# Patient Record
Sex: Male | Born: 1960
Health system: Southern US, Community
[De-identification: ages and names within clinical notes are randomized; demographics above are authoritative.]

## PROBLEM LIST (undated history)

## (undated) DIAGNOSIS — E785 Hyperlipidemia, unspecified: Secondary | ICD-10-CM

## (undated) DIAGNOSIS — I509 Heart failure, unspecified: Secondary | ICD-10-CM

## (undated) DIAGNOSIS — I10 Essential (primary) hypertension: Secondary | ICD-10-CM

## (undated) DIAGNOSIS — I6529 Occlusion and stenosis of unspecified carotid artery: Secondary | ICD-10-CM

## (undated) DIAGNOSIS — Z8673 Personal history of transient ischemic attack (TIA), and cerebral infarction without residual deficits: Secondary | ICD-10-CM

## (undated) DIAGNOSIS — E119 Type 2 diabetes mellitus without complications: Secondary | ICD-10-CM

## (undated) DIAGNOSIS — Z720 Tobacco use: Secondary | ICD-10-CM

## (undated) DIAGNOSIS — Z8659 Personal history of other mental and behavioral disorders: Secondary | ICD-10-CM

## (undated) DIAGNOSIS — B1920 Unspecified viral hepatitis C without hepatic coma: Secondary | ICD-10-CM

## (undated) DIAGNOSIS — I639 Cerebral infarction, unspecified: Secondary | ICD-10-CM

## (undated) HISTORY — DX: Tobacco use: Z72.0

## (undated) HISTORY — DX: Personal history of transient ischemic attack (TIA), and cerebral infarction without residual deficits: Z86.73

## (undated) HISTORY — DX: Hyperlipidemia, unspecified: E78.5

## (undated) HISTORY — PX: NO PAST SURGERIES: SHX2092

## (undated) HISTORY — DX: Essential (primary) hypertension: I10

## (undated) HISTORY — DX: Personal history of other mental and behavioral disorders: Z86.59

## (undated) HISTORY — DX: Occlusion and stenosis of unspecified carotid artery: I65.29

## (undated) HISTORY — DX: Heart failure, unspecified: I50.9

---
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
## 2000-07-16 ENCOUNTER — Emergency Department (HOSPITAL_COMMUNITY): Admission: EM | Admit: 2000-07-16 | Discharge: 2000-07-16 | Payer: Self-pay | Admitting: Emergency Medicine

## 2000-07-16 ENCOUNTER — Encounter: Payer: Self-pay | Admitting: Emergency Medicine

## 2000-08-07 ENCOUNTER — Encounter: Admission: RE | Admit: 2000-08-07 | Discharge: 2000-08-07 | Payer: Self-pay | Admitting: Internal Medicine

## 2001-08-13 ENCOUNTER — Emergency Department (HOSPITAL_COMMUNITY): Admission: EM | Admit: 2001-08-13 | Discharge: 2001-08-13 | Payer: Self-pay | Admitting: Emergency Medicine

## 2001-12-11 ENCOUNTER — Emergency Department (HOSPITAL_COMMUNITY): Admission: EM | Admit: 2001-12-11 | Discharge: 2001-12-11 | Payer: Self-pay | Admitting: Emergency Medicine

## 2008-01-23 ENCOUNTER — Emergency Department (HOSPITAL_COMMUNITY): Admission: EM | Admit: 2008-01-23 | Discharge: 2008-01-23 | Payer: Self-pay | Admitting: Emergency Medicine

## 2010-02-06 DIAGNOSIS — Z8673 Personal history of transient ischemic attack (TIA), and cerebral infarction without residual deficits: Secondary | ICD-10-CM | POA: Insufficient documentation

## 2010-02-06 HISTORY — DX: Personal history of transient ischemic attack (TIA), and cerebral infarction without residual deficits: Z86.73

## 2010-02-26 ENCOUNTER — Inpatient Hospital Stay (HOSPITAL_COMMUNITY): Admission: EM | Admit: 2010-02-26 | Discharge: 2010-03-02 | Payer: Self-pay | Admitting: Emergency Medicine

## 2010-03-01 ENCOUNTER — Encounter (INDEPENDENT_AMBULATORY_CARE_PROVIDER_SITE_OTHER): Payer: Self-pay | Admitting: Internal Medicine

## 2010-03-05 ENCOUNTER — Emergency Department (HOSPITAL_COMMUNITY): Admission: EM | Admit: 2010-03-05 | Discharge: 2010-03-05 | Payer: Self-pay | Admitting: Emergency Medicine

## 2010-04-14 ENCOUNTER — Encounter
Admission: RE | Admit: 2010-04-14 | Discharge: 2010-05-06 | Payer: Self-pay | Source: Home / Self Care | Attending: Internal Medicine | Admitting: Internal Medicine

## 2010-05-06 ENCOUNTER — Encounter
Admission: RE | Admit: 2010-05-06 | Discharge: 2010-06-08 | Payer: Self-pay | Source: Home / Self Care | Attending: Internal Medicine | Admitting: Internal Medicine

## 2010-05-09 DIAGNOSIS — I639 Cerebral infarction, unspecified: Secondary | ICD-10-CM

## 2010-05-09 HISTORY — DX: Cerebral infarction, unspecified: I63.9

## 2010-06-01 ENCOUNTER — Inpatient Hospital Stay (HOSPITAL_COMMUNITY)
Admission: EM | Admit: 2010-06-01 | Discharge: 2010-06-03 | Payer: Self-pay | Source: Home / Self Care | Attending: Internal Medicine | Admitting: Internal Medicine

## 2010-06-02 LAB — COMPREHENSIVE METABOLIC PANEL
AST: 25 U/L (ref 0–37)
Albumin: 4.4 g/dL (ref 3.5–5.2)
Alkaline Phosphatase: 63 U/L (ref 39–117)
Alkaline Phosphatase: 56 U/L (ref 39–117)
BUN: 6 mg/dL (ref 6–23)
BUN: 6 mg/dL (ref 6–23)
CO2: 26 mEq/L (ref 19–32)
Chloride: 99 mEq/L (ref 96–112)
Creatinine, Ser: 0.87 mg/dL (ref 0.4–1.5)
Creatinine, Ser: 0.93 mg/dL (ref 0.4–1.5)
GFR calc non Af Amer: >60 mL/min (ref >60)
Glucose, Bld: 197 mg/dL — ABNORMAL HIGH (ref 70–99)
Potassium: 3.3 mEq/L — ABNORMAL LOW (ref 3.5–5.1)
Potassium: 3.4 mEq/L — ABNORMAL LOW (ref 3.5–5.1)
Total Bilirubin: 0.4 mg/dL (ref 0.3–1.2)
Total Protein: 7.2 g/dL (ref 6.0–8.3)

## 2010-06-02 LAB — URINALYSIS, ROUTINE W REFLEX MICROSCOPIC
Bilirubin Urine: NEGATIVE
Hgb urine dipstick: NEGATIVE
Ketones, ur: NEGATIVE mg/dL
Specific Gravity, Urine: 1.001 — ABNORMAL LOW (ref 1.005–1.030)
Urine Glucose, Fasting: NEGATIVE mg/dL
pH: 6.5 (ref 5.0–8.0)

## 2010-06-02 LAB — CBC
HCT: 41 % (ref 39.0–52.0)
Hemoglobin: 13 g/dL (ref 13.0–17.0)
MCH: 31.1 pg (ref 26.0–34.0)
MCHC: 34.3 g/dL (ref 30.0–36.0)
MCV: 89.1 fL (ref 78.0–100.0)
Platelets: 275 10*3/uL (ref 150–400)
RBC: 4.24 MIL/uL (ref 4.22–5.81)
RDW: 13.2 % (ref 11.5–15.5)
WBC: 8.6 10*3/uL (ref 4.0–10.5)
WBC: 5.2 10*3/uL (ref 4.0–10.5)

## 2010-06-02 LAB — CARDIAC PANEL(CRET KIN+CKTOT+MB+TROPI)
CK, MB: 1.2 ng/mL (ref 0.3–4.0)
Total CK: 160 U/L (ref 7–232)
Troponin I: <0.01 ng/mL (ref 0.00–0.06)

## 2010-06-02 LAB — DIFFERENTIAL
Eosinophils Absolute: 0.1 10*3/uL (ref 0.0–0.7)
Eosinophils Relative: 2 % (ref 0–5)
Lymphocytes Relative: 40 % (ref 12–46)
Lymphs Abs: 3.4 10*3/uL (ref 0.7–4.0)
Monocytes Absolute: 0.5 10*3/uL (ref 0.1–1.0)
Monocytes Relative: 6 % (ref 3–12)

## 2010-06-02 LAB — HEMOGLOBIN A1C
Hgb A1c MFr Bld: 6 % — ABNORMAL HIGH (ref <5.7)
Mean Plasma Glucose: 126 mg/dL — ABNORMAL HIGH (ref <117)

## 2010-06-02 LAB — LIPID PANEL
Cholesterol: 160 mg/dL (ref 0–200)
HDL: 32 mg/dL — ABNORMAL LOW (ref >39)
LDL Cholesterol: 101 mg/dL — ABNORMAL HIGH (ref 0–99)
Total CHOL/HDL Ratio: 5 RATIO
Triglycerides: 135 mg/dL (ref <150)

## 2010-06-02 LAB — RAPID URINE DRUG SCREEN, HOSP PERFORMED
Amphetamines: NOT DETECTED
Cocaine: NOT DETECTED
Opiates: NOT DETECTED
Tetrahydrocannabinol: NOT DETECTED

## 2010-06-02 LAB — MAGNESIUM: Magnesium: 1.9 mg/dL (ref 1.5–2.5)

## 2010-06-02 LAB — GLUCOSE, CAPILLARY

## 2010-06-02 LAB — CK TOTAL AND CKMB (NOT AT ARMC): CK, MB: 1.8 ng/mL (ref 0.3–4.0)

## 2010-06-02 LAB — APTT: aPTT: 30 seconds (ref 24–37)

## 2010-06-02 LAB — POCT CARDIAC MARKERS

## 2010-06-03 ENCOUNTER — Encounter: Admit: 2010-06-03 | Payer: Self-pay | Admitting: Internal Medicine

## 2010-06-03 LAB — GLUCOSE, CAPILLARY: Glucose-Capillary: 113 mg/dL — ABNORMAL HIGH (ref 70–99)

## 2010-06-03 LAB — BASIC METABOLIC PANEL
CO2: 29 mEq/L (ref 19–32)
Glucose, Bld: 124 mg/dL — ABNORMAL HIGH (ref 70–99)
Potassium: 3.8 mEq/L (ref 3.5–5.1)
Sodium: 142 mEq/L (ref 135–145)

## 2010-06-04 ENCOUNTER — Encounter: Admit: 2010-06-04 | Payer: Self-pay | Admitting: Internal Medicine

## 2010-06-09 NOTE — H&P (Signed)
NAME:  MICHAE, GRIMLEY NO.:  [id number]  MEDICAL RECORD NO.:  [id number]          PATIENT TYPE:  EMS  LOCATION:  MAJO                         FACILITY:  [facility]  PHYSICIAN:  Eduard Clos, MDDATE OF BIRTH:  [birth year]  DATE OF ADMISSION:  06/01/2010 DATE OF DISCHARGE:                             HISTORY & PHYSICAL   PRIMARY CARE PHYSICIAN:  [facility].  CHIEF COMPLAINT:  Right-sided weakness.  HISTORY OF PRESENT ILLNESS:  This is a 50 year old male with history of CVA in October 2011, at that time I had some residual right-sided upper and lower extremity weakness, was doing fine and around 6:00 p.m. today was feeling uneasy, uncomfortable had some cold, chills, and shivers and experienced some weakness over the right upper extremity more than usual and also he said he had difficulty walking with this, having to drag his legs.  Along with that, he also had difficulty swallowing and also talking.  The whole incident lasted around less than an hour, but more than half an hour.  He was brought to the ER, in the ER the patient was found to have high blood pressure and also had some residual right-sided weakness which he did have previously.  CT head was negative.  At this time, the patient admitted for further workup.  Dr. Debarah Crape of Neurology was consulted by the ER physician.  At this time, Dr. Debarah Crape has advised the patient is not a candidate for TPA, as the patient has a recent CVA and more over his symptoms are improving.  To admit him under hospitalist service.  Get an MRI of the brain.  The patient denies any chest pain, shortness of breath, nausea, vomiting, abdominal pain, dysuria, discharge, diarrhea.  Denies any visual symptoms.  Have some mild headache all over the head which is improving.  The patient has no symptoms on the left upper lower extremity.  At this time his right upper and lower extremity symptoms have almost come back to baseline.  PAST  MEDICAL HISTORY:  CVA in October 2011, hypertension, tobacco abuse, hyperlipidemia.  PAST SURGICAL HISTORY:  None.  MEDICATIONS ON ADMISSION:  He states that he still takes same medicine. He was discharged last time which includes. 1. Clonidine 0.1 mg p.o. twice daily. 2. Lovastatin 20 mg daily. 3. Zolpidem 5 mg as needed. 4. Amlodipine 10 mg daily. 5. Aspirin 325 daily.  ALLERGIES:  ACE INHIBITORS and ARB.  FAMILY HISTORY:  The patient's mom having hypertension and diabetes. Coronary disease in mom and leukemia in his dad.  SOCIAL HISTORY:  The patient smokes cigarettes and advised to quit smoking.  Denies any alcohol abuse.  I have abused marijuana occasionally.  Denies any IV drug abuse.  REVIEW OF SYSTEMS:  As per the history of presenting illness, nothing else significant.  PHYSICAL EXAMINATION:  GENERAL:  The patient examined at bedside, not in acute distress. VITAL SIGNS:  Blood pressure is 160/98, pulse is 96, temperature 97.8, respirations 18, O2 sat 100%. HEENT:  Anicteric.  No pallor.  No facial asymmetry.  Tongue is midline. No neck rigidity.  No discharge from ears, eyes, nose, or mouth.  PLA positive. CHEST:  Bilateral air entry present.  No rhonchi.  No crepitation. HEART:  S1 and S2 heard. GI:  Abdomen is soft, nontender.  Bowel sounds heard. CNS: The patient is alert, awake, oriented to time, place, and person. The patient has strength on the right upper extremity around 3-4/5. Strength in left upper extremity is 5/5.  The right lower extremity strength is 5/5.  Left lower extremity strength is 5/5.  There is no dysdiadochokinesia or ataxia. EXTREMITIES:  Peripheral pulses felt.  No edema.  LABORATORY DATA:  EKG shows normal sinus rhythm with nonspecific ST-T changes.  Heart rate is around 96 beats per minute with posterior AV block.  Chest x-ray shows nothing acute.  CT of the head without contrast shows no acute findings, old white matter infarction  within the left centrum semiovale right frontal and a point sinusitis present last October as well.  CBC; WBC is 8.6, hemoglobin is 14.8, hematocrit is 41, platelets 275.  PT/INR are 13.3 and 0.9.  CBG is 216.  Complete metabolic panel; sodium 139, potassium 3.3, chloride 99, carbon dioxide 26, glucose 197, BUN 6, creatinine 0.8.  Total bilirubin is 0.4, alkaline phosphatase is 63, AST is 25, ALT is 31, total protein is 8, albumin is 4.4, calcium is 9.5.  CK is 119, MB is 1.8, troponin less than 0.01, and troponin I is less than 0.05.  Myoglobin is 71.9.  Drug screen is negative.  ASSESSMENT: 1. Right-sided weakness with difficulty talking and swallowing which     is improved at this time to rule out cerebrovascular accident     versus transient ischemic attack. 2. Accelerated hypertension. 3. Hyperglycemia concerning for the diabetes mellitus type 2. 4. Hyperlipidemia. 5. Ongoing tobacco abuse. 6. History of marijuana abuse.  PLAN: 1. At this time admit the patient to the telemetry. 2. I have discussed with on-call neurologist, Dr. Debarah Crape who was advised     MRI of the brain.  The patient is refusing MRI of the brain even if     given Ativan for sedation, so at this time after discussing with     Dr. Debarah Crape we are going to do CT angio brain and neck.  The patient be     on neuro checks and to be getting swallow evaluation, given a     aspirin. 3. Accelerated hypertension.  We will continue his clonidine, but at     this time I want to increase 0.1 b.i.d. to t.i.d. p.r.n., labetalol     for blood pressure more than 180 systolic and continue his     amlodipine.  Given a stroke like symptoms.  We will not be too     aggressive in his blood pressure control for at least next 12-24     hours or until CVA is ruled out. 4. Hyperglycemia concerning for a diabetes mellitus type 2.  At this     time, the patient placed will be placed on a sliding scale.  We     will check a hemoglobin A1c.  If  he has been diagnosed as a     diabetes he will need diabetic     education. 5. Tobacco abuse and does not abuse counseling will be needed. 6. Further recommendation and condition were based on tests.     Eduard Clos, MD     ANK/MEDQ  D:  06/01/2010  T:  06/01/2010  Job:  [id number]  Electronically Signed by Midge Minium MD on  06/09/2010 10:04:34 AM

## 2010-06-11 ENCOUNTER — Encounter: Payer: Self-pay | Admitting: Rehabilitative and Restorative Service Providers"

## 2010-06-11 ENCOUNTER — Encounter: Payer: Self-pay | Admitting: Occupational Therapy

## 2010-06-16 ENCOUNTER — Encounter: Payer: Self-pay | Admitting: Occupational Therapy

## 2010-07-12 NOTE — Consult Note (Signed)
NAME:  Ronald Marsh, Ronald Marsh              ACCOUNT NO.:  [id number]  MEDICAL RECORD NO.:  [id number]          PATIENT TYPE:  INP  LOCATION:  3023                         FACILITY:  [facility]  PHYSICIAN:  [facility]      DATE OF BIRTH:  10-28-1960  DATE OF CONSULTATION:  06/02/2010 DATE OF DISCHARGE:                                CONSULTATION   CHIEF COMPLAINT:  Abnormal sensation on the right side of the body with right arm and leg spasm for 30 minutes.  HISTORY OF PRESENT ILLNESS:  A 50 year old male with hypertension, prediabetes, left periventricular subcortical ischemic infarction in October 2011 with residual right upper and lower extremity weakness and who was living at home, attending outpatient physical therapy.  On June 01, 2009, at around 6 o'clock p.m., the patient was feeling uneasy, felt cold sensation on the right side of his body and felt his right hand fallen to assist.  He also felt spasm in his right lower extremity.  This lasted for approximately 30 minutes.  He was brought to the emergency room, found to have elevated blood pressure, and admitted for further workup.  The patient was recommend to have MRI scan of the brain, however, he declined.  Presently, the patient feels back to his normal state of health.  He did have a CT angiogram of the head and neck, which I reviewed.  PAST MEDICAL HISTORY: 1. Left periventricular subcortical ischemic infarction in October     2011. 2. Hypertension. 3. Hyperlipidemia. 4. Prediabetes. 5. Tobacco abuse.  PAST SURGICAL HISTORY:  None.  HOME MEDICATIONS:  Clonidine 0.1 mg twice a day, lovastatin 20 mg daily, zolpidem 5 mg as needed, amlodipine 10 mg daily, and aspirin 325 mg daily.  ALLERGIES:  ACE inhibitors and ARBs.  FAMILY HISTORY:  Hypertension, diabetes, coronary artery disease, and leukemia.  SOCIAL HISTORY:  The patient continues to smoke cigarettes.  Denies alcohol use.  Occasionally uses marijuana.   Denies illicit drug use.  REVIEW OF SYSTEMS:  As per HPI.  Presently denies chest pain, shortness of breath, nausea, vomiting, visual changes, new weakness compared to prior baseline stroke symptoms.  PHYSICAL EXAMINATION:  VITAL SIGNS:  Blood pressure 143/87, pulse 75, respirations 18, and temperature 98.2. GENERAL:  Awake, alert, and oriented.  Speech is fluent.  Comprehension intact. HEENT:  Pupils are reactive for 3-2 mm, mildly decreased right nasolabial fold.  Extraocular muscle movements intact.  There is mild dysarthria.  Facial sensation and strength symmetric except for mild decreased right nasolabial fold.  Uvula is midline.  Decreased shoulder movement on the right.  Left shoulder strength normal.  Tongue is midline. MOTOR:  Increased tone in the right upper and right lower extremity. The right upper extremity has 0/5 movement proximally and 1-2/5 distally.  Right lower extremity is 4/5 proximally and 2/5 distally. Left side strength examination is normal.  No ataxia in the left upper extremity, mildly decreased sensation to light touch in the right upper and right lower extremity. REFLEXES:  Hyperreflexia of the right upper and right lower extremity compared to the left, 3+ 2 in the left side. CARDIOVASCULAR:  Regular  rate and rhythm.  No murmurs, no carotid bruits.  LABORATORY DATA:  Triglycerides 135, cholesterol 160, HDL 52, and LDL 101.  Urine drug screen negative.  Hemoglobin A1c 6.0.  UA is negative. Sodium 140, BUN 6, creatinine 0.9, and glucose 197.  White count 5.2 and platelets 256.  IMAGING DATA:  CT scan of the head shows no acute findings, this show chronic left subcortical ischemic infarction.  CT angiogram of the head and neck shows mild plaque in the bilateral internal carotid arteries bifurcations.  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  Stroke workup  from October 2011, 2-D transthoracic echocardiogram shows ejection fraction of 50-55%.  No wall motion abnormalities.  Carotid Doppler shows no significant internal carotid artery stenosis.  ASSESSMENT:  A 50 year old male with left periventricular subcortical ischemia and infarction in October 2011 with residual right upper and right lower extremity weakness.  Now with abnormal sensation in the right upper and lower extremity and muscle spasm lasting for 30 minutes. Differential diagnosis include exacerbation of prior stroke symptoms, new stroke, transient ischemic attack or seizure (although extremely unlikely).  RECOMMENDATIONS: 1. Continue medical management of blood pressure,     hypercholesterolemia, and prediabetes. 2. Continue aspirin 325 mg per day. 3. MRI of the brain if the patient agrees. 4. If the patient declines MRI of the brain, okay to discharge home     from neurologic standpoint and needs a follow-up in Neurology     clinic with Dr. Pearlean Brownie.     Joycelyn Schmid, MD   ______________________________ [facility]    VP/MEDQ  D:  06/02/2010  T:  06/02/2010  Job:  [id number]  Electronically Signed by Joycelyn Schmid  on 07/12/2010 12:19:50 PM

## 2010-07-21 LAB — BASIC METABOLIC PANEL WITH GFR
GFR calc Af Amer: >60 mL/min (ref >60)
GFR calc non Af Amer: >60 mL/min (ref >60)
Potassium: 3.2 meq/L — ABNORMAL LOW (ref 3.5–5.1)
Sodium: 138 meq/L (ref 135–145)

## 2010-07-21 LAB — DIFFERENTIAL
Basophils Absolute: 0 10*3/uL (ref 0.0–0.1)
Basophils Absolute: 0 10*3/uL (ref 0.0–0.1)
Basophils Relative: 0 % (ref 0–1)
Basophils Relative: 1 % (ref 0–1)
Eosinophils Absolute: 0.1 10*3/uL (ref 0.0–0.7)
Eosinophils Relative: 1 % (ref 0–5)
Eosinophils Relative: 3 % (ref 0–5)
Lymphocytes Relative: 31 % (ref 12–46)
Lymphocytes Relative: 40 % (ref 12–46)
Lymphs Abs: 1.8 10*3/uL (ref 0.7–4.0)
Monocytes Absolute: 0.5 K/uL (ref 0.1–1.0)
Monocytes Relative: 8 % (ref 3–12)
Neutro Abs: 2.2 10*3/uL (ref 1.7–7.7)
Neutro Abs: 3.4 K/uL (ref 1.7–7.7)
Neutrophils Relative %: 60 % (ref 43–77)

## 2010-07-21 LAB — BASIC METABOLIC PANEL
BUN: 6 mg/dL (ref 6–23)
BUN: 7 mg/dL (ref 6–23)
BUN: 9 mg/dL (ref 6–23)
CO2: 26 mEq/L (ref 19–32)
Calcium: 9.1 mg/dL (ref 8.4–10.5)
Chloride: 104 mEq/L (ref 96–112)
Chloride: 108 mEq/L (ref 96–112)
Creatinine, Ser: 0.89 mg/dL (ref 0.4–1.5)
Creatinine, Ser: 0.9 mg/dL (ref 0.4–1.5)
GFR calc Af Amer: >60 mL/min (ref >60)
GFR calc non Af Amer: >60 mL/min (ref >60)
GFR calc non Af Amer: >60 mL/min (ref >60)
GFR calc non Af Amer: >60 mL/min (ref >60)
Glucose, Bld: 103 mg/dL — ABNORMAL HIGH (ref 70–99)
Glucose, Bld: 137 mg/dL — ABNORMAL HIGH (ref 70–99)
Potassium: 3.4 mEq/L — ABNORMAL LOW (ref 3.5–5.1)
Potassium: 3.9 mEq/L (ref 3.5–5.1)
Sodium: 140 mEq/L (ref 135–145)

## 2010-07-21 LAB — CK TOTAL AND CKMB (NOT AT ARMC)
CK, MB: 6.4 ng/mL (ref 0.3–4.0)
Relative Index: 0.8 (ref 0.0–2.5)
Total CK: 796 U/L — ABNORMAL HIGH (ref 7–232)

## 2010-07-21 LAB — CBC
HCT: 38.8 % — ABNORMAL LOW (ref 39.0–52.0)
HCT: 41.3 % (ref 39.0–52.0)
Hemoglobin: 13.3 g/dL (ref 13.0–17.0)
MCH: 30.3 pg (ref 26.0–34.0)
MCH: 30.6 pg (ref 26.0–34.0)
MCHC: 34 g/dL (ref 30.0–36.0)
MCHC: 34.3 g/dL (ref 30.0–36.0)
MCHC: 34.8 g/dL (ref 30.0–36.0)
MCV: 89 fL (ref 78.0–100.0)
MCV: 89.4 fL (ref 78.0–100.0)
MCV: 90.4 fL (ref 78.0–100.0)
MCV: 91.6 fL (ref 78.0–100.0)
Platelets: 252 10*3/uL (ref 150–400)
Platelets: 264 10*3/uL (ref 150–400)
Platelets: 275 10*3/uL (ref 150–400)
Platelets: 280 10*3/uL (ref 150–400)
RBC: 4.34 MIL/uL (ref 4.22–5.81)
RBC: 4.51 MIL/uL (ref 4.22–5.81)
RDW: 13.2 % (ref 11.5–15.5)
RDW: 13.3 % (ref 11.5–15.5)
RDW: 13.6 % (ref 11.5–15.5)
RDW: 13.6 % (ref 11.5–15.5)
WBC: 5 10*3/uL (ref 4.0–10.5)
WBC: 5.2 10*3/uL (ref 4.0–10.5)
WBC: 5.7 K/uL (ref 4.0–10.5)
WBC: 5.8 10*3/uL (ref 4.0–10.5)

## 2010-07-21 LAB — URINE DRUGS OF ABUSE SCREEN W ALC, ROUTINE (REF LAB)
Amphetamine Screen, Ur: NEGATIVE
Barbiturate Quant, Ur: NEGATIVE
Benzodiazepines.: NEGATIVE
Creatinine,U: 34.1 mg/dL
Ethyl Alcohol: <10 mg/dL (ref <10)
Marijuana Metabolite: POSITIVE — AB
Opiate Screen, Urine: NEGATIVE

## 2010-07-21 LAB — COMPREHENSIVE METABOLIC PANEL
ALT: 27 U/L (ref 0–53)
Alkaline Phosphatase: 57 U/L (ref 39–117)
Chloride: 104 mEq/L (ref 96–112)
Glucose, Bld: 107 mg/dL — ABNORMAL HIGH (ref 70–99)
Potassium: 3.4 mEq/L — ABNORMAL LOW (ref 3.5–5.1)
Sodium: 140 mEq/L (ref 135–145)
Total Protein: 7.6 g/dL (ref 6.0–8.3)

## 2010-07-21 LAB — COMPREHENSIVE METABOLIC PANEL WITH GFR
AST: 32 U/L (ref 0–37)
Albumin: 4.2 g/dL (ref 3.5–5.2)
BUN: 6 mg/dL (ref 6–23)
CO2: 27 meq/L (ref 19–32)
Calcium: 9.1 mg/dL (ref 8.4–10.5)
Creatinine, Ser: 0.96 mg/dL (ref 0.4–1.5)
GFR calc Af Amer: >60 mL/min (ref >60)
GFR calc non Af Amer: >60 mL/min (ref >60)
Total Bilirubin: 0.7 mg/dL (ref 0.3–1.2)

## 2010-07-21 LAB — PROTIME-INR
INR: 1.04 (ref 0.00–1.49)
Prothrombin Time: 13.8 seconds (ref 11.6–15.2)

## 2010-07-21 LAB — GLUCOSE, CAPILLARY: Glucose-Capillary: 99 mg/dL (ref 70–99)

## 2010-07-21 LAB — HEMOGLOBIN A1C
Hgb A1c MFr Bld: 6.1 % — ABNORMAL HIGH (ref <5.7)
Mean Plasma Glucose: 128 mg/dL — ABNORMAL HIGH (ref <117)

## 2010-07-21 LAB — PHOSPHORUS
Phosphorus: 3.5 mg/dL (ref 2.3–4.6)
Phosphorus: 3.7 mg/dL (ref 2.3–4.6)

## 2010-07-21 LAB — APTT: aPTT: 33 s (ref 24–37)

## 2010-07-21 LAB — MAGNESIUM: Magnesium: 2.2 mg/dL (ref 1.5–2.5)

## 2010-07-21 LAB — THC (MARIJUANA), URINE, CONFIRMATION: Marijuana, Ur-Confirmation: 48 NG/ML — ABNORMAL HIGH

## 2010-07-21 LAB — LIPID PANEL: Cholesterol: 201 mg/dL — ABNORMAL HIGH (ref 0–200)

## 2010-07-21 LAB — TROPONIN I: Troponin I: 0.01 ng/mL (ref 0.00–0.06)

## 2010-08-21 ENCOUNTER — Emergency Department (HOSPITAL_COMMUNITY): Payer: Medicaid Other

## 2010-08-21 ENCOUNTER — Emergency Department (HOSPITAL_COMMUNITY)
Admission: EM | Admit: 2010-08-21 | Discharge: 2010-08-21 | Disposition: A | Discharge status: Home / Self Care | Payer: Medicaid Other | Attending: Emergency Medicine | Admitting: Emergency Medicine

## 2010-08-21 DIAGNOSIS — M6281 Muscle weakness (generalized): Secondary | ICD-10-CM | POA: Insufficient documentation

## 2010-08-21 DIAGNOSIS — H5789 Other specified disorders of eye and adnexa: Secondary | ICD-10-CM | POA: Insufficient documentation

## 2010-08-21 DIAGNOSIS — E789 Disorder of lipoprotein metabolism, unspecified: Secondary | ICD-10-CM | POA: Insufficient documentation

## 2010-08-21 DIAGNOSIS — Z79899 Other long term (current) drug therapy: Secondary | ICD-10-CM | POA: Insufficient documentation

## 2010-08-21 DIAGNOSIS — R110 Nausea: Secondary | ICD-10-CM | POA: Insufficient documentation

## 2010-08-21 DIAGNOSIS — R51 Headache: Secondary | ICD-10-CM | POA: Insufficient documentation

## 2010-08-21 DIAGNOSIS — I10 Essential (primary) hypertension: Secondary | ICD-10-CM | POA: Insufficient documentation

## 2010-08-21 DIAGNOSIS — Z8673 Personal history of transient ischemic attack (TIA), and cerebral infarction without residual deficits: Secondary | ICD-10-CM | POA: Insufficient documentation

## 2010-08-21 DIAGNOSIS — R259 Unspecified abnormal involuntary movements: Secondary | ICD-10-CM | POA: Insufficient documentation

## 2010-08-21 DIAGNOSIS — J309 Allergic rhinitis, unspecified: Secondary | ICD-10-CM | POA: Insufficient documentation

## 2010-08-21 LAB — BASIC METABOLIC PANEL
BUN: 8 mg/dL (ref 6–23)
Calcium: 9.1 mg/dL (ref 8.4–10.5)
Creatinine, Ser: 0.91 mg/dL (ref 0.4–1.5)
GFR calc non Af Amer: >60 mL/min (ref >60)
Glucose, Bld: 256 mg/dL — ABNORMAL HIGH (ref 70–99)
Sodium: 135 mEq/L (ref 135–145)

## 2010-08-21 LAB — CBC
MCHC: 35.4 g/dL (ref 30.0–36.0)
Platelets: 270 10*3/uL (ref 150–400)
RDW: 13.5 % (ref 11.5–15.5)
WBC: 5.8 10*3/uL (ref 4.0–10.5)

## 2010-08-21 LAB — DIFFERENTIAL
Basophils Absolute: 0 10*3/uL (ref 0.0–0.1)
Basophils Relative: 1 % (ref 0–1)
Eosinophils Absolute: 0.3 10*3/uL (ref 0.0–0.7)
Eosinophils Relative: 5 % (ref 0–5)
Lymphocytes Relative: 30 % (ref 12–46)
Monocytes Absolute: 0.7 10*3/uL (ref 0.1–1.0)

## 2010-12-05 ENCOUNTER — Emergency Department (HOSPITAL_COMMUNITY)
Admission: EM | Admit: 2010-12-05 | Discharge: 2010-12-06 | Disposition: A | Discharge status: Home / Self Care | Payer: No Typology Code available for payment source | Attending: Emergency Medicine | Admitting: Emergency Medicine

## 2010-12-05 DIAGNOSIS — T148XXA Other injury of unspecified body region, initial encounter: Secondary | ICD-10-CM | POA: Insufficient documentation

## 2010-12-05 DIAGNOSIS — I69998 Other sequelae following unspecified cerebrovascular disease: Secondary | ICD-10-CM | POA: Insufficient documentation

## 2010-12-05 DIAGNOSIS — Y92410 Unspecified street and highway as the place of occurrence of the external cause: Secondary | ICD-10-CM | POA: Insufficient documentation

## 2010-12-05 DIAGNOSIS — M6281 Muscle weakness (generalized): Secondary | ICD-10-CM | POA: Insufficient documentation

## 2010-12-05 DIAGNOSIS — I10 Essential (primary) hypertension: Secondary | ICD-10-CM | POA: Insufficient documentation

## 2010-12-05 DIAGNOSIS — S335XXA Sprain of ligaments of lumbar spine, initial encounter: Secondary | ICD-10-CM | POA: Insufficient documentation

## 2011-02-07 LAB — DIFFERENTIAL
Lymphocytes Relative: 24
Monocytes Absolute: 0.9
Monocytes Relative: 12
Neutro Abs: 4.8
Neutrophils Relative %: 63

## 2011-02-07 LAB — POCT I-STAT, CHEM 8
BUN: 7
Calcium, Ion: 1.11 — ABNORMAL LOW
Chloride: 98
Glucose, Bld: 129 — ABNORMAL HIGH
HCT: 50
Potassium: 3.1 — ABNORMAL LOW

## 2011-02-07 LAB — CBC
Hemoglobin: 16.1
RBC: 4.87
WBC: 7.6

## 2012-04-24 ENCOUNTER — Encounter: Payer: Self-pay | Admitting: Internal Medicine

## 2012-04-24 DIAGNOSIS — E785 Hyperlipidemia, unspecified: Secondary | ICD-10-CM | POA: Insufficient documentation

## 2012-04-24 DIAGNOSIS — I10 Essential (primary) hypertension: Secondary | ICD-10-CM | POA: Insufficient documentation

## 2012-04-24 DIAGNOSIS — Z720 Tobacco use: Secondary | ICD-10-CM | POA: Insufficient documentation

## 2012-04-24 DIAGNOSIS — I6529 Occlusion and stenosis of unspecified carotid artery: Secondary | ICD-10-CM | POA: Insufficient documentation

## 2012-04-25 ENCOUNTER — Encounter: Payer: Self-pay | Admitting: Internal Medicine

## 2012-04-25 ENCOUNTER — Ambulatory Visit (INDEPENDENT_AMBULATORY_CARE_PROVIDER_SITE_OTHER): Payer: Medicaid Other | Admitting: Internal Medicine

## 2012-04-25 VITALS — BP 139/89 | HR 61 | Temp 96.7°F | Ht 74.0 in | Wt 236.0 lb

## 2012-04-25 DIAGNOSIS — I10 Essential (primary) hypertension: Secondary | ICD-10-CM

## 2012-04-25 DIAGNOSIS — Z8673 Personal history of transient ischemic attack (TIA), and cerebral infarction without residual deficits: Secondary | ICD-10-CM

## 2012-04-25 DIAGNOSIS — M25511 Pain in right shoulder: Secondary | ICD-10-CM

## 2012-04-25 DIAGNOSIS — F17200 Nicotine dependence, unspecified, uncomplicated: Secondary | ICD-10-CM

## 2012-04-25 DIAGNOSIS — I6529 Occlusion and stenosis of unspecified carotid artery: Secondary | ICD-10-CM

## 2012-04-25 DIAGNOSIS — M2550 Pain in unspecified joint: Secondary | ICD-10-CM | POA: Insufficient documentation

## 2012-04-25 DIAGNOSIS — Z23 Encounter for immunization: Secondary | ICD-10-CM

## 2012-04-25 DIAGNOSIS — Z720 Tobacco use: Secondary | ICD-10-CM

## 2012-04-25 DIAGNOSIS — G8929 Other chronic pain: Secondary | ICD-10-CM

## 2012-04-25 DIAGNOSIS — Z0000 Encounter for general adult medical examination without abnormal findings: Secondary | ICD-10-CM | POA: Insufficient documentation

## 2012-04-25 DIAGNOSIS — E785 Hyperlipidemia, unspecified: Secondary | ICD-10-CM

## 2012-04-25 DIAGNOSIS — K5900 Constipation, unspecified: Secondary | ICD-10-CM | POA: Insufficient documentation

## 2012-04-25 DIAGNOSIS — R7303 Prediabetes: Secondary | ICD-10-CM | POA: Insufficient documentation

## 2012-04-25 DIAGNOSIS — R7309 Other abnormal glucose: Secondary | ICD-10-CM

## 2012-04-25 LAB — COMPREHENSIVE METABOLIC PANEL
ALT: 36 U/L (ref 0–53)
CO2: 28 mEq/L (ref 19–32)
Calcium: 9.5 mg/dL (ref 8.4–10.5)
Chloride: 103 mEq/L (ref 96–112)
Creat: 0.98 mg/dL (ref 0.50–1.35)
Glucose, Bld: 254 mg/dL — ABNORMAL HIGH (ref 70–99)
Sodium: 138 mEq/L (ref 135–145)
Total Bilirubin: 0.4 mg/dL (ref 0.3–1.2)
Total Protein: 7.9 g/dL (ref 6.0–8.3)

## 2012-04-25 LAB — CBC WITH DIFFERENTIAL/PLATELET
Basophils Absolute: 0 10*3/uL (ref 0.0–0.1)
Lymphocytes Relative: 32 % (ref 12–46)
Lymphs Abs: 2 10*3/uL (ref 0.7–4.0)
MCV: 90.9 fL (ref 78.0–100.0)
Neutro Abs: 3.5 10*3/uL (ref 1.7–7.7)
Neutrophils Relative %: 56 % (ref 43–77)
Platelets: 261 10*3/uL (ref 150–400)
RBC: 4.81 MIL/uL (ref 4.22–5.81)
RDW: 14 % (ref 11.5–15.5)
WBC: 6.2 10*3/uL (ref 4.0–10.5)

## 2012-04-25 LAB — LIPID PANEL
Cholesterol: 130 mg/dL (ref 0–200)
HDL: 26 mg/dL — ABNORMAL LOW (ref >39)
Total CHOL/HDL Ratio: 5 Ratio
Triglycerides: 363 mg/dL — ABNORMAL HIGH (ref <150)

## 2012-04-25 MED ORDER — AMLODIPINE BESYLATE 10 MG PO TABS: 10.0000 mg | ORAL_TABLET | Freq: Every day | ORAL | Status: DC

## 2012-04-25 MED ORDER — METOPROLOL TARTRATE 100 MG PO TABS: 150.0000 mg | ORAL_TABLET | Freq: Two times a day (BID) | ORAL | Status: DC

## 2012-04-25 MED ORDER — TRAMADOL HCL 50 MG PO TABS: 50.0000 mg | ORAL_TABLET | Freq: Four times a day (QID) | ORAL | Status: DC | PRN

## 2012-04-25 MED ORDER — CLONIDINE HCL 0.2 MG PO TABS: 0.2000 mg | ORAL_TABLET | Freq: Two times a day (BID) | ORAL | Status: DC

## 2012-04-25 MED ORDER — DOCUSATE SODIUM 100 MG PO CAPS: 100.0000 mg | ORAL_CAPSULE | Freq: Two times a day (BID) | ORAL | Status: DC

## 2012-04-25 NOTE — Assessment & Plan Note (Signed)
  Assessment: 1. The patient was counseled on the dangers of tobacco use, which include, but are not limited to cardiovascular disease, increased cancer risk of multiple types of cancer, COPD, peripheral vascular disease, strokes. 2. He was also counseled on the benefits of smoking cessation. 3. Progress toward smoking cessation:   smoking less 4. Barriers to progress toward smoking cessation:  Has been previously unable to afford medications for cessation  Plan: 1. Instruction/counseling given: The patient was firmly advised to quit and referred to a tobacco cessation program.   2. We also reviewed strategies to maximize success, including:  Removing cigarettes and smoking materials from environment  Stress management  Substitution of other forms of reinforcement Support of family/friends.  Selecting a quit date. 3. Educational resources provided:  Handout 4. Self management tools provided:   5. Medications to assist with smoking cessation: None 6. Patient agreed to the following self-care plans for smoking cessation: set a quit date and stop smoking

## 2012-04-25 NOTE — Assessment & Plan Note (Signed)
Assessment: The patient describes arthralgias in his right shoulder, right knee, ankle since the time of his stroke in 2011. There is no crepitation noted on exam. He has not have neurologic deficit noted on exam. Unclear if it may be contributed by overuse in the setting of initial gait instability after his stroke. Possible osteoarthritis. No red flag symptoms noted on exam  Plan:      Refill of tramadol given today.   We'll reevaluate at the next visit, may consider sports medicine referral in the future for consideration of ultrasound, and joint injections, or physical therapy.  Continue home exercises for now.

## 2012-04-25 NOTE — Patient Instructions (Signed)
General Instructions:  Please follow-up at the clinic in 3 months, at which time we will reevaluate your pain, blood pressure, progress towards quitting smoking - OR, please follow-up in the clinic sooner if needed.  There have been changes in your medications - and refills have been sent to your pharmacy.  Your Metoprolol dosage has changed - you are now on the 100mg  tablet that you should take 1.5 tablets twice daily.  You can stop your old Metoprolol tablets (that were 50mg  3 tablets twice daily)    You are getting labs today, if they are abnormal I will give you a call.   Please work on stopping smoking, see the information below about how to help with your blood pressure and tips for stopping smoking.  If you have been started on new medication(s), and you develop symptoms concerning for allergic reaction, including, but not limited to, throat closing, tongue swelling, rash, please stop the medication immediately and call the clinic at [phone], and go to the ER.  If symptoms worsen, or new symptoms arise, please call the clinic or go to the ER.   PLEASE BRING ALL OF YOUR MEDICATIONS  IN A BAG TO YOUR NEXT APPOINTMENT   Treatment Goals:  Goals (1 Years of Data) as of 04/25/2012    None      Progress Toward Treatment Goals:    Self Care Goals & Plans:  Self Care Goal 04/25/2012  Manage my medications take my medicines as prescribed; bring my medications to every visit; refill my medications on time  Monitor my health keep track of my blood pressure  Eat healthy foods eat baked foods instead of fried foods; eat foods that are low in salt; eat smaller portions; eat fruit for snacks and desserts  Be physically active take a walk every day  Stop smoking set a quit date and stop smoking       Care Management & Community Referrals: LIFESTYLE TIPS TO HELP WITH YOUR BLOOD PRESSURE CONTROL  WEIGHT REDUCTION:  Strategies: A healthy weight loss program includes:  A  calorie restricted diet based on individual calorie needs.   Increased physical activity (exercise).  An exercise program is just as important as the right low-calorie diet.    An unhealthy weight loss program includes:  Fasting.   Fad diets.   Supplements and drugs.  These choices do not succeed in long-term weight control.   Home Care Instructions: To help you make the needed dietary changes:   Exercise and perform physical activity as directed by your caregiver.   Keep a daily record of everything you eat. There are many free websites to help you with this. It may be helpful to measure your foods so you can determine if you are eating the correct portion sizes.   Use low-calorie cookbooks or take special cooking classes.   Avoid alcohol. Drink more water and drinks with no calories.   Take vitamins and supplements only as recommended by your caregiver.   Weight loss support groups, Registered Dieticians, counselors, and stress reduction education can also be very helpful.   ________________________________________________________________________  DASH DIET:  The DASH diet stands for "Dietary Approaches to Stop Hypertension." It is a healthy eating plan that has been shown to reduce high blood pressure (hypertension) in as little as 14 days, while also possibly providing other significant health benefits. These other health benefits include reducing the risk of breast cancer after menopause and reducing the risk of type 2 diabetes, heart disease,  colon cancer, and stroke. Health benefits also include weight loss and slowing kidney failure in patients with chronic kidney disease.   Diet guidelines: Limit salt (sodium). Your diet should contain less than 1500 mg of sodium daily.  Limit refined or processed carbohydrates. Your diet should include mostly whole grains. Desserts and added sugars should be used sparingly.  Include small amounts of heart-healthy fats. These types of  fats include nuts, oils, and tub margarine. Limit saturated and trans fats. These fats have been shown to be harmful in the body.   Choosing Foods: The following food groups are based on a 2000 calorie diet. See your Registered Dietitian for individual calorie needs.  Grains and Grain Products (6 to 8 servings daily)  Eat More Often: Whole-wheat bread, brown rice, whole-grain or wheat pasta, quinoa, popcorn without added fat or salt (air popped).  Eat Less Often: White bread, white pasta, white rice, cornbread.  Vegetables (4 to 5 servings daily)  Eat More Often: Fresh, frozen, and canned vegetables. Vegetables may be raw, steamed, roasted, or grilled with a minimal amount of fat.  Eat Less Often/Avoid: Creamed or fried vegetables. Vegetables in a cheese sauce.  Fruit (4 to 5 servings daily)  Eat More Often: All fresh, canned (in natural juice), or frozen fruits. Dried fruits without added sugar. One hundred percent fruit juice ( cup [237 mL] daily).  Eat Less Often: Dried fruits with added sugar. Canned fruit in light or heavy syrup.  [employer], Fish, and Poultry (2 servings or less daily. One serving is 3 to 4 oz [85-114 g]).  Eat More Often: Ninety percent or leaner ground beef, tenderloin, sirloin. Round cuts of beef, chicken breast, [place] breast. All fish. Grill, bake, or broil your meat. Nothing should be fried.  Eat Less Often/Avoid: Fatty cuts of meat, [place], or chicken leg, thigh, or wing. Fried cuts of meat or fish.  Dairy (2 to 3 servings)  Eat More Often: Low-fat or fat-free milk, low-fat plain or light yogurt, reduced-fat or part-skim cheese.  Eat Less Often/Avoid: Milk (whole, 2%, skim, or chocolate). Whole milk yogurt. Full-fat cheeses.  Nuts, Seeds, and Legumes (4 to 5 servings per week)  Eat More Often: All without added salt.  Eat Less Often/Avoid: Salted nuts and seeds, canned beans with added salt.  Fats and Sweets (limited)  Eat More Often: Vegetable oils, tub  margarines without trans fats, sugar-free gelatin. Mayonnaise and salad dressings.  Eat Less Often/Avoid: Coconut oils, palm oils, butter, stick margarine, cream, half and half, cookies, candy, pie.   ________________________________________________________________________  Smoking Cessation Tips 1-800-QUIT-NOW  This document explains the best ways for you to quit smoking and new treatments to help. It lists new medicines that can double or triple your chances of quitting and quitting for good. It also considers ways to avoid relapses and concerns you may have about quitting, including weight gain.   Nicotine: A Powerful Addiction If you have tried to quit smoking, you know how hard it can be. It is hard because nicotine is a very addictive drug. For some people, it can be as addictive as heroin or cocaine. Usually, people make 2 or 3 tries, or more, before finally being able to quit. Each time you try to quit, you can learn about what helps and what hurts. Quitting takes hard work and a lot of effort, but you can quit smoking.   Quitting smoking is one of the most important things you will ever do You will live longer,  feel better, and live better.  The impact on your body of quitting smoking is felt almost immediately:   Five keys to quitting: Studies have shown that these 5 steps will help you quit smoking and quit for good. You have the best chances of quitting if you use them together:   1. GET READY  Set a quit date.  Change your environment.  Get rid of ALL cigarettes, ashtrays, matches, and lighters in your home, car, and place of work.  Do not let people smoke in your home.  Review your past attempts to quit. Think about what worked and what did not.  Once you quit, do not smoke. NOT EVEN A PUFF!   2. GET SUPPORT AND ENCOURAGEMENT  Tell your family, friends, and coworkers that you are going to quit and need their support. Ask them not to smoke around you.  Get individual,  group, or telephone counseling and support.  Many smokers find one or more of the many self-help books available useful in helping them quit and stay off tobacco.   3. LEARN NEW SKILLS AND BEHAVIORS  Try to distract yourself from urges to smoke. Talk to someone, go for a walk, or occupy your time with a task.  When you first try to quit, change your routine. Take a different route to work. Drink tea instead of coffee. Eat breakfast in a different place.  Do something to reduce your stress. Take a hot bath, exercise, or read a book.  Plan something enjoyable to do every day. Reward yourself for not smoking.  Explore interactive web-based programs that specialize in helping you quit.   4. GET MEDICINE AND USE IT CORRECTLY .  Medicines can help you stop smoking and decrease the urge to smoke. Combining medicine with the above behavioral methods and support can quadruple your chances of successfully quitting smoking.  Talk with your doctor about these options.  5. BE PREPARED FOR RELAPSE OR DIFFICULT SITUATIONS  Most relapses occur within the first 3 months after quitting. Do not be discouraged if you start smoking again. Remember, most people try several times before they finally quit.  You may have symptoms of withdrawal because your body is used to nicotine. You may crave cigarettes, be irritable, feel very hungry, cough often, get headaches, or have difficulty concentrating.  The withdrawal symptoms are only temporary. They are strongest when you first quit, but they will go away within 10 to 14 days.   Quitting takes hard work and a lot of effort, but you can quit smoking.   FOR MORE INFORMATION  Smokefree.gov (http://www.davis-sullivan.com/) provides free, accurate, evidence-based information and professional assistance to help support the immediate and long-term needs of people trying to quit smoking.  Document Released: 04/19/2001 Document Re-Released: 10/13/2009  [facility] Patient Information  2011 [place], [place].

## 2012-04-25 NOTE — Assessment & Plan Note (Signed)
Assessment: Patient had left periventricular subcortical ischemic infarct in October 2011. States that he has residual right upper and lower extremity weakness for which he uses a cane. No weakness was noted on exam, however the patient indicates that typically he more easily fatigues on the right side after prolonged activity.  Plan:      Recommended to continue on aspirin 81 mg daily, significant emphasis was placed on this.  Will check lipid panel today.  Advised smoking cessation and emphasized importance of this particularly in the setting of him having already had a stroke at such a young age.

## 2012-04-25 NOTE — Assessment & Plan Note (Signed)
Pertinent Data: Hemoglobin A1c of 6.4 in February 2012  Assessment: Unfortunately, I only revealed this diagnosis after chart review (of his packet of papers that [facility]) after the patient's visit today.  Plan:      Will plan to check A1c likely next visit.

## 2012-04-25 NOTE — Assessment & Plan Note (Addendum)
Pertinent Data: BP Readings from Last 3 Encounters:  04/25/12 139/89    Basic Metabolic Panel:    Component Value Date/Time   NA 135 08/21/2010 0526   K 3.3* 08/21/2010 0526   CL 98 08/21/2010 0526   CO2 28 08/21/2010 0526   BUN 8 08/21/2010 0526   CREATININE 0.91 08/21/2010 0526   GLUCOSE 256* 08/21/2010 0526   CALCIUM 9.1 08/21/2010 0526    Assessment: Disease Control: controlled  Progress toward goals: at goal  Barriers to meeting goals: no barriers identified    Patient is compliant most of the time with prescribed medications.   Plan:  continue current medications - EXCEPT, will change Metoprolol to the 100mg  tablets to continue to take 150mg  BID (has been otherwise taking 3 of the 50mg  tablets twice daily).  Refills given for Amlodipine, Clonidine, and Metoprolol today.  Check CMET today.  Advised to stop smoking.  Educational resources provided:  Handout given regarding smoking cessation, low salt diet, weight loss.  Self management tools provided:

## 2012-04-25 NOTE — Assessment & Plan Note (Signed)
Assessment: Still passing gas. Likely at least partly contributed by Amlodipine usage (common side effect).  Plan:      Start colace 100mg  BID, then PRN once stools more regular.

## 2012-04-25 NOTE — Progress Notes (Signed)
Patient: Ronald Marsh   [name]: [id number]  DOB: 1961-05-06  PCP: No primary provider on file.   Subjective:    HPI: Ronald Marsh is a 51 y.o. 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 Currently taking amlodipine 10mg , clonidine 0.2mg  BID and metoprolol 150mg  BID. Patient misses doses 0 x per week on average. admits to occasional shortness of breath. Denies headaches, dizziness, lightheadedness, chest pain.  does request refills today.  2) HLD - currently taking Pravastatin 20mg . Patient misses doses 0 x per week on average. denies chest pain, difficulty breathing, palpitations, tachycardia, and muscle pains. does request refills today.   3) Chronic right sided pain -  Patient describes a 2 years history of unchanged, intermittent, aching pain that is located to right shoulder and will go down right arm, as well he has right knee and ankle pain chronically after his stroke. No shooting pains down back of legs or arms. No specific weakness immediately, however, after prolonged activity, will feel weakess to his right side overall. No recent inciting trauma, falls, car accidents. Has been tried on tylenol and NSAIDs without adequate control of the pain, and therefore, was escalated to tramadol. He states that the tramadol well controls his pain. Currently, the pain is rated a 8/10 in severity. At its worst, the pain is rated a 10/10 in severity. Aggravated by prolonged activity, standing, alleviated by tramadol.   4) Tobacco abuse - patient continues to smoke a few puffs of cigarettes per day, previously using 1/2 ppd before his stroke in 2011 and has done so for the past 31 years. Barriers to quit (if any) include: none   Review of Systems: Per HPI.   Current Outpatient  Medications: Medication Sig  . amLODipine (NORVASC) 10 MG tablet Take 10 mg by mouth daily.  Marland Kitchen aspirin 81 MG tablet Take 81 mg by mouth daily.  . cloNIDine (CATAPRES) 0.2 MG tablet Take 0.2 mg by mouth 2 (two) times daily.  Marland Kitchen loratadine (CLARITIN) 10 MG tablet Take 10 mg by mouth daily.  . metoprolol (LOPRESSOR) 50 MG tablet Take 150 mg by mouth 2 (two) times daily.  . pravastatin (PRAVACHOL) 20 MG tablet Take 20 mg by mouth daily.     Allergies  Allergen Reactions  . Ace Inhibitors Swelling  . Angiotensin Receptor Blockers Swelling    Past Medical History  Diagnosis Date  . Tobacco abuse   . Hypertension   . Hyperlipidemia   . History of CVA (cerebrovascular accident) 02/2010    Left periventricular subcortical ischemic infarction // Residual RUE and RLE weakness  . Internal carotid artery stenosis     BL and mild per CT angiogram (05/2010)  . History of depression     surrounding original stroke - has since resolved (04/2012)    Past Surgical History History reviewed. No pertinent past surgical history.   Family History  Problem Relation Age of Onset  . Hypertension Brother   . Diabetes Mother   . Heart failure Mother   . Hypertension Mother   . Hyperlipidemia Mother   . Lung cancer Father   . Diabetes Sister   . Arthritis Sister     Social History History   Social History  . Marital Status: Single    Spouse  Name: N/A    Number of Children: 1  . Years of Education: 11th grade   Occupational History  . On disability     previously worked for [employer] until his stroke in 2011.    Social History Main Topics  . Smoking status: Current Every Day Smoker -- 31 years    Types: Cigarettes    Start date: 04/25/1981  . Smokeless tobacco: Not on file     Comment: 1/2 ppd until 2011, now intermittently  . Alcohol Use: Yes     Comment: 1-2 beers approximately once a week  . Drug Use: No  . Sexually Active: Not on file   Other Topics Concern  . Not on  file   Social History Narrative   Lives in [place] with wife.Has been incarcerated multiple times, last time was in 2012 for 8 months.    Objective:    Physical Exam: Filed Vitals:   04/25/12 0909  BP: 139/89  Pulse: 61  Temp: 96.7 F (35.9 C)     General Exam:   Head: Normocephalic, atraumatic.  Eyes: No signs of anemia or jaundince.  Nose: Mucous membranes moist, not inflammed, nonerythematous.  Throat: Oropharynx nonerythematous, no exudate appreciated.   Neck: No deformities, masses, or tenderness noted. Supple, no JVD.  Lungs:  Normal respiratory effort. Clear to auscultation BL without crackles or wheezes.  Heart: RRR. S1 and S2 normal without gallop, murmur, or rubs.  Abdomen:  BS normoactive. Soft, Nondistended, non-tender.  No masses or organomegaly.  Extremities: No pretibial edema.  Skin: No visible rashes, scars.     Neurologic Exam:   Mental Status: Alert, oriented, thought content appropriate.  Speech fluent without evidence of aphasia. Able to follow 3 step commands without difficulty.  Cranial Nerves:   II: Visual fields grossly intact.  III/IV/VI: Extraocular movements intact.  Pupils reactive bilaterally.  V/VII: Smile symmetric. facial light touch sensation normal bilaterally.  VIII: Grossly intact.  IX/X: Normal gag.  XI: Bilateral shoulder shrug normal.  XII: Midline tongue extension normal.  Motor:  5/5 bilaterally with normal tone and bulk  Sensory:  Light touch intact throughout, bilaterally  Cerebellar: Some dysmetria noted on right side. On left, there is normal finger-to-nose, normal rapid alternating movements.    Assessment/ Plan:   Case and plan of care discussed with attending physician, Dr. Lars Mage.

## 2012-04-25 NOTE — Assessment & Plan Note (Signed)
Health Maintenance  Topic Date Due  . Tetanus/tdap  03/02/1982  . Lipid Panel  06/03/2011  . Influenza Vaccine  01/07/2013    Assessment:  Procedures due: None  Labs due: CMET, Lipid Panel  Immunizations due: Flu shot, possibly needs Tdap (patient thinks he had gotten his tetanus shot within the last 2-3 years)  Plan:  Labs today.  Flu shot today.  Will need to investigate and record if he has had a tetanus shot, if not, will plan to administer during next visit.

## 2012-04-25 NOTE — Assessment & Plan Note (Signed)
Pertinent Labs: Liver Function Tests:    Component Value Date/Time   AST 21 06/02/2010 0630   ALT 26 06/02/2010 0630   ALKPHOS 56 06/02/2010 0630   BILITOT 0.7 06/02/2010 0630   PROT 7.2 06/02/2010 0630   ALBUMIN 3.7 06/02/2010 0630    Lipid Panel:     Component Value Date/Time   CHOL 160       06/02/2010 0630   TRIG 135 06/02/2010 0630   HDL 32* 06/02/2010 0630   CHOLHDL 5.0 06/02/2010 0630   VLDL 27 06/02/2010 0630   LDLCALC 101 06/02/2010 0630     Assessment: Goal LDL (per ATP guidelines): < 100 mg/dL  Disease Control: controlled  Progress toward goals: unable to assess  Barriers to meeting goals: no barriers identified    Patient is fasting today.   Patient is compliant most of the time with prescribed medications.    Plan:  continue current medications  Check FLP today, adjust therapy as indicated.

## 2012-05-10 ENCOUNTER — Other Ambulatory Visit: Payer: Self-pay | Admitting: *Deleted

## 2012-05-10 ENCOUNTER — Other Ambulatory Visit: Payer: Self-pay | Admitting: Internal Medicine

## 2012-05-10 DIAGNOSIS — M25511 Pain in right shoulder: Secondary | ICD-10-CM

## 2012-05-10 DIAGNOSIS — G8929 Other chronic pain: Secondary | ICD-10-CM

## 2012-05-10 MED ORDER — TRAMADOL HCL 50 MG PO TABS: 50.0000 mg | ORAL_TABLET | Freq: Four times a day (QID) | ORAL | Status: DC | PRN

## 2012-05-10 MED ORDER — PRAVASTATIN SODIUM 20 MG PO TABS: 20.0000 mg | ORAL_TABLET | Freq: Every day | ORAL | Status: DC

## 2012-05-10 MED ORDER — LORATADINE 10 MG PO TABS: 10.0000 mg | ORAL_TABLET | Freq: Every day | ORAL | Status: DC

## 2012-05-10 MED ORDER — METOPROLOL TARTRATE 100 MG PO TABS: 150.0000 mg | ORAL_TABLET | Freq: Two times a day (BID) | ORAL | Status: DC

## 2012-05-10 NOTE — Telephone Encounter (Signed)
I just refilled this earlier this afternoon.

## 2012-05-10 NOTE — Telephone Encounter (Signed)
Flag sent to front desk pool for appt per Dr Butcher. 

## 2012-05-10 NOTE — Telephone Encounter (Signed)
Needs [facility] app with PCP. Will refill med. Please also assign a PCP. Thanls

## 2012-06-07 ENCOUNTER — Other Ambulatory Visit: Payer: Self-pay | Admitting: Internal Medicine

## 2012-07-03 ENCOUNTER — Ambulatory Visit (INDEPENDENT_AMBULATORY_CARE_PROVIDER_SITE_OTHER): Payer: Medicaid Other | Admitting: Internal Medicine

## 2012-07-03 ENCOUNTER — Encounter: Payer: Self-pay | Admitting: Internal Medicine

## 2012-07-03 VITALS — BP 132/82 | HR 63 | Temp 97.0°F | Ht 74.0 in | Wt 229.4 lb

## 2012-07-03 DIAGNOSIS — J302 Other seasonal allergic rhinitis: Secondary | ICD-10-CM

## 2012-07-03 DIAGNOSIS — E119 Type 2 diabetes mellitus without complications: Secondary | ICD-10-CM | POA: Insufficient documentation

## 2012-07-03 DIAGNOSIS — I10 Essential (primary) hypertension: Secondary | ICD-10-CM

## 2012-07-03 DIAGNOSIS — IMO0001 Reserved for inherently not codable concepts without codable children: Secondary | ICD-10-CM

## 2012-07-03 DIAGNOSIS — E785 Hyperlipidemia, unspecified: Secondary | ICD-10-CM

## 2012-07-03 DIAGNOSIS — Z0000 Encounter for general adult medical examination without abnormal findings: Secondary | ICD-10-CM

## 2012-07-03 DIAGNOSIS — Z23 Encounter for immunization: Secondary | ICD-10-CM

## 2012-07-03 DIAGNOSIS — F17200 Nicotine dependence, unspecified, uncomplicated: Secondary | ICD-10-CM

## 2012-07-03 DIAGNOSIS — Z720 Tobacco use: Secondary | ICD-10-CM

## 2012-07-03 DIAGNOSIS — M549 Dorsalgia, unspecified: Secondary | ICD-10-CM | POA: Insufficient documentation

## 2012-07-03 DIAGNOSIS — Z79899 Other long term (current) drug therapy: Secondary | ICD-10-CM

## 2012-07-03 DIAGNOSIS — R7303 Prediabetes: Secondary | ICD-10-CM

## 2012-07-03 LAB — POCT GLYCOSYLATED HEMOGLOBIN (HGB A1C): Hemoglobin A1C: 8.4

## 2012-07-03 MED ORDER — NICOTINE 14 MG/24HR TD PT24: 1.0000 | MEDICATED_PATCH | TRANSDERMAL | Status: DC

## 2012-07-03 MED ORDER — DIPHENHYDRAMINE HCL 25 MG PO CAPS: 25.0000 mg | ORAL_CAPSULE | Freq: Every evening | ORAL | Status: DC | PRN

## 2012-07-03 MED ORDER — MELOXICAM 7.5 MG PO TABS: 7.5000 mg | ORAL_TABLET | Freq: Two times a day (BID) | ORAL | Status: DC

## 2012-07-03 MED ORDER — METOPROLOL TARTRATE 100 MG PO TABS: ORAL_TABLET | ORAL | Status: DC

## 2012-07-03 MED ORDER — PRAVASTATIN SODIUM 20 MG PO TABS: 20.0000 mg | ORAL_TABLET | Freq: Every day | ORAL | Status: DC

## 2012-07-03 MED ORDER — METFORMIN HCL 1000 MG PO TABS: 1000.0000 mg | ORAL_TABLET | Freq: Two times a day (BID) | ORAL | Status: DC

## 2012-07-03 NOTE — Assessment & Plan Note (Signed)
x1 month, lower back radiating to both sides.  Claims started after helping one of his friends work.  Appears MSK vs. Strain related injury.  Mild relief with tramadol in the past.    -will try mobic 7.5mg  BID x1 month -if no improvement in pain, will need to try something stronger for pain and consider PT

## 2012-07-03 NOTE — Patient Instructions (Addendum)
Please take your medications daily  Call if your back pain does not improve  Please quit smoking and consider calling [email or web address]

## 2012-07-03 NOTE — Progress Notes (Signed)
Subjective:   Patient ID: Ronald Marsh male   DOB: Oct 01, 1960 52 y.o.   [name]: [id number]  HPI: [name] is a 52 y.o. African American male with PMH of HTN (controlled), CVA x2 with residual right sided weakness, Hyperlipidemia, tobacco abuse, and new diagnosis of DM2 in clinic today presented for routine follow up and medication refills.  He also complains of lower back pain x1 month, intermittent, radiating to lower right and left back at times and shooting pain down b/l legs.  Worse when he tries to sit up from lying or sitting position.  Claims may have started after helping a friend with work approximately one moth ago.  Tramadol has not helped much.  Would like something for pain.    Of note, noted to have pre-diabetes on last opc visit. Hb A1C today 8.4.    Past Medical History  Diagnosis Date  . Tobacco abuse   . Hypertension   . Hyperlipidemia   . History of CVA (cerebrovascular accident) 02/2010    Left periventricular subcortical ischemic infarction // Residual RUE and RLE weakness  . Internal carotid artery stenosis     BL and mild per CT angiogram (05/2010)  . History of depression     surrounding original stroke - has since resolved (04/2012)  . Prediabetes    Current Outpatient Prescriptions  Medication Sig Dispense Refill  . amLODipine (NORVASC) 10 MG tablet Take 1 tablet (10 mg total) by mouth daily.  30 tablet  5  . aspirin 81 MG tablet Take 81 mg by mouth daily.      . cloNIDine (CATAPRES) 0.2 MG tablet Take 1 tablet (0.2 mg total) by mouth 2 (two) times daily.  60 tablet  5  . loratadine (CLARITIN) 10 MG tablet TAKE ONE TABLET BY MOUTH ONE TIME DAILY  15 tablet  0  . metoprolol (LOPRESSOR) 100 MG tablet TAKE ONE AND ONE-HALF TABLETS BY MOUTH TWICE DAILY  60 tablet  5  . pravastatin (PRAVACHOL) 20 MG tablet Take 1 tablet (20 mg total) by mouth daily.  30 tablet  3  . diphenhydrAMINE (BENADRYL) 25 mg capsule Take 1 capsule (25 mg total) by mouth at bedtime as  needed for allergies or sleep.  24 capsule  1  . meloxicam (MOBIC) 7.5 MG tablet Take 1 tablet (7.5 mg total) by mouth 2 (two) times daily.  60 tablet  0  . metFORMIN (GLUCOPHAGE) 1000 MG tablet Take 1 tablet (1,000 mg total) by mouth 2 (two) times daily with a meal.  60 tablet  5  . nicotine (NICODERM CQ - DOSED IN MG/24 HOURS) 14 mg/24hr patch Place 1 patch onto the skin daily.  30 patch  0   No current facility-administered medications for this visit.   Family History  Problem Relation Age of Onset  . Hypertension Brother   . Diabetes Mother   . Heart failure Mother   . Hypertension Mother   . Hyperlipidemia Mother   . Lung cancer Father   . Diabetes Sister   . Arthritis Sister    History   Social History  . Marital Status: Single    Spouse Name: N/A    Number of Children: 1  . Years of Education: 11th grade   Occupational History  . On disability     previously worked for [employer] until his stroke in 2011.    Social History Main Topics  . Smoking status: Current Every Day Smoker -- 0.30 packs/day for 31 years  Types: Cigarettes    Start date: 04/25/1981  . Smokeless tobacco: None     Comment: 1 -2 cigs/day; cutting back.  . Alcohol Use: Yes     Comment: 1-2 beers approximately once a week  . Drug Use: No  . Sexually Active: None   Other Topics Concern  . None   Social History Narrative   Lives in [place] with wife.   Has been incarcerated multiple times, last time was in 2012 for 8 months.   Review of Systems: Constitutional: Denies fever, chills, diaphoresis, appetite change and fatigue.  HEENT: Denies photophobia, eye pain, redness, hearing loss, ear pain, congestion, sore throat, rhinorrhea, sneezing, mouth sores, trouble swallowing, neck pain, neck stiffness and tinnitus.   Respiratory: Denies SOB, DOE, cough, chest tightness,  and wheezing.   Cardiovascular: Denies chest pain, palpitations and leg swelling.  Gastrointestinal: Denies nausea,  vomiting, abdominal pain, diarrhea, constipation, blood in stool and abdominal distention.  Genitourinary: Denies dysuria, urgency, frequency, hematuria, flank pain and difficulty urinating.  Musculoskeletal: Back pain, right sided weakness since CVA, slightly staggered gait needing to use cane occasionally for support.  Denies myalgias, joint swelling, arthralgias.  Skin: Denies pallor, rash and wound.  Neurological: Denies dizziness, seizures, syncope, weakness, light-headedness, numbness and headaches.  Hematological: Denies adenopathy. Easy bruising, personal or family bleeding history  Psychiatric/Behavioral: Denies suicidal ideation, mood changes, confusion, nervousness, sleep disturbance and agitation  Objective:  Physical Exam: Filed Vitals:   07/03/12 1321  BP: 132/82  Pulse: 63  Temp: 97 F (36.1 C)  TempSrc: Oral  Height: 6\' 2"  (1.88 m)  Weight: 229 lb 6.4 oz (104.055 kg)  SpO2: 98%   Constitutional: Vital signs reviewed.  Patient is a well-developed and well-nourished male in no acute distress and cooperative with exam. Alert and oriented x3.  Head: Normocephalic and atraumatic Ear: TM normal bilaterally Mouth: no erythema or exudates, MMM Eyes: PERRL, EOMI, conjunctivae normal, No scleral icterus.  Neck: Supple, Trachea midline normal ROM, No JVD, mass, thyromegaly, or carotid bruit present.  Cardiovascular: RRR, S1 normal, S2 normal, no MRG, pulses symmetric and intact bilaterally Pulmonary/Chest: CTAB, no wheezes, rales, or rhonchi Abdominal: Soft. Non-tender, non-distended, bowel sounds are normal, no masses, organomegaly, or guarding present.  GU: no CVA tenderness Musculoskeletal: No joint deformities, erythema, or stiffness. +back lower back pain, tender to deep palpation, good lateral flexion but limited ability to touch toes without discomfort.  Hematology: no cervical, inginal, or axillary adenopathy.  Neurological: A&O x3, Strength is 3/5 right upper and lower  extremities compared to left. cranial nerve II-XII are grossly intact, sensory intact to light touch bilaterally.  Skin: Warm, dry and intact. No rash, cyanosis, or clubbing.  Psychiatric: Normal mood and affect. speech and behavior is normal. Judgment and thought content normal. Cognition and memory are normal.   Assessment & Plan:   Discussed with Dr. Criselda Peaches: Back pain--will try one month of Mobic BID HTN--well controlled on current regimen Smoking cessation--prescribed nicotine patches New diagnosis of diabetes--will start metformin 1000mg  po bid and will need to see donna Given tdap and pneumococcal vaccine today

## 2012-07-03 NOTE — Assessment & Plan Note (Signed)
Still smoking up to 4 cigs a day which he says is an improvement.  Is willing to try nicotine patches along side his wife to help quit.  Referred to [email or web address] as well in case insurance does not cover patches.    -extensive counseling for cessation given to both wife and Ronald Marsh  Assessment:  Progress toward smoking cessation:  smoking less  Barriers to progress toward smoking cessation:  lack of motivation to quit  Plan:  Instruction/counseling given:  I counseled patient on the dangers of tobacco use, advised patient to stop smoking and reviewed strategies to maximize success.  Educational resources provided:  QuitlineNC [employer], [employer]) brochure  Self management tools provided:  smoking cessation plan (STAR Quit Plan)  Medications to assist with smoking cessation:  Nicotine Patch Patient agreed to the following self-care plans for smoking cessation:

## 2012-07-03 NOTE — Assessment & Plan Note (Addendum)
Given tdap and pneumococcal vaccine today.

## 2012-07-03 NOTE — Assessment & Plan Note (Signed)
BP at goal today.  On norvasc 10mg  qd, clonidine 0.2mg  bid, and lopressor 150 mg BID  BP Readings from Last 3 Encounters:  07/03/12 132/82  04/25/12 139/89    Lab Results  Component Value Date   NA 138 04/25/2012   K 4.3 04/25/2012   CREATININE 0.98 04/25/2012   Assessment:  Blood pressure control: controlled  Progress toward BP goal:  at goal  Comments: continue current medications  Plan:  Medications:  continue current medications On norvasc 10mg  qd, clonidine 0.2mg  bid, and lopressor 150 mg BID  Educational resources provided: brochure  Self management tools provided:

## 2012-07-03 NOTE — Assessment & Plan Note (Signed)
On pravastatin 20mg  at home daily.  TG 363 and LDL 31 and HDL 26.    -will continue statin for now -counseled on diet modifications and exercise -may need fibrate in future if TG keep increasing.

## 2012-07-03 NOTE — Assessment & Plan Note (Signed)
A1C resulted to be 8.4 at end of visit from lab.  Will need to touch base with patient, educate on diabetes and start Metformin.  -consult diabetes educator Lupita Leash -start Metformin 1000mg  PO BID  Lab Results  Component Value Date   HGBA1C 8.4 07/03/2012   HGBA1C  Value: 6.0 (NOTE)                                                                       According to the ADA Clinical Practice Recommendations for 2011, when HbA1c is used as a screening test:   >=6.5%   Diagnostic of Diabetes Mellitus           (if abnormal result  is confirmed)  5.7-6.4%   Increased risk of developing Diabetes Mellitus  References:Diagnosis and Classification of Diabetes Mellitus,Diabetes Care,2011,34(Suppl 1):S62-S69 and Standards of Medical Care in         Diabetes - 2011,Diabetes Care,2011,34  (Suppl 1):S11-S61.* 06/01/2010   HGBA1C  Value: 6.1 (NOTE)                                                                       According to the ADA Clinical Practice Recommendations for 2011, when HbA1c is used as a screening test:   >=6.5%   Diagnostic of Diabetes Mellitus           (if abnormal result  is confirmed)  5.7-6.4%   Increased risk of developing Diabetes Mellitus  References:Diagnosis and Classification of Diabetes Mellitus,Diabetes Care,2011,34(Suppl 1):S62-S69 and Standards of Medical Care in         Diabetes - 2011,Diabetes ZOXW,9604,54  (Suppl 1):S11-S61.* 02/26/2010     Assessment:  Diabetes control:  un controlled  Progress toward A1C goal:   new diagnosis  Comments: will start oral therapy and will need education  Plan:  Medications:  start metformin 1000mg  po bid  Home glucose monitoring:   Frequency:     Timing:    Instruction/counseling given: will need diabetes education session

## 2012-07-09 ENCOUNTER — Ambulatory Visit: Payer: Medicaid Other | Admitting: Dietician

## 2012-07-14 ENCOUNTER — Other Ambulatory Visit: Payer: Self-pay | Admitting: Internal Medicine

## 2012-07-16 NOTE — Telephone Encounter (Signed)
He was on a trial of mobic.  Did it not work? Who is requesting refill? Pharmacy or patient?

## 2012-10-10 ENCOUNTER — Telehealth: Payer: Self-pay | Admitting: *Deleted

## 2012-10-10 NOTE — Telephone Encounter (Signed)
Fax from [facility]  Requesting refill on Tramadol 50mg  - Take 1 tab q6hr PRN pain.  Called pt - stated Mobic did not work for him; tried it for about 1-1.5 month; prefers Tramadol. Thanks

## 2012-10-11 NOTE — Telephone Encounter (Signed)
Ronald Marsh, was this addressed yesterday? I see this patient was seen for this in February, for back pain. I suggest he be re-evaluated before more pain medication given. Thanks.

## 2012-10-11 NOTE — Telephone Encounter (Signed)
Pt and pharmacy made awared - he needs to be evaluated. Talked to pt's wife, he needs to call and make an appt. She understood.

## 2012-11-02 ENCOUNTER — Other Ambulatory Visit: Payer: Self-pay | Admitting: Internal Medicine

## 2012-11-06 ENCOUNTER — Other Ambulatory Visit: Payer: Self-pay | Admitting: Internal Medicine

## 2012-11-07 ENCOUNTER — Encounter: Payer: Self-pay | Admitting: Internal Medicine

## 2012-11-15 ENCOUNTER — Other Ambulatory Visit: Payer: Self-pay

## 2012-12-04 ENCOUNTER — Encounter: Payer: Medicaid Other | Admitting: Internal Medicine

## 2012-12-06 ENCOUNTER — Other Ambulatory Visit: Payer: Self-pay | Admitting: Internal Medicine

## 2012-12-25 ENCOUNTER — Encounter: Payer: Medicaid Other | Admitting: Internal Medicine

## 2012-12-27 ENCOUNTER — Other Ambulatory Visit: Payer: Self-pay | Admitting: Internal Medicine

## 2013-01-22 ENCOUNTER — Ambulatory Visit (INDEPENDENT_AMBULATORY_CARE_PROVIDER_SITE_OTHER): Payer: Medicaid Other | Admitting: Internal Medicine

## 2013-01-22 ENCOUNTER — Encounter: Payer: Self-pay | Admitting: Internal Medicine

## 2013-01-22 VITALS — BP 133/87 | HR 62 | Temp 96.8°F | Ht 74.0 in | Wt 197.5 lb

## 2013-01-22 DIAGNOSIS — F17200 Nicotine dependence, unspecified, uncomplicated: Secondary | ICD-10-CM

## 2013-01-22 DIAGNOSIS — M549 Dorsalgia, unspecified: Secondary | ICD-10-CM

## 2013-01-22 DIAGNOSIS — R634 Abnormal weight loss: Secondary | ICD-10-CM

## 2013-01-22 DIAGNOSIS — Z720 Tobacco use: Secondary | ICD-10-CM

## 2013-01-22 DIAGNOSIS — IMO0001 Reserved for inherently not codable concepts without codable children: Secondary | ICD-10-CM

## 2013-01-22 DIAGNOSIS — R609 Edema, unspecified: Secondary | ICD-10-CM

## 2013-01-22 DIAGNOSIS — I10 Essential (primary) hypertension: Secondary | ICD-10-CM

## 2013-01-22 DIAGNOSIS — B351 Tinea unguium: Secondary | ICD-10-CM

## 2013-01-22 DIAGNOSIS — Z23 Encounter for immunization: Secondary | ICD-10-CM

## 2013-01-22 DIAGNOSIS — Z0000 Encounter for general adult medical examination without abnormal findings: Secondary | ICD-10-CM

## 2013-01-22 LAB — POCT GLYCOSYLATED HEMOGLOBIN (HGB A1C): Hemoglobin A1C: 5.5

## 2013-01-22 LAB — GLUCOSE, CAPILLARY: Glucose-Capillary: 111 mg/dL — ABNORMAL HIGH (ref 70–99)

## 2013-01-22 MED ORDER — PRAVASTATIN SODIUM 20 MG PO TABS: ORAL_TABLET | ORAL | Status: DC

## 2013-01-22 MED ORDER — CLONIDINE HCL 0.2 MG PO TABS: ORAL_TABLET | ORAL | Status: DC

## 2013-01-22 MED ORDER — METFORMIN HCL 1000 MG PO TABS: 500.0000 mg | ORAL_TABLET | Freq: Two times a day (BID) | ORAL | Status: DC

## 2013-01-22 MED ORDER — METFORMIN HCL 1000 MG PO TABS: 1000.0000 mg | ORAL_TABLET | Freq: Every day | ORAL | Status: DC

## 2013-01-22 MED ORDER — AMLODIPINE BESYLATE 10 MG PO TABS: ORAL_TABLET | ORAL | Status: DC

## 2013-01-22 MED ORDER — METOPROLOL TARTRATE 100 MG PO TABS: ORAL_TABLET | ORAL | Status: DC

## 2013-01-22 MED ORDER — NICOTINE 21 MG/24HR TD PT24: 1.0000 | MEDICATED_PATCH | TRANSDERMAL | Status: DC

## 2013-01-22 NOTE — Assessment & Plan Note (Signed)
Continues to have pain. Reports little success with OTC medications and denies relif with mobic. Claims tramadol has helped in the past. Also reports occasional facial and tongue swelling, unclear etiology of inciting agent. Will wait for allergen testing. Appears to have good strength in b/l lower extremities with residual weakness in RUE.   Encouraged to try mobic again as no reported allergy to medication -may benefit from PT -consider restart tramadol?

## 2013-01-22 NOTE — Progress Notes (Signed)
Subjective:   Patient ID: Ronald Marsh male   DOB: 18-Mar-1961 52 y.o.   [name]: [id number]  HPI: RonaldRonald Marsh is a 52 y.o. 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 His wife is also present. Both husband and wife continue to smoke but claim they have cut down with the patch but may need higher dose of patch.  He reports smoking ~3 cigs/day but is trying to quit. We discussed cessation in detail again today, i recommended quit date and trying different NRT if needed but they wish to keep with patch for now but will increase to 21mg .   Ronald Marsh is noted to have decreased appetite since starting metformin and claims to sometimes only eat once or twice a day. He has lost 32lbs since February, he claims since being on the metformin and controlling his diet. His A1C has improved to 5.5 today from 8.4 in February.  We will decrease metformin today and continue to monitor to see if his appetite improves with lowered dose. He denies any fever, chills, night sweats, diarrhea, nausea, vomiting, abdominal pain, blood in stool or sputum, cough. He says he occasionally sweats a little at night and has had dizziness occasionally in the past after his stroke. He does report right sided body pain and says tramadol has worked in the past but otc nsaids have not. They will try mobic for now until allergy testing is done to make sure no allergies with current and future medications.   Finally, Ronald Marsh and his wife reports episodes of Ronald Marsh having tongue and facial swelling ~ once a month randomly.  They says sometimes he will wake up in the morning and they will notice his tongue and once side of his face to be swollen. He denies itching and does not know inciting factors. He has a hx of swelling from ace inhibitors but is not currently on any of  those medications. The swelling occurred in the past before metformin was initiated as well.  They would like to get allergen testing and deny any known food allergies. When his tongue is swollen, he denies trouble breathing or swallowing and says symptoms improved over a day or two and with benadryl. I have advised them to record next occurrence if it happens and to also notice what he eats and which medications he takes and if reaction is severe along with tongue swelling or difficulty breathing or swallowing that they need to go to the ED immediately.   In regards to his BP, he is adherent to his medications and requests 90 day supplies of his medications.   Past Medical History  Diagnosis Date  . Tobacco abuse   . Hypertension   . Hyperlipidemia   . History of CVA (cerebrovascular accident) 02/2010    Left periventricular subcortical ischemic infarction // Residual RUE and RLE weakness  . Internal carotid artery stenosis     BL and mild per CT angiogram (05/2010)  . History of depression     surrounding original stroke - has since resolved (04/2012)  . Prediabetes    Current Outpatient Prescriptions  Medication Sig Dispense Refill  . amLODipine (NORVASC) 10 MG tablet TAKE 1 TABLET BY MOUTH DAILY  30 tablet  5  . aspirin 81 MG tablet Take 81 mg by mouth daily.      . cloNIDine (CATAPRES)  0.2 MG tablet TAKE 1 TABLET BY MOUTH TWICE DAILY  60 tablet  3  . diphenhydrAMINE (BENADRYL) 25 mg capsule Take 1 capsule (25 mg total) by mouth at bedtime as needed for allergies or sleep.  24 capsule  1  . loratadine (CLARITIN) 10 MG tablet TAKE ONE TABLET BY MOUTH ONE TIME DAILY  15 tablet  0  . meloxicam (MOBIC) 7.5 MG tablet Take 1 tablet (7.5 mg total) by mouth 2 (two) times daily.  60 tablet  0  . metFORMIN (GLUCOPHAGE) 1000 MG tablet Take 1 tablet (1,000 mg total) by mouth 2 (two) times daily with a meal.  60 tablet  5  . metoprolol (LOPRESSOR) 100 MG tablet TAKE 1 AND 1/2 TABLETS BY MOUTH TWICE  DAILY  90 tablet  5  . nicotine (NICODERM CQ - DOSED IN MG/24 HOURS) 14 mg/24hr patch Place 1 patch onto the skin daily.  30 patch  0  . pravastatin (PRAVACHOL) 20 MG tablet TAKE 1 TABLET BY MOUTH DAILY  30 tablet  3   No current facility-administered medications for this visit.   Family History  Problem Relation Age of Onset  . Hypertension Brother   . Diabetes Mother   . Heart failure Mother   . Hypertension Mother   . Hyperlipidemia Mother   . Lung cancer Father   . Diabetes Sister   . Arthritis Sister    History   Social History  . Marital Status: Single    Spouse Name: N/A    Number of Children: 1  . Years of Education: 11th grade   Occupational History  . On disability     previously worked for [employer] until his stroke in 2011.    Social History Main Topics  . Smoking status: Current Every Day Smoker -- 0.30 packs/day for 31 years    Types: Cigarettes    Start date: 04/25/1981  . Smokeless tobacco: None     Comment: up to 4 cigs/day; cutting back.  . Alcohol Use: No  . Drug Use: No  . Sexual Activity: None   Other Topics Concern  . None   Social History Narrative   Lives in [place] with wife.   Has been incarcerated multiple times, last time was in 2012 for 8 months.   Review of Systems:  Constitutional:  Decreased appetite and weight loss.    HEENT:  Denies congestion, sore throat, rhinorrhea, sneezing, mouth sores, trouble swallowing  Respiratory:  Denies SOB  Cardiovascular:  Denies chest pain, palpitations, and leg swelling.   Gastrointestinal:  Denies nausea, vomiting, abdominal pain  Genitourinary:  Denies dysuria, urgency  Musculoskeletal:  R arm residual weakness s/p cva, walks with cane. Back pain  Skin:  Denies pallor, rash and wound.   Neurological:  Denies dizziness, syncope, light-headedness   Objective:  Physical Exam: Filed Vitals:   01/22/13 1350  BP: 133/87  Pulse: 62  Temp: 96.8 F (36 C)  TempSrc: Oral  Height: 6\' 2"   (1.88 m)  Weight: 197 lb 8 oz (89.585 kg)  SpO2: 100%   Vitals reviewed. General: sitting in chair, NAD HEENT: PERRL, EOMI, no scleral icterus Cardiac: RRR, no rubs, murmurs or gallops Pulm: clear to auscultation bilaterally, no wheezes, rales, or rhonchi Abd: soft, nontender, nondistended, BS present Ext: warm and well perfused, no pedal edema, +1DP R foot vs +2DP left foot, no tenderness to palpation. Broken and darkened toe nails b/l feet Neuro: alert and oriented X3, cranial nerves II-XII grossly intact, strength  and sensation to light touch equal in bilateral lower extremities, strength 3-4/5 RUE vs 5/5 RLE  Assessment & Plan:  Discussed with Dr. Kem Kays Swelling: c4, allergy referral Weight loss and decreased appetite: decrease metformin to 500mg  bid Smoking cessation: increased nicotine patch to 21mg  Flu vaccine given today Back pain: will continue with mobic for now until allergy testing Podiatry referral: broken toe nails and discoloration

## 2013-01-22 NOTE — Assessment & Plan Note (Addendum)
32lb weight loss since February 2014, after starting metformin, reports decreased appetite. colonoscpy next year. No hx of anemia. Denies blood in stool. Does smoke cigarettes. Denies fever, chills, nightsweats, cough, blood in stool or sputum.   -decrease metformin to 500mg  bid -monitor weight and see if increase in appetite with lower dose of medicine

## 2013-01-22 NOTE — Assessment & Plan Note (Signed)
Lab Results  Component Value Date   HGBA1C 5.5 01/22/2013   HGBA1C 8.4 07/03/2012   HGBA1C  Value: 6.0 (NOTE)                                                                       According to the ADA Clinical Practice Recommendations for 2011, when HbA1c is used as a screening test:   >=6.5%   Diagnostic of Diabetes Mellitus           (if abnormal result  is confirmed)  5.7-6.4%   Increased risk of developing Diabetes Mellitus  References:Diagnosis and Classification of Diabetes Mellitus,Diabetes Care,2011,34(Suppl 1):S62-S69 and Standards of Medical Care in         Diabetes - 2011,Diabetes Care,2011,34  (Suppl 1):S11-S61.* 06/01/2010   Assessment: Diabetes control: good control (HgbA1C at goal) Progress toward A1C goal:  at goal Comments: much improved with metformin, but has 32lb weight loss and decreased appetite with metformin  Plan: Medications:  decrease metformin to 500mg  bid Home glucose monitoring: Frequency: 3 times a day Timing: before meals Instruction/counseling given: reminded to get eye exam, reminded to bring blood glucose meter & log to each visit, reminded to bring medications to each visit and discussed foot care

## 2013-01-22 NOTE — Assessment & Plan Note (Signed)
BP Readings from Last 3 Encounters:  01/22/13 133/87  07/03/12 132/82  04/25/12 139/89   Lab Results  Component Value Date   NA 138 04/25/2012   K 4.3 04/25/2012   CREATININE 0.98 04/25/2012    Assessment: Blood pressure control: controlled Progress toward BP goal:  at goal Comments: refilled medications for 90 day supply  Plan: Medications:  continue current medications norvasc 10mg , clonidine, metoprolol

## 2013-01-22 NOTE — Patient Instructions (Addendum)
Please STOP smoking, set a quit date as we discussed with your family and seriously stop smoking. Continue to use the patches.   Please follow up with your eye doctor for eye exam and podiatry for your feet  Please follow up with allergy. If your swelling returns and if you have trouble swallowing, hives, or shortness of breath, call the clinic immediately, or go directly to ED  We have decreased your metformin dose to 500mg  twice a day  Treatment Goals:  Goals (1 Years of Data) as of 01/22/13         As of Today 07/03/12 04/25/12 06/01/10     Blood Pressure    . Blood Pressure < 140/90  133/87 132/82 139/89      Lifestyle    . Quit smoking / using tobacco   No       Result Component    . HEMOGLOBIN A1C < 7.0  5.5 8.4  6.0.Marland KitchenMarland Kitchen      Progress Toward Treatment Goals:  Treatment Goal 01/22/2013  Hemoglobin A1C at goal  Blood pressure at goal  Stop smoking smoking less    Self Care Goals & Plans:  Self Care Goal 01/22/2013  Manage my medications take my medicines as prescribed  Monitor my health -  Eat healthy foods -  Be physically active -  Stop smoking call [facility] (1-800-QUIT-NOW); set a quit date and stop smoking; cut down the number of cigarettes smoked    Home Blood Glucose Monitoring 01/22/2013  Check my blood sugar 3 times a day  When to check my blood sugar before meals     Care Management & Community Referrals:  Referral 07/03/2012  Referrals made for care management support diabetes educator  Referrals made to community resources nutrition

## 2013-01-22 NOTE — Assessment & Plan Note (Signed)
Flu vaccine given today. 

## 2013-01-22 NOTE — Assessment & Plan Note (Signed)
  Assessment: Progress toward smoking cessation:  smoking less Barriers to progress toward smoking cessation:    Comments: down to 3 cigs/day, wife smokes as well. Spent several minutes again today on cessation counseling, encouraged to set quit date, will increase dose of patches.   Plan: Instruction/counseling given:  I counseled patient on the dangers of tobacco use, advised patient to stop smoking, and reviewed strategies to maximize success. Educational resources provided:  [facility] (1-800-QUIT-NOW) brochure;smoking cessation Manufacturing systems engineer tools provided:    Medications to assist with smoking cessation:  Nicotine Patch 21mg  Patient agreed to the following self-care plans for smoking cessation: call [facility] (1-800-QUIT-NOW);set a quit date and stop smoking;cut down the number of cigarettes smoked

## 2013-01-23 LAB — C4 COMPLEMENT: C4 Complement: 31 mg/dL (ref 10–40)

## 2013-01-23 LAB — MICROALBUMIN / CREATININE URINE RATIO
Creatinine, Urine: 641.3 mg/dL
Microalb, Ur: 2.86 mg/dL — ABNORMAL HIGH (ref 0.00–1.89)

## 2013-01-23 NOTE — Progress Notes (Signed)
INTERNAL MEDICINE TEACHING ATTENDING ADDENDUM - Jonah Blue, DO, FACP: I reviewed with the resident Girard Cooter, MD, [name]' medical history, physical examination, diagnosis and results of tests and treatment and I agree with the patient's care as documented.

## 2013-02-24 ENCOUNTER — Other Ambulatory Visit: Payer: Self-pay | Admitting: Internal Medicine

## 2013-02-25 NOTE — Telephone Encounter (Signed)
He already has 180 tablets per month with 3 refills filled for this in September

## 2013-03-01 ENCOUNTER — Emergency Department (HOSPITAL_COMMUNITY): Payer: Medicaid Other

## 2013-03-01 ENCOUNTER — Emergency Department (INDEPENDENT_AMBULATORY_CARE_PROVIDER_SITE_OTHER)
Admission: EM | Admit: 2013-03-01 | Discharge: 2013-03-01 | Disposition: A | Discharge status: Nursing Home (Includes ICF) | Payer: Medicaid Other | Source: Home / Self Care | Attending: Family Medicine | Admitting: Family Medicine

## 2013-03-01 ENCOUNTER — Encounter (HOSPITAL_COMMUNITY): Payer: Self-pay | Admitting: Emergency Medicine

## 2013-03-01 ENCOUNTER — Observation Stay (HOSPITAL_COMMUNITY)
Admission: EM | Admit: 2013-03-01 | Discharge: 2013-03-02 | Disposition: A | Discharge status: Home / Self Care | Payer: Medicaid Other | Attending: Infectious Diseases | Admitting: Infectious Diseases

## 2013-03-01 ENCOUNTER — Emergency Department (INDEPENDENT_AMBULATORY_CARE_PROVIDER_SITE_OTHER): Payer: Medicaid Other

## 2013-03-01 DIAGNOSIS — I10 Essential (primary) hypertension: Secondary | ICD-10-CM

## 2013-03-01 DIAGNOSIS — R079 Chest pain, unspecified: Secondary | ICD-10-CM

## 2013-03-01 DIAGNOSIS — M549 Dorsalgia, unspecified: Secondary | ICD-10-CM | POA: Insufficient documentation

## 2013-03-01 DIAGNOSIS — Z720 Tobacco use: Secondary | ICD-10-CM | POA: Diagnosis present

## 2013-03-01 DIAGNOSIS — R0789 Other chest pain: Principal | ICD-10-CM | POA: Insufficient documentation

## 2013-03-01 DIAGNOSIS — Z888 Allergy status to other drugs, medicaments and biological substances status: Secondary | ICD-10-CM | POA: Insufficient documentation

## 2013-03-01 DIAGNOSIS — R071 Chest pain on breathing: Secondary | ICD-10-CM

## 2013-03-01 DIAGNOSIS — Z8673 Personal history of transient ischemic attack (TIA), and cerebral infarction without residual deficits: Secondary | ICD-10-CM | POA: Insufficient documentation

## 2013-03-01 DIAGNOSIS — Z79899 Other long term (current) drug therapy: Secondary | ICD-10-CM | POA: Insufficient documentation

## 2013-03-01 DIAGNOSIS — E119 Type 2 diabetes mellitus without complications: Secondary | ICD-10-CM | POA: Insufficient documentation

## 2013-03-01 DIAGNOSIS — K219 Gastro-esophageal reflux disease without esophagitis: Secondary | ICD-10-CM

## 2013-03-01 DIAGNOSIS — Z87891 Personal history of nicotine dependence: Secondary | ICD-10-CM | POA: Insufficient documentation

## 2013-03-01 DIAGNOSIS — Z7982 Long term (current) use of aspirin: Secondary | ICD-10-CM | POA: Insufficient documentation

## 2013-03-01 DIAGNOSIS — R0602 Shortness of breath: Secondary | ICD-10-CM | POA: Insufficient documentation

## 2013-03-01 DIAGNOSIS — I6529 Occlusion and stenosis of unspecified carotid artery: Secondary | ICD-10-CM | POA: Insufficient documentation

## 2013-03-01 DIAGNOSIS — R0781 Pleurodynia: Secondary | ICD-10-CM

## 2013-03-01 DIAGNOSIS — Z8659 Personal history of other mental and behavioral disorders: Secondary | ICD-10-CM | POA: Insufficient documentation

## 2013-03-01 DIAGNOSIS — E785 Hyperlipidemia, unspecified: Secondary | ICD-10-CM | POA: Insufficient documentation

## 2013-03-01 HISTORY — DX: Type 2 diabetes mellitus without complications: E11.9

## 2013-03-01 LAB — CBC WITH DIFFERENTIAL/PLATELET
Eosinophils Absolute: 0.2 10*3/uL (ref 0.0–0.7)
Eosinophils Relative: 2 % (ref 0–5)
HCT: 40.5 % (ref 39.0–52.0)
Hemoglobin: 14.1 g/dL (ref 13.0–17.0)
Lymphs Abs: 1.7 10*3/uL (ref 0.7–4.0)
MCH: 32 pg (ref 26.0–34.0)
MCV: 91.8 fL (ref 78.0–100.0)
Monocytes Absolute: 0.9 10*3/uL (ref 0.1–1.0)
Monocytes Relative: 11 % (ref 3–12)
Platelets: 207 10*3/uL (ref 150–400)
RBC: 4.41 MIL/uL (ref 4.22–5.81)

## 2013-03-01 LAB — POCT I-STAT, CHEM 8
BUN: 10 mg/dL (ref 6–23)
Calcium, Ion: 1.24 mmol/L — ABNORMAL HIGH (ref 1.12–1.23)
Chloride: 103 mEq/L (ref 96–112)
Creatinine, Ser: 1 mg/dL (ref 0.50–1.35)
HCT: 44 % (ref 39.0–52.0)
Sodium: 141 mEq/L (ref 135–145)
TCO2: 27 mmol/L (ref 0–100)

## 2013-03-01 LAB — CBC
HCT: 37.5 % — ABNORMAL LOW (ref 39.0–52.0)
Hemoglobin: 13.2 g/dL (ref 13.0–17.0)
MCH: 32.1 pg (ref 26.0–34.0)
MCHC: 35.2 g/dL (ref 30.0–36.0)
MCV: 91.2 fL (ref 78.0–100.0)

## 2013-03-01 LAB — GLUCOSE, CAPILLARY
Glucose-Capillary: 94 mg/dL (ref 70–99)
Glucose-Capillary: 139 mg/dL — ABNORMAL HIGH (ref 70–99)

## 2013-03-01 LAB — TROPONIN I
Troponin I: <0.3 ng/mL (ref <0.30)
Troponin I: <0.3 ng/mL (ref <0.30)

## 2013-03-01 LAB — POCT I-STAT TROPONIN I: Troponin i, poc: 0 ng/mL (ref 0.00–0.08)

## 2013-03-01 LAB — CREATININE, SERUM: GFR calc Af Amer: >90 mL/min (ref >90)

## 2013-03-01 MED ORDER — ACETAMINOPHEN 325 MG PO TABS: 650.0000 mg | ORAL_TABLET | ORAL | Status: DC | PRN

## 2013-03-01 MED ORDER — BENZONATATE 100 MG PO CAPS: 200.0000 mg | ORAL_CAPSULE | Freq: Two times a day (BID) | ORAL | Status: DC

## 2013-03-01 MED ORDER — GI COCKTAIL ~~LOC~~
30.0000 mL | Freq: Once | ORAL | Status: AC
  Administered 2013-03-01: 30 mL via ORAL
  Filled 2013-03-01: qty 30

## 2013-03-01 MED ORDER — ASPIRIN EC 81 MG PO TBEC: 81.0000 mg | DELAYED_RELEASE_TABLET | Freq: Every day | ORAL | Status: DC

## 2013-03-01 MED ORDER — ASPIRIN 81 MG PO TABS: 81.0000 mg | ORAL_TABLET | Freq: Every day | ORAL | Status: DC

## 2013-03-01 MED ORDER — ASPIRIN EC 325 MG PO TBEC
325.0000 mg | DELAYED_RELEASE_TABLET | Freq: Every day | ORAL | Status: DC
  Administered 2013-03-02: 325 mg via ORAL
  Filled 2013-03-01: qty 1

## 2013-03-01 MED ORDER — GI COCKTAIL ~~LOC~~
30.0000 mL | Freq: Two times a day (BID) | ORAL | Status: DC | PRN
Start: 2013-03-01 — End: 2013-03-01

## 2013-03-01 MED ORDER — SIMVASTATIN 40 MG PO TABS: 40.0000 mg | ORAL_TABLET | Freq: Every day | ORAL | Status: DC

## 2013-03-01 MED ORDER — PANTOPRAZOLE SODIUM 40 MG PO TBEC
40.0000 mg | DELAYED_RELEASE_TABLET | Freq: Every day | ORAL | Status: DC
  Administered 2013-03-01: 40 mg via ORAL
  Filled 2013-03-01: qty 1

## 2013-03-01 MED ORDER — IOHEXOL 350 MG/ML SOLN
75.0000 mL | Freq: Once | INTRAVENOUS | Status: AC | PRN
  Administered 2013-03-01: 75 mL via INTRAVENOUS

## 2013-03-01 MED ORDER — IBUPROFEN 800 MG PO TABS: 800.0000 mg | ORAL_TABLET | Freq: Once | ORAL | Status: DC

## 2013-03-01 MED ORDER — CLONIDINE HCL 0.2 MG PO TABS
0.2000 mg | ORAL_TABLET | Freq: Two times a day (BID) | ORAL | Status: DC
  Administered 2013-03-01 – 2013-03-02 (×2): 0.2 mg via ORAL
  Filled 2013-03-01 (×3): qty 1

## 2013-03-01 MED ORDER — BENZONATATE 100 MG PO CAPS
200.0000 mg | ORAL_CAPSULE | Freq: Two times a day (BID) | ORAL | Status: DC
  Administered 2013-03-01 – 2013-03-02 (×2): 200 mg via ORAL
  Filled 2013-03-01 (×3): qty 2

## 2013-03-01 MED ORDER — NITROGLYCERIN 0.4 MG SL SUBL
0.4000 mg | SUBLINGUAL_TABLET | SUBLINGUAL | Status: DC | PRN
  Administered 2013-03-01 (×2): 0.4 mg via SUBLINGUAL
  Filled 2013-03-01: qty 25

## 2013-03-01 MED ORDER — ENOXAPARIN SODIUM 40 MG/0.4ML ~~LOC~~ SOLN
40.0000 mg | SUBCUTANEOUS | Status: DC
  Administered 2013-03-01: 40 mg via SUBCUTANEOUS
  Filled 2013-03-01 (×2): qty 0.4

## 2013-03-01 MED ORDER — ONDANSETRON HCL 4 MG/2ML IJ SOLN: 4.0000 mg | Freq: Four times a day (QID) | INTRAMUSCULAR | Status: DC | PRN

## 2013-03-01 MED ORDER — IBUPROFEN 800 MG PO TABS
800.0000 mg | ORAL_TABLET | Freq: Three times a day (TID) | ORAL | Status: DC
  Administered 2013-03-02 (×2): 800 mg via ORAL
  Filled 2013-03-01 (×3): qty 1

## 2013-03-01 MED ORDER — ASPIRIN 81 MG PO CHEW
324.0000 mg | CHEWABLE_TABLET | Freq: Once | ORAL | Status: AC
  Administered 2013-03-01: 324 mg via ORAL
  Filled 2013-03-01: qty 4

## 2013-03-01 MED ORDER — IBUPROFEN 800 MG PO TABS
800.0000 mg | ORAL_TABLET | Freq: Four times a day (QID) | ORAL | Status: DC | PRN
  Filled 2013-03-01: qty 1

## 2013-03-01 MED ORDER — PRAVASTATIN SODIUM 20 MG PO TABS
20.0000 mg | ORAL_TABLET | Freq: Every day | ORAL | Status: DC
  Filled 2013-03-01: qty 1

## 2013-03-01 MED ORDER — HYDROCOD POLST-CHLORPHEN POLST 10-8 MG/5ML PO LQCR
5.0000 mL | Freq: Two times a day (BID) | ORAL | Status: DC
  Administered 2013-03-01 – 2013-03-02 (×2): 5 mL via ORAL
  Filled 2013-03-01 (×2): qty 5

## 2013-03-01 MED ORDER — INSULIN ASPART 100 UNIT/ML ~~LOC~~ SOLN
0.0000 [IU] | Freq: Three times a day (TID) | SUBCUTANEOUS | Status: DC
  Administered 2013-03-02: 2 [IU] via SUBCUTANEOUS

## 2013-03-01 MED ORDER — AMLODIPINE BESYLATE 10 MG PO TABS
10.0000 mg | ORAL_TABLET | Freq: Every day | ORAL | Status: DC
  Administered 2013-03-02: 10 mg via ORAL
  Filled 2013-03-01: qty 1

## 2013-03-01 MED ORDER — GI COCKTAIL ~~LOC~~: 30.0000 mL | Freq: Four times a day (QID) | ORAL | Status: DC | PRN

## 2013-03-01 MED ORDER — METOPROLOL TARTRATE 50 MG PO TABS
150.0000 mg | ORAL_TABLET | Freq: Two times a day (BID) | ORAL | Status: DC
  Administered 2013-03-01 – 2013-03-02 (×2): 150 mg via ORAL
  Filled 2013-03-01 (×3): qty 1

## 2013-03-01 NOTE — ED Provider Notes (Signed)
Ronald Marsh is a 52 y.o. male who presents to Urgent Care today for chest pain starting 2 days ago. Patient essential sharp chest pain worse with deep inspiration and coughing. The pain radiates to the central thoracic back. He denies any pain with exertion however he does note some shortness of breath with exertion. He denies any palpitations fevers or chills. His cough has been bothersome for several days. No nausea vomiting or diarrhea.   Past Medical History  Diagnosis Date  . Tobacco abuse   . Hypertension   . Hyperlipidemia   . History of CVA (cerebrovascular accident) 02/2010    Left periventricular subcortical ischemic infarction // Residual RUE and RLE weakness  . Internal carotid artery stenosis     BL and mild per CT angiogram (05/2010)  . History of depression     surrounding original stroke - has since resolved (04/2012)  . Prediabetes    History  Substance Use Topics  . Smoking status: Current Every Day Smoker -- 0.30 packs/day for 31 years    Types: Cigarettes    Start date: 04/25/1981  . Smokeless tobacco: Not on file     Comment: up to 3 cigs/day; cutting back.  . Alcohol Use: No   ROS as above Medications reviewed. No current facility-administered medications for this encounter.   Current Outpatient Prescriptions  Medication Sig Dispense Refill  . amLODipine (NORVASC) 10 MG tablet TAKE 1 TABLET BY MOUTH DAILY  90 tablet  5  . aspirin 81 MG tablet Take 81 mg by mouth daily.      . cloNIDine (CATAPRES) 0.2 MG tablet TAKE 1 TABLET BY MOUTH TWICE DAILY  180 tablet  3  . metFORMIN (GLUCOPHAGE) 1000 MG tablet Take 0.5 tablets (500 mg total) by mouth 2 (two) times daily with a meal.  180 tablet  5  . metoprolol (LOPRESSOR) 100 MG tablet TAKE 1 AND 1/2 TABLETS BY MOUTH TWICE DAILY  135 tablet  5  . pravastatin (PRAVACHOL) 20 MG tablet TAKE 1 TABLET BY MOUTH DAILY  90 tablet  3  . diphenhydrAMINE (BENADRYL) 25 mg capsule Take 1 capsule (25 mg total) by mouth at  bedtime as needed for allergies or sleep.  24 capsule  1  . nicotine (NICODERM CQ - DOSED IN MG/24 HOURS) 21 mg/24hr patch Place 1 patch onto the skin daily.  30 patch  1    Exam:  BP 118/80  Pulse 68  Temp(Src) 98.4 F (36.9 C) (Oral)  Resp 18  SpO2 96% Gen: Well NAD HEENT: EOMI,  MMM Lungs: CTABL Nl WOB Heart: Regular rate and rhythm. Soft systolic murmur heard in bilateral upper sternal borders with mild radiation to the neck Chest wall nontender to palpation Abd: NABS, NT, ND Exts: Non edematous BL  LE, warm and well perfused.   No results found for this or any previous visit (from the past 24 hour(s)). Dg Chest 2 View  03/01/2013   CLINICAL DATA:  Chest pain.  EXAM: CHEST  2 VIEW  COMPARISON:  CHEST x-ray 08/21/2010.  FINDINGS: Lung volumes are normal. No consolidative airspace disease. No pleural effusions. No pneumothorax. No pulmonary nodule or mass noted. Pulmonary vasculature and the cardiomediastinal silhouette are within normal limits.  IMPRESSION: 1.  No radiographic evidence of acute cardiopulmonary disease.   Electronically Signed   By: Trudie Reed M.D.   On: 03/01/2013 11:19    Twelve-lead EKG shows sinus rhythm at 66 beats per minute. QRS duration is 86. Plan T waves  in the lateral precordial leads. No ST segment elevations or depression. Possible age indeterminate septal infarct. Not much change from previous EKGs  Assessment and Plan: 52 y.o. male with central pleuritic-type chest pain. Patient has a slightly changed EKG and a normal chest x-ray. He has a history of 2 CVAs, hypertension and hyperlipidemia. I cannot rule out MI at this time. Plan to transfer to ER for further evaluation and management. Discussed warning signs or symptoms. Please see discharge instructions. Patient expresses understanding.      Rodolph Bong, MD 03/01/13 [phone]

## 2013-03-01 NOTE — ED Notes (Signed)
Dr. Belfi at bedside 

## 2013-03-01 NOTE — ED Notes (Signed)
Pt sent from [facility] for further eval of mid sternal CP worse with cough into back; pt sts recent congestion and URI sx

## 2013-03-01 NOTE — H&P (Signed)
Date: 03/01/2013               Patient Name:  Ronald Marsh [name]: [id number]  DOB: [birth year] Age / Sex: 52 y.o., male   PCP: Darden Palmer, MD         Medical Service: Internal Medicine Teaching Service         Attending Physician: Dr. Rolan Bucco, MD    First Contact: Dr. Boykin Peek   Pager: [phone]  Second Contact: Dr. Bosie Clos  Pager: [phone]        After Hours (After 5p/  First Contact Pager: [phone]  weekends / holidays): Second Contact Pager: [phone]   Chief Complaint: Chest Pain  History of Present Illness: Ronald Marsh is a 52 yo male with a PMH of DM2, HTN, hyperlipidemia, and CVA. He presents with non-exertional substernal CP radiating to his back that began this morning.  He states the pain is associated with a productive cough that began on Wednesday after he kept his grandson that had been ill with cough. He states the pain is worse when he takes a deep breath and is worse with cough. He denies any associated SOB, diaphoresis, N/V, or presyncopal/syncopal episodes. He denies any HA, fever/chills, D/C, or leg swelling.  He reports he quit smoking 2 days ago but smoked about 1.5 ppd for 37 years. He also reports having significant GERD symptoms that "comes and goes." He states he sometimes has a brackish taste in his mouth. He denies ever having an EGD. He reports having used "reefer" several years ago but denies any current use of any other recreational drugs.   Meds:   Current Facility-Administered Medications  Medication Dose Route Frequency Provider Last Rate Last Dose  . benzonatate (TESSALON) capsule 200 mg  200 mg Oral BID Manuela Schwartz, MD   200 mg at 03/01/13 1748  . chlorpheniramine-HYDROcodone (TUSSIONEX) 10-8 MG/5ML suspension 5 mL  5 mL Oral Q12H Manuela Schwartz, MD   5 mL at 03/01/13 1750  . [START ON 03/02/2013] insulin aspart (novoLOG) injection 0-15 Units  0-15 Units Subcutaneous TID WC Manuela Schwartz, MD      .  nitroGLYCERIN (NITROSTAT) SL tablet 0.4 mg  0.4 mg Sublingual Q5 min PRN Rolan Bucco, MD   0.4 mg at 03/01/13 1414  . pantoprazole (PROTONIX) EC tablet 40 mg  40 mg Oral Daily Manuela Schwartz, MD   40 mg at 03/01/13 1748   Current Outpatient Prescriptions  Medication Sig Dispense Refill  . amLODipine (NORVASC) 10 MG tablet Take 10 mg by mouth daily.      Marland Kitchen aspirin 81 MG tablet Take 81 mg by mouth daily.      . cloNIDine (CATAPRES) 0.2 MG tablet Take 0.2 mg by mouth 2 (two) times daily.      . diphenhydrAMINE (BENADRYL) 25 mg capsule Take 1 capsule (25 mg total) by mouth at bedtime as needed for allergies or sleep.  24 capsule  1  . metFORMIN (GLUCOPHAGE) 1000 MG tablet Take 0.5 tablets (500 mg total) by mouth 2 (two) times daily with a meal.  180 tablet  5  . metoprolol (LOPRESSOR) 100 MG tablet Take 150 mg by mouth 2 (two) times daily.      . pravastatin (PRAVACHOL) 20 MG tablet Take 20 mg by mouth daily.      Marland Kitchen terbinafine (LAMISIL) 250 MG tablet Take 250 mg by mouth daily.        Allergies: Allergies as of 03/01/2013 - Review  Complete 03/01/2013  Allergen Reaction Noted  . Benicar [olmesartan] Anaphylaxis and Swelling 03/01/2013  . Ace inhibitors Swelling 04/24/2012  . Angiotensin receptor blockers Swelling 04/24/2012   Past Medical History  Diagnosis Date  . Tobacco abuse   . Hypertension   . Hyperlipidemia   . History of CVA (cerebrovascular accident) 02/2010    Left periventricular subcortical ischemic infarction // Residual RUE and RLE weakness  . Internal carotid artery stenosis     BL and mild per CT angiogram (05/2010)  . History of depression     surrounding original stroke - has since resolved (04/2012)  . Prediabetes   . Diabetes mellitus without complication    History reviewed. No pertinent past surgical history. Family History  Problem Relation Age of Onset  . Hypertension Brother   . Diabetes Mother   . Heart failure Mother   . Hypertension Mother    . Hyperlipidemia Mother   . Lung cancer Father   . Diabetes Sister   . Arthritis Sister    History   Social History  . Marital Status: Married    Spouse Name: N/A    Number of Children: 1  . Years of Education: 11th grade   Occupational History  . On disability     previously worked for [employer] until his stroke in 2011.    Social History Main Topics  . Smoking status: Former Smoker -- 0.30 packs/day for 31 years    Types: Cigarettes    Start date: 04/25/1981  . Smokeless tobacco: Not on file     Comment: up to 3 cigs/day; cutting back.  . Alcohol Use: No  . Drug Use: No  . Sexual Activity: Yes   Other Topics Concern  . Not on file   Social History Narrative   Lives in [place] with wife.   Has been incarcerated multiple times, last time was in 2012 for 8 months.    Review of Systems: Pertinent items are noted in HPI.  Physical Exam: Blood pressure 159/92, pulse 70, resp. rate 18, SpO2 98.00%. Constitutional: Vital signs reviewed.  Patient is a well-developed and well-nourished male in no acute distress and cooperative with exam.  Head: Normocephalic and atraumatic Nose: No erythema or drainage noted.   Eyes: PERRL, EOMI, conjunctivae normal, No scleral icterus.  Neck: Supple, Trachea midline normal ROM, No JVD noted Cardiovascular: RRR, S1 normal, S2 normal, no MRG, pulses symmetric and intact bilaterally Pulmonary/Chest: non-tender to palpation, normal respiratory effort, CTAB, no wheezes, rales, or rhonchi Abdominal: Soft. Non-tender, non-distended, bowel sounds are normal  Neurological: A&O x3, cranial nerve II-XII are grossly intact,  Skin: Warm, dry and intact. No rash, cyanosis, or clubbing.  Extremities: Left ankle bracelet (pt under house arrest) Psychiatric: Normal mood and affect.    Lab results: Basic Metabolic Panel:  Recent Labs  16/10/96 1313  NA 141  K 4.2  CL 103  GLUCOSE 101*  BUN 10  CREATININE 1.00   Liver Function  Tests: No results found for this basename: AST, ALT, ALKPHOS, BILITOT, PROT, ALBUMIN,  in the last 72 hours No results found for this basename: LIPASE, AMYLASE,  in the last 72 hours No results found for this basename: AMMONIA,  in the last 72 hours CBC:  Recent Labs  03/01/13 1248 03/01/13 1313  WBC 8.1  --   NEUTROABS 5.4  --   HGB 14.1 15.0  HCT 40.5 44.0  MCV 91.8  --   PLT 207  --  Cardiac Enzymes:  Recent Labs  03/01/13 1248  TROPONINI <0.30   BNP: No results found for this basename: PROBNP,  in the last 72 hours D-Dimer: No results found for this basename: DDIMER,  in the last 72 hours CBG:  Recent Labs  03/01/13 1205  GLUCAP 94   Hemoglobin A1C: No results found for this basename: HGBA1C,  in the last 72 hours Fasting Lipid Panel: No results found for this basename: CHOL, HDL, LDLCALC, TRIG, CHOLHDL, LDLDIRECT,  in the last 72 hours Thyroid Function Tests: No results found for this basename: TSH, T4TOTAL, FREET4, T3FREE, THYROIDAB,  in the last 72 hours Anemia Panel: No results found for this basename: VITAMINB12, FOLATE, FERRITIN, TIBC, IRON, RETICCTPCT,  in the last 72 hours Coagulation: No results found for this basename: LABPROT, INR,  in the last 72 hours Urine Drug Screen: Drugs of Abuse     Component Value Date/Time   LABOPIA NONE DETECTED 06/01/2010 1845   LABOPIA NEGATIVE 02/27/2010 1250   COCAINSCRNUR NONE DETECTED 06/01/2010 1845   COCAINSCRNUR NEGATIVE 02/27/2010 1250   LABBENZ NONE DETECTED 06/01/2010 1845   LABBENZ NEGATIVE 02/27/2010 1250   AMPHETMU NONE DETECTED 06/01/2010 1845   AMPHETMU NEGATIVE 02/27/2010 1250   THCU NONE DETECTED 06/01/2010 1845   LABBARB  Value: NONE DETECTED        DRUG SCREEN FOR MEDICAL PURPOSES ONLY.  IF CONFIRMATION IS NEEDED FOR ANY PURPOSE, NOTIFY LAB WITHIN 5 DAYS.        LOWEST DETECTABLE LIMITS FOR URINE DRUG SCREEN Drug Class       Cutoff (ng/mL) Amphetamine      1000 Barbiturate      200 Benzodiazepine    200 Tricyclics       300 Opiates          300 Cocaine          300 THC              50 06/01/2010 1845    Alcohol Level: No results found for this basename: ETH,  in the last 72 hours Urinalysis: No results found for this basename: COLORURINE, APPERANCEUR, LABSPEC, PHURINE, GLUCOSEU, HGBUR, BILIRUBINUR, KETONESUR, PROTEINUR, UROBILINOGEN, NITRITE, LEUKOCYTESUR,  in the last 72 hours  Imaging results:  Dg Chest 2 View  03/01/2013   CLINICAL DATA:  Chest pain.  EXAM: CHEST  2 VIEW  COMPARISON:  CHEST x-ray 08/21/2010.  FINDINGS: Lung volumes are normal. No consolidative airspace disease. No pleural effusions. No pneumothorax. No pulmonary nodule or mass noted. Pulmonary vasculature and the cardiomediastinal silhouette are within normal limits.  IMPRESSION: 1.  No radiographic evidence of acute cardiopulmonary disease.   Electronically Signed   By: Trudie Reed M.D.   On: 03/01/2013 11:19   Ct Angio Chest Pe W/cm &/or Wo Cm  03/01/2013   CLINICAL DATA:  Pain, possible pulmonary embolus or aortic injury  EXAM: CT ANGIOGRAPHY CHEST WITH CONTRAST  TECHNIQUE: Multidetector CT imaging of the chest was performed using the standard protocol during bolus administration of intravenous contrast. Multiplanar CT image reconstructions including MIPs were obtained to evaluate the vascular anatomy.  CONTRAST:  75mL OMNIPAQUE IOHEXOL 350 MG/ML SOLN  COMPARISON:  None.  FINDINGS: Sagittal images of the spine shows mild degenerative changes thoracic spine. Sagittal view of the sternum is unremarkable.  Images of the thoracic inlet are unremarkable. Central airways are patent.  There is no mediastinal hematoma or adenopathy.  No hilar adenopathy.  Heart size within normal limits.  No pericardial effusion.  There is no aortic aneurysm. Ascending aorta measures 3 cm in diameter. Descending aorta measures 3 cm in diameter. No aortic dissection. Mild atherosclerotic calcifications of coronary arteries are noted.  The study  is of excellent technical quality. No pulmonary embolus is noted. Bilateral perihilar mild peribronchial thickening. Mild bronchitic changes cannot be excluded.  Images of the lung parenchyma shows no acute infiltrate or pleural effusion. No pulmonary edema. Minimal atelectasis noted lung bases posteriorly. No pulmonary nodules are noted.  Visualized upper abdomen is unremarkable. No adrenal gland mass is noted.  Review of the MIP images confirms the above findings.  IMPRESSION: 1. No pulmonary embolus is noted. No mediastinal hematoma or adenopathy. 2. No aortic aneurysm or dissection. 3. Mild atherosclerotic calcifications of coronary arteries. 4. No acute infiltrate or pulmonary edema. Mild perihilar peribronchial thickening. Mild bronchitic changes cannot be excluded.   Electronically Signed   By: Natasha Mead M.D.   On: 03/01/2013 14:30    Other results:     Assessment & Plan by Problem:  Mr. Didonato is a 52 yo male with a PMH of DM2, HTN, hyperlipidemia, and CVA who presents with substernal CP radiating to his back that began this morning.  Principal Problem:   Pleuritic chest pain Active Problems:   Tobacco abuse   Hypertension   Hyperlipidemia   History of CVA (cerebrovascular accident)   Diabetes mellitus type 2, controlled   GERD (gastroesophageal reflux disease)  # Pleuritic chest pain-Pt has substernal CP radiating to the back that began this morning associated with cough and taking a deep breath. He denies any associated SOB, N/V, diaphoresis, or pain with exertion. He does have a h/o DM2, HTN, CVA, and hyperlipidemia which puts him at risk for ACS. TIMI score: 3, which indicates a 13% risk at 14 days of: all-cause mortality, new or recurrent MI, or severe recurrent ischemia requiring urgent revascularization. A CXR revealed no acute cardiopulmonary disease. Wells Score: 0. A CTA revealed no PE, no aortic aneurysm or dissection. Differential includes: ACS, PE, Pneumothorax, Aortic  Dissection, GERD, Pneumonia (pt is afebrile with nl VS), Costcochondritis.  Most likely etiology is viral URI. It is not likely cardiac given non-exertional, non-radiating, and pleuritic in nature.   -troponins X3 -Lipid Panel, CBC, CMP -BNP (if elevated will get TTE to assess LVF) -GI cocktail -EKG in am  -NTG -ASA 324mg  (given in ED) -Ibuprofen 800mg  tid, then PRN  #Cough-Most likely viral etiology given recent sick contact.  -tessalon 200mg  bid  -tussionex   #Tobacco abuse-Pt reports he quit smoking 2 days ago.   #Hypertension-BP upon admission 146/91.  -Continue home meds  #Hyperlipidemia-Last LP 04/25/12. Pt is on pravastatin 20mg  at home.  Lab Results  Component Value Date   CHOL 130 04/25/2012   HDL 26* 04/25/2012   LDLCALC 31 04/25/2012   TRIG 363* 04/25/2012   CHOLHDL 5.0 04/25/2012   -switch to simvastatin 40mg    #History of CVA (cerebrovascular accident)-Stable.   #Diabetes mellitus type 2, controlled-Pt on metformin 1000mg  daily. Last HA1C: 01/22/13 Lab Results  Component Value Date   HGBA1C 5.5 01/22/2013   -d/c metformin while inpatient -SSI moderate  #GERD (gastroesophageal reflux disease)-Pt c/o symptoms. Not currently on PPI at home.  -GI cocktail -protonix 40mg    Dispo: Disposition is deferred at this time, awaiting improvement of current medical problems. Anticipated discharge in approximately 1-2 day(s).   The patient does have a current PCP Darden Palmer, MD) and does need an [facility] hospital follow-up appointment after discharge.  Signed: Boykin Peek, MD 03/01/2013, 6:18 PM

## 2013-03-01 NOTE — Discharge Summary (Signed)
Name: Ronald Marsh [name]: [id number] DOB: [birth year] 52 y.o. PCP: Darden Palmer, MD  Date of Admission: 03/01/2013 11:53 AM Date of Discharge: 03/01/2013 Attending Physician: Rolan Bucco, MD  Discharge Diagnosis: Principal Problem:   Pleuritic chest pain Active Problems:   Tobacco abuse   Hypertension   Hyperlipidemia   History of CVA (cerebrovascular accident)   Diabetes mellitus type 2, controlled   GERD (gastroesophageal reflux disease)  Discharge Medications:   Medication List    ASK your doctor about these medications       amLODipine 10 MG tablet  Commonly known as:  NORVASC  Take 10 mg by mouth daily.     aspirin 81 MG tablet  Take 81 mg by mouth daily.     cloNIDine 0.2 MG tablet  Commonly known as:  CATAPRES  Take 0.2 mg by mouth 2 (two) times daily.     diphenhydrAMINE 25 mg capsule  Commonly known as:  BENADRYL  Take 1 capsule (25 mg total) by mouth at bedtime as needed for allergies or sleep.     metFORMIN 1000 MG tablet  Commonly known as:  GLUCOPHAGE  Take 0.5 tablets (500 mg total) by mouth 2 (two) times daily with a meal.     metoprolol 100 MG tablet  Commonly known as:  LOPRESSOR  Take 150 mg by mouth 2 (two) times daily.     pravastatin 20 MG tablet  Commonly known as:  PRAVACHOL  Take 20 mg by mouth daily.     terbinafine 250 MG tablet  Commonly known as:  LAMISIL  Take 250 mg by mouth daily.        Disposition and follow-up:   Ronald Marsh was discharged from [facility] in Good condition.  At the hospital follow up visit please address:  1.  Resolution of cough, BP control  2.  Labs / imaging needed at time of follow-up: None  3.  Pending labs/ test needing follow-up: None  Follow-up Appointments:   Discharge Instructions:   Consultations:  None  Procedures Performed:  Dg Chest 2 View  03/01/2013   CLINICAL DATA:  Chest pain.  EXAM: CHEST  2 VIEW  COMPARISON:  CHEST x-ray 08/21/2010.   FINDINGS: Lung volumes are normal. No consolidative airspace disease. No pleural effusions. No pneumothorax. No pulmonary nodule or mass noted. Pulmonary vasculature and the cardiomediastinal silhouette are within normal limits.  IMPRESSION: 1.  No radiographic evidence of acute cardiopulmonary disease.   Electronically Signed   By: Trudie Reed M.D.   On: 03/01/2013 11:19   Ct Angio Chest Pe W/cm &/or Wo Cm  03/01/2013   CLINICAL DATA:  Pain, possible pulmonary embolus or aortic injury  EXAM: CT ANGIOGRAPHY CHEST WITH CONTRAST  TECHNIQUE: Multidetector CT imaging of the chest was performed using the standard protocol during bolus administration of intravenous contrast. Multiplanar CT image reconstructions including MIPs were obtained to evaluate the vascular anatomy.  CONTRAST:  75mL OMNIPAQUE IOHEXOL 350 MG/ML SOLN  COMPARISON:  None.  FINDINGS: Sagittal images of the spine shows mild degenerative changes thoracic spine. Sagittal view of the sternum is unremarkable.  Images of the thoracic inlet are unremarkable. Central airways are patent.  There is no mediastinal hematoma or adenopathy.  No hilar adenopathy.  Heart size within normal limits.  No pericardial effusion.  There is no aortic aneurysm. Ascending aorta measures 3 cm in diameter. Descending aorta measures 3 cm in diameter. No aortic dissection. Mild atherosclerotic calcifications of coronary arteries are  noted.  The study is of excellent technical quality. No pulmonary embolus is noted. Bilateral perihilar mild peribronchial thickening. Mild bronchitic changes cannot be excluded.  Images of the lung parenchyma shows no acute infiltrate or pleural effusion. No pulmonary edema. Minimal atelectasis noted lung bases posteriorly. No pulmonary nodules are noted.  Visualized upper abdomen is unremarkable. No adrenal gland mass is noted.  Review of the MIP images confirms the above findings.  IMPRESSION: 1. No pulmonary embolus is noted. No mediastinal  hematoma or adenopathy. 2. No aortic aneurysm or dissection. 3. Mild atherosclerotic calcifications of coronary arteries. 4. No acute infiltrate or pulmonary edema. Mild perihilar peribronchial thickening. Mild bronchitic changes cannot be excluded.   Electronically Signed   By: Natasha Mead M.D.   On: 03/01/2013 14:30    2D Echo: None  Cardiac Cath: None  Admission HPI: Ronald Marsh is a 52 yo male with a PMH of DM2, HTN, hyperlipidemia, and CVA. He presents with non-exertional substernal CP radiating to his back that began this morning. He states the pain is associated with a productive cough that began on Wednesday after he kept his grandson that had been ill with cough. He states the pain is worse when he takes a deep breath and is worse with cough. He denies any associated SOB, diaphoresis, N/V, or presyncopal/syncopal episodes. He denies any HA, fever/chills, D/C, or leg swelling. He reports he quit smoking 2 days ago but smoked about 1.5 ppd for 37 years. He also reports having significant GERD symptoms that "comes and goes." He states he sometimes has a brackish taste in his mouth. He denies ever having an EGD. He reports having used "reefer" several years ago but denies any current use of any other recreational drugs.   Hospital Course by problem list: Principal Problem:   Pleuritic chest pain Active Problems:   Tobacco abuse   Hypertension   Hyperlipidemia   History of CVA (cerebrovascular accident)   Diabetes mellitus type 2, controlled   GERD (gastroesophageal reflux disease)   # Pleuritic chest pain-Pt had substernal CP radiating to the back that began this morning associated with cough and taking a deep breath. He denies any associated SOB, N/V, diaphoresis, or pain with exertion. He does have a h/o DM2, HTN, CVA, and hyperlipidemia which puts him at risk for ACS. TIMI score: 3, which indicates a 13% risk at 14 days of: all-cause mortality, new or recurrent MI, or severe recurrent  ischemia requiring urgent revascularization. A CXR revealed no acute cardiopulmonary disease. Wells Score: 0. CTA revealed no PE, no aortic aneurysm or dissection. He was given a full dose ASA in the ED.  Differential includes: ACS, PE, Pneumothorax, Aortic Dissection, GERD, Pneumonia (pt is afebrile with nl VS), Costcochondritis. Most likely etiology is viral URI. It is not likely cardiac given non-exertional, non-radiating, and pleuritic in nature. We cycled troponins X3 which were all negative. We obtained a BNP which was nl at 82.6. He received a GI cocktail, NTG, and ibuprofen 800mg  tid for one day. A repeat EKG in the am showed no acute changes.  He was discharged the following day in good condition with significant improvement in his CP and cough.  #Cough-Most likely viral etiology given recent sick contact. He was given tessalon 200mg  bid and tussionex with improvement of cough.  He was d/c with a short supply of tessalon.  #Tobacco abuse-Pt reports he quit smoking 2 days ago.  #Hypertension-BP upon admission 146/91. We continued home meds.   #Hyperlipidemia-Last  LP 04/25/12. Pt is on pravastatin 20mg  at home and was continued.  Lab Results   Component  Value  Date    CHOL  130  04/25/2012    HDL  26*  04/25/2012    LDLCALC  31  04/25/2012    TRIG  363*  04/25/2012    CHOLHDL  5.0  04/25/2012     #History of CVA (cerebrovascular accident)-Stable.  #Diabetes mellitus type 2, controlled-Pt on metformin 1000mg  daily. Last HA1C: 01/22/13. We d/c metformin while an inpatient and he was put on a moderate SSI.  Lab Results   Component  Value  Date    HGBA1C  5.5  01/22/2013    #GERD (gastroesophageal reflux disease)-Pt c/o symptoms. Not currently on PPI at home. He was given a GI cocktail and placed on protonix 40mg  while an inpatient.  Consider adding a PPI as an outpatient if pt continues to have reflux.     Discharge Vitals:   BP 159/92  Pulse 70  Resp 18  SpO2 98%  Discharge Labs:    Results for orders placed during the hospital encounter of 03/01/13 (from the past 24 hour(s))  GLUCOSE, CAPILLARY     Status: None   Collection Time    03/01/13 12:05 PM      Result Value Range   Glucose-Capillary 94  70 - 99 mg/dL   Comment 1 Documented in Chart     Comment 2 Notify RN    CBC WITH DIFFERENTIAL     Status: None   Collection Time    03/01/13 12:48 PM      Result Value Range   WBC 8.1  4.0 - 10.5 K/uL   RBC 4.41  4.22 - 5.81 MIL/uL   Hemoglobin 14.1  13.0 - 17.0 g/dL   HCT 09.6  04.5 - 40.9 %   MCV 91.8  78.0 - 100.0 fL   MCH 32.0  26.0 - 34.0 pg   MCHC 34.8  30.0 - 36.0 g/dL   RDW 81.1  91.4 - 78.2 %   Platelets 207  150 - 400 K/uL   Neutrophils Relative % 66  43 - 77 %   Neutro Abs 5.4  1.7 - 7.7 K/uL   Lymphocytes Relative 21  12 - 46 %   Lymphs Abs 1.7  0.7 - 4.0 K/uL   Monocytes Relative 11  3 - 12 %   Monocytes Absolute 0.9  0.1 - 1.0 K/uL   Eosinophils Relative 2  0 - 5 %   Eosinophils Absolute 0.2  0.0 - 0.7 K/uL   Basophils Relative 0  0 - 1 %   Basophils Absolute 0.0  0.0 - 0.1 K/uL  TROPONIN I     Status: None   Collection Time    03/01/13 12:48 PM      Result Value Range   Troponin I <0.30  <0.30 ng/mL  POCT I-STAT TROPONIN I     Status: None   Collection Time    03/01/13  1:11 PM      Result Value Range   Troponin i, poc 0.00  0.00 - 0.08 ng/mL   Comment 3           POCT I-STAT, CHEM 8     Status: Abnormal   Collection Time    03/01/13  1:13 PM      Result Value Range   Sodium 141  135 - 145 mEq/L   Potassium 4.2  3.5 - 5.1 mEq/L  Chloride 103  96 - 112 mEq/L   BUN 10  6 - 23 mg/dL   Creatinine, Ser 1.61  0.50 - 1.35 mg/dL   Glucose, Bld 096 (*) 70 - 99 mg/dL   Calcium, Ion 0.45 (*) 1.12 - 1.23 mmol/L   TCO2 27  0 - 100 mmol/L   Hemoglobin 15.0  13.0 - 17.0 g/dL   HCT 40.9  81.1 - 91.4 %  POCT I-STAT TROPONIN I     Status: None   Collection Time    03/01/13  4:58 PM      Result Value Range   Troponin i, poc 0.00  0.00 - 0.08  ng/mL   Comment 3             Signed: Boykin Peek, MD 03/01/2013, 6:25 PM   Time Spent on Discharge: 45 minutes Services Ordered on Discharge: None Equipment Ordered on Discharge: None

## 2013-03-01 NOTE — ED Provider Notes (Signed)
CSN: [id number]     Arrival date & time 03/01/13  1145 History   First MD Initiated Contact with Patient 03/01/13 1218     Chief Complaint  Patient presents with  . Chest Pain  . Back Pain   (Consider location/radiation/quality/duration/timing/severity/associated sxs/prior Treatment) HPI Comments: Patient presents with chest pain. He was in the urgent care with a chest pain that he describes as aching pain between her shoulder blades. At urgent care he described as sharp however he describes it to me as an aching pain as between his shoulder blades and radiates through to his chest wall. He states it's been fairly constant for the last 2 days. It is usually worse with coughing and occasionally it's worse with moving. He has some shortness of breath on exertion but his chest pain does not worsen with exertion. He denies any leg pain or swelling. He denies any history of heart problems in the past. He denies any family history of early cardiac disease. He denies any history of blood clots in the past. He denies any nausea vomiting or diaphoresis. He's had a cough that started yesterday which is nonproductive.  Patient is a 52 y.o. male presenting with chest pain and back pain.  Chest Pain Associated symptoms: back pain and shortness of breath   Associated symptoms: no abdominal pain, no cough, no diaphoresis, no dizziness, no fatigue, no fever, no headache, no nausea, no numbness, not vomiting and no weakness   Back Pain Associated symptoms: chest pain   Associated symptoms: no abdominal pain, no fever, no headaches, no numbness and no weakness     Past Medical History  Diagnosis Date  . Tobacco abuse   . Hypertension   . Hyperlipidemia   . History of CVA (cerebrovascular accident) 02/2010    Left periventricular subcortical ischemic infarction // Residual RUE and RLE weakness  . Internal carotid artery stenosis     BL and mild per CT angiogram (05/2010)  . History of depression      surrounding original stroke - has since resolved (04/2012)  . Prediabetes   . Diabetes mellitus without complication    History reviewed. No pertinent past surgical history. Family History  Problem Relation Age of Onset  . Hypertension Brother   . Diabetes Mother   . Heart failure Mother   . Hypertension Mother   . Hyperlipidemia Mother   . Lung cancer Father   . Diabetes Sister   . Arthritis Sister    History  Substance Use Topics  . Smoking status: Former Smoker -- 0.30 packs/day for 31 years    Types: Cigarettes    Start date: 04/25/1981  . Smokeless tobacco: Not on file     Comment: up to 3 cigs/day; cutting back.  . Alcohol Use: No    Review of Systems  Constitutional: Negative for fever, chills, diaphoresis and fatigue.  HENT: Negative for congestion, rhinorrhea and sneezing.   Eyes: Negative.   Respiratory: Positive for shortness of breath. Negative for cough and chest tightness.   Cardiovascular: Positive for chest pain. Negative for leg swelling.  Gastrointestinal: Negative for nausea, vomiting, abdominal pain, diarrhea and blood in stool.  Genitourinary: Negative for frequency, hematuria, flank pain and difficulty urinating.  Musculoskeletal: Positive for back pain. Negative for arthralgias.  Skin: Negative for rash.  Neurological: Negative for dizziness, speech difficulty, weakness, numbness and headaches.    Allergies  Benicar; Ace inhibitors; and Angiotensin receptor blockers  Home Medications   Current Outpatient Rx  Name  Route  Sig  Dispense  Refill  . amLODipine (NORVASC) 10 MG tablet   Oral   Take 10 mg by mouth daily.         Marland Kitchen aspirin 81 MG tablet   Oral   Take 81 mg by mouth daily.         . cloNIDine (CATAPRES) 0.2 MG tablet   Oral   Take 0.2 mg by mouth 2 (two) times daily.         . diphenhydrAMINE (BENADRYL) 25 mg capsule   Oral   Take 1 capsule (25 mg total) by mouth at bedtime as needed for allergies or sleep.   24  capsule   1   . metFORMIN (GLUCOPHAGE) 1000 MG tablet   Oral   Take 0.5 tablets (500 mg total) by mouth 2 (two) times daily with a meal.   180 tablet   5   . metoprolol (LOPRESSOR) 100 MG tablet   Oral   Take 150 mg by mouth 2 (two) times daily.         . pravastatin (PRAVACHOL) 20 MG tablet   Oral   Take 20 mg by mouth daily.         Marland Kitchen terbinafine (LAMISIL) 250 MG tablet   Oral   Take 250 mg by mouth daily.          BP 131/83  Pulse 57  Resp 25  SpO2 97% Physical Exam  Constitutional: He is oriented to person, place, and time. He appears well-developed and well-nourished.  HENT:  Head: Normocephalic and atraumatic.  Eyes: Pupils are equal, round, and reactive to light.  Neck: Normal range of motion. Neck supple.  Cardiovascular: Normal rate, regular rhythm and normal heart sounds.   Pulmonary/Chest: Effort normal and breath sounds normal. No respiratory distress. He has no wheezes. He has no rales. He exhibits no tenderness.  Abdominal: Soft. Bowel sounds are normal. There is no tenderness. There is no rebound and no guarding.  Musculoskeletal: Normal range of motion. He exhibits no edema.  No calf tenderness  Lymphadenopathy:    He has no cervical adenopathy.  Neurological: He is alert and oriented to person, place, and time.  Skin: Skin is warm and dry. No rash noted.  Psychiatric: He has a normal mood and affect.    ED Course  Procedures (including critical care time) Labs Review Labs Reviewed  POCT I-STAT, CHEM 8 - Abnormal; Notable for the following:    Glucose, Bld 101 (*)    Calcium, Ion 1.24 (*)    All other components within normal limits  GLUCOSE, CAPILLARY  CBC WITH DIFFERENTIAL  TROPONIN I  POCT I-STAT TROPONIN I   Imaging Review Dg Chest 2 View  03/01/2013   CLINICAL DATA:  Chest pain.  EXAM: CHEST  2 VIEW  COMPARISON:  CHEST x-ray 08/21/2010.  FINDINGS: Lung volumes are normal. No consolidative airspace disease. No pleural effusions. No  pneumothorax. No pulmonary nodule or mass noted. Pulmonary vasculature and the cardiomediastinal silhouette are within normal limits.  IMPRESSION: 1.  No radiographic evidence of acute cardiopulmonary disease.   Electronically Signed   By: Trudie Reed M.D.   On: 03/01/2013 11:19   Ct Angio Chest Pe W/cm &/or Wo Cm  03/01/2013   CLINICAL DATA:  Pain, possible pulmonary embolus or aortic injury  EXAM: CT ANGIOGRAPHY CHEST WITH CONTRAST  TECHNIQUE: Multidetector CT imaging of the chest was performed using the standard protocol during bolus administration of intravenous contrast. Multiplanar  CT image reconstructions including MIPs were obtained to evaluate the vascular anatomy.  CONTRAST:  75mL OMNIPAQUE IOHEXOL 350 MG/ML SOLN  COMPARISON:  None.  FINDINGS: Sagittal images of the spine shows mild degenerative changes thoracic spine. Sagittal view of the sternum is unremarkable.  Images of the thoracic inlet are unremarkable. Central airways are patent.  There is no mediastinal hematoma or adenopathy.  No hilar adenopathy.  Heart size within normal limits.  No pericardial effusion.  There is no aortic aneurysm. Ascending aorta measures 3 cm in diameter. Descending aorta measures 3 cm in diameter. No aortic dissection. Mild atherosclerotic calcifications of coronary arteries are noted.  The study is of excellent technical quality. No pulmonary embolus is noted. Bilateral perihilar mild peribronchial thickening. Mild bronchitic changes cannot be excluded.  Images of the lung parenchyma shows no acute infiltrate or pleural effusion. No pulmonary edema. Minimal atelectasis noted lung bases posteriorly. No pulmonary nodules are noted.  Visualized upper abdomen is unremarkable. No adrenal gland mass is noted.  Review of the MIP images confirms the above findings.  IMPRESSION: 1. No pulmonary embolus is noted. No mediastinal hematoma or adenopathy. 2. No aortic aneurysm or dissection. 3. Mild atherosclerotic  calcifications of coronary arteries. 4. No acute infiltrate or pulmonary edema. Mild perihilar peribronchial thickening. Mild bronchitic changes cannot be excluded.   Electronically Signed   By: Natasha Mead M.D.   On: 03/01/2013 14:30    EKG Interpretation     Ventricular Rate:  60 PR Interval:  202 QRS Duration: 91 QT Interval:  409 QTC Calculation: 409 R Axis:   -9 Text Interpretation:  Sinus rhythm Borderline prolonged PR interval Borderline T abnormalities, inferior leads since last tracing no significant change            MDM   1. Chest pain    Patient with a history of diabetes hypertension and hyperlipidemia presents with pain between her shoulder blades for the last 2 days. He does have some exertional dyspnea. His EKG does show some changes including ST depression and T-wave inversion inferiorly. His first troponin is negative. He has no evidence of aortic aneurysm/dissection or pulmonary embolus. Given his significant risk factors I do feel that he should be admitted for further cardiac evaluation. He was pain free after nitroglycerin and was given aspirin in the ED.    Rolan Bucco, MD 03/01/13 [phone]

## 2013-03-01 NOTE — ED Notes (Signed)
Internal Medicine at bedside.  

## 2013-03-01 NOTE — ED Notes (Signed)
Reports chest pain with sob. Onset Wednesday.  States pain radiates to center of back. Pain is also felt with deep breathing.   Denies n/v/d.  No otc meds taken for pain.

## 2013-03-02 DIAGNOSIS — R079 Chest pain, unspecified: Secondary | ICD-10-CM

## 2013-03-02 DIAGNOSIS — E119 Type 2 diabetes mellitus without complications: Secondary | ICD-10-CM

## 2013-03-02 LAB — PRO B NATRIURETIC PEPTIDE: Pro B Natriuretic peptide (BNP): 82.6 pg/mL (ref 0–125)

## 2013-03-02 LAB — HIV ANTIBODY (ROUTINE TESTING W REFLEX): HIV: NONREACTIVE

## 2013-03-02 LAB — TROPONIN I: Troponin I: <0.3 ng/mL (ref <0.30)

## 2013-03-02 LAB — GLUCOSE, CAPILLARY: Glucose-Capillary: 144 mg/dL — ABNORMAL HIGH (ref 70–99)

## 2013-03-02 MED ORDER — PRAVASTATIN SODIUM 40 MG PO TABS
80.0000 mg | ORAL_TABLET | Freq: Every day | ORAL | Status: DC
  Filled 2013-03-02: qty 2

## 2013-03-02 MED ORDER — BENZONATATE 100 MG PO CAPS: 200.0000 mg | ORAL_CAPSULE | Freq: Two times a day (BID) | ORAL | Status: DC | PRN

## 2013-03-02 MED ORDER — BENZONATATE 200 MG PO CAPS: 200.0000 mg | ORAL_CAPSULE | Freq: Two times a day (BID) | ORAL | Status: DC

## 2013-03-02 NOTE — H&P (Signed)
  Date: 03/02/2013  Patient name: Ronald Marsh  Medical record number: [id number]  Date of birth: Aug 03, 1960   I have seen and evaluated Ronald Marsh and discussed their care with the Residency Team.   Assessment and Plan: I have seen and evaluated the patient as outlined above. I agree with the formulated Assessment and Plan as detailed in the residents' admission note, with the following changes:   Please see my note as well.   Ginnie Smart, MD 10/25/20149:16 AM

## 2013-03-02 NOTE — H&P (Signed)
  Date: 03/02/2013  Patient name: Ronald Marsh  Medical record number: [id number]  Date of birth: Jun 17, 1960   I have seen and evaluated Ronald Marsh and discussed their care with the Residency Team.   Assessment and Plan: I have seen and evaluated the patient as outlined above. I agree with the formulated Assessment and Plan as detailed in the residents' admission note, with the following changes:   52 yo M with hx of DM2, hypertension and hyperlipidemia. Comes to hospital on 10-24 with SSCP associated with prod cough x 48h. His pain is worse with deep breathing. He has also had sx of GERD, having a foul taste in his mouth.  PMHx: HTN, lipids, CVA 2011, DM2  Soc: 1.5 ppd x 37 yrs.   Filed Vitals:   03/02/13 0400  BP: 120/77  Pulse: 55  Temp: 98 F (36.7 C)  Resp: 20   Eyes: EOMI, PERRL Mouth: without lesoin Neck: nontender, no LAN CV: RRR Chest: CTA Abd: BS+, soft, non-tender.   Labs of note: CXR (-).  CTA: (-)  WBC nl Troponins (-) BNP normal  A/P Pleuritic CP Tobacco use DM2 Previous CVA  Would:  complete cardiac w/u as outpt Check his HIV test GI cocktail Lipid control  Cough suppressants Pain mgmt Glc mgmt  Ginnie Smart, MD 10/25/20149:18 AM

## 2013-03-02 NOTE — Progress Notes (Signed)
Utilization Review Completed.  

## 2013-03-02 NOTE — Progress Notes (Signed)
Wife called after discharge stating that the tessalon script was 35.00 and they couldn't afford that. This nurse spoke with MD who is calling the [facility] where it can be purchased for 10.00. Return call was made to wife who was very grateful for the help in being able to get medication they could afford.

## 2013-03-02 NOTE — Progress Notes (Addendum)
Subjective: Pt reports cough is improved this am and he has no further episodes of CP. He will be discharged today.    Objective: Vital signs in last 24 hours: Filed Vitals:   03/01/13 1800 03/01/13 1918 03/01/13 2357 03/02/13 0400  BP: 141/78 147/84 112/72 120/77  Pulse: 63 67 76 55  Temp:  98.1 F (36.7 C) 98 F (36.7 C) 98 F (36.7 C)  TempSrc:  Oral Oral Oral  Resp: 23 18 18 20   SpO2: 98% 100% 94% 96%   Weight change:   Intake/Output Summary (Last 24 hours) at 03/02/13 1010 Last data filed at 03/02/13 0900  Gross per 24 hour  Intake    540 ml  Output    975 ml  Net   -435 ml   Constitutional: Vital signs reviewed.  Patient is a well-developed and well-nourished male in no acute distress and cooperative with exam.  Head: Normocephalic and atraumatic Eyes: PERRL, EOMI, conjunctivae normal, No scleral icterus.  Neck: Supple, Trachea midline .  Cardiovascular: RRR, S1 normal, S2 normal, no MRG, pulses symmetric and intact bilaterally Pulmonary/Chest: normal respiratory effort, CTAB, no wheezes, rales, or rhonchi Abdominal: Soft. Non-tender, non-distended, bowel sounds are normal, no masses, organomegaly, or guarding present.  Musculoskeletal: No joint deformities, erythema, or stiffness noted.  Neurological: A&O x3, cranial nerve II-XII are grossly intact, no focal motor deficit. Skin: Warm, dry and intact. No rash, cyanosis, or clubbing.  Psychiatric: Normal mood and affect.    Lab Results: Basic Metabolic Panel:  Recent Labs Lab 03/01/13 1313 03/01/13 2155  NA 141  --   K 4.2  --   CL 103  --   GLUCOSE 101*  --   BUN 10  --   CREATININE 1.00 0.86   Liver Function Tests: No results found for this basename: AST, ALT, ALKPHOS, BILITOT, PROT, ALBUMIN,  in the last 168 hours No results found for this basename: LIPASE, AMYLASE,  in the last 168 hours No results found for this basename: AMMONIA,  in the last 168 hours CBC:  Recent Labs Lab 03/01/13 1248  03/01/13 1313 03/01/13 2155  WBC 8.1  --  7.3  NEUTROABS 5.4  --   --   HGB 14.1 15.0 13.2  HCT 40.5 44.0 37.5*  MCV 91.8  --  91.2  PLT 207  --  216   Cardiac Enzymes:  Recent Labs Lab 03/01/13 2155 03/02/13 0115 03/02/13 0835  TROPONINI <0.30 <0.30 <0.30   BNP:  Recent Labs Lab 03/02/13 0115  PROBNP 82.6   D-Dimer: No results found for this basename: DDIMER,  in the last 168 hours CBG:  Recent Labs Lab 03/01/13 1205 03/01/13 2049 03/02/13 0735  GLUCAP 94 139* 144*   Hemoglobin A1C: No results found for this basename: HGBA1C,  in the last 168 hours Fasting Lipid Panel: No results found for this basename: CHOL, HDL, LDLCALC, TRIG, CHOLHDL, LDLDIRECT,  in the last 168 hours Thyroid Function Tests: No results found for this basename: TSH, T4TOTAL, FREET4, T3FREE, THYROIDAB,  in the last 168 hours Coagulation: No results found for this basename: LABPROT, INR,  in the last 168 hours Anemia Panel: No results found for this basename: VITAMINB12, FOLATE, FERRITIN, TIBC, IRON, RETICCTPCT,  in the last 168 hours Urine Drug Screen: Drugs of Abuse     Component Value Date/Time   LABOPIA NONE DETECTED 06/01/2010 1845   LABOPIA NEGATIVE 02/27/2010 1250   COCAINSCRNUR NONE DETECTED 06/01/2010 1845   COCAINSCRNUR NEGATIVE 02/27/2010 1250  LABBENZ NONE DETECTED 06/01/2010 1845   LABBENZ NEGATIVE 02/27/2010 1250   AMPHETMU NONE DETECTED 06/01/2010 1845   AMPHETMU NEGATIVE 02/27/2010 1250   THCU NONE DETECTED 06/01/2010 1845   LABBARB  Value: NONE DETECTED        DRUG SCREEN FOR MEDICAL PURPOSES ONLY.  IF CONFIRMATION IS NEEDED FOR ANY PURPOSE, NOTIFY LAB WITHIN 5 DAYS.        LOWEST DETECTABLE LIMITS FOR URINE DRUG SCREEN Drug Class       Cutoff (ng/mL) Amphetamine      1000 Barbiturate      200 Benzodiazepine   200 Tricyclics       300 Opiates          300 Cocaine          300 THC              50 06/01/2010 1845    Alcohol Level: No results found for this basename: ETH,  in  the last 168 hours Urinalysis: No results found for this basename: COLORURINE, APPERANCEUR, LABSPEC, PHURINE, GLUCOSEU, HGBUR, BILIRUBINUR, KETONESUR, PROTEINUR, UROBILINOGEN, NITRITE, LEUKOCYTESUR,  in the last 168 hours  Micro Results: No results found for this or any previous visit (from the past 240 hour(s)). Studies/Results: Dg Chest 2 View  03/01/2013   CLINICAL DATA:  Chest pain.  EXAM: CHEST  2 VIEW  COMPARISON:  CHEST x-ray 08/21/2010.  FINDINGS: Lung volumes are normal. No consolidative airspace disease. No pleural effusions. No pneumothorax. No pulmonary nodule or mass noted. Pulmonary vasculature and the cardiomediastinal silhouette are within normal limits.  IMPRESSION: 1.  No radiographic evidence of acute cardiopulmonary disease.   Electronically Signed   By: Trudie Reed M.D.   On: 03/01/2013 11:19   Ct Angio Chest Pe W/cm &/or Wo Cm  03/01/2013   CLINICAL DATA:  Pain, possible pulmonary embolus or aortic injury  EXAM: CT ANGIOGRAPHY CHEST WITH CONTRAST  TECHNIQUE: Multidetector CT imaging of the chest was performed using the standard protocol during bolus administration of intravenous contrast. Multiplanar CT image reconstructions including MIPs were obtained to evaluate the vascular anatomy.  CONTRAST:  75mL OMNIPAQUE IOHEXOL 350 MG/ML SOLN  COMPARISON:  None.  FINDINGS: Sagittal images of the spine shows mild degenerative changes thoracic spine. Sagittal view of the sternum is unremarkable.  Images of the thoracic inlet are unremarkable. Central airways are patent.  There is no mediastinal hematoma or adenopathy.  No hilar adenopathy.  Heart size within normal limits.  No pericardial effusion.  There is no aortic aneurysm. Ascending aorta measures 3 cm in diameter. Descending aorta measures 3 cm in diameter. No aortic dissection. Mild atherosclerotic calcifications of coronary arteries are noted.  The study is of excellent technical quality. No pulmonary embolus is noted.  Bilateral perihilar mild peribronchial thickening. Mild bronchitic changes cannot be excluded.  Images of the lung parenchyma shows no acute infiltrate or pleural effusion. No pulmonary edema. Minimal atelectasis noted lung bases posteriorly. No pulmonary nodules are noted.  Visualized upper abdomen is unremarkable. No adrenal gland mass is noted.  Review of the MIP images confirms the above findings.  IMPRESSION: 1. No pulmonary embolus is noted. No mediastinal hematoma or adenopathy. 2. No aortic aneurysm or dissection. 3. Mild atherosclerotic calcifications of coronary arteries. 4. No acute infiltrate or pulmonary edema. Mild perihilar peribronchial thickening. Mild bronchitic changes cannot be excluded.   Electronically Signed   By: Natasha Mead M.D.   On: 03/01/2013 14:30   Medications: I have reviewed the  patient's current medications. Scheduled Meds: . amLODipine  10 mg Oral Daily  . aspirin EC  325 mg Oral Daily  . benzonatate  200 mg Oral BID  . chlorpheniramine-HYDROcodone  5 mL Oral Q12H  . cloNIDine  0.2 mg Oral BID  . enoxaparin (LOVENOX) injection  40 mg Subcutaneous Q24H  . ibuprofen  800 mg Oral TID  . ibuprofen  800 mg Oral Once  . insulin aspart  0-15 Units Subcutaneous TID WC  . metoprolol  150 mg Oral BID  . pantoprazole  40 mg Oral Daily  . pravastatin  80 mg Oral q1800   Continuous Infusions:  PRN Meds:.acetaminophen, gi cocktail, ibuprofen, nitroGLYCERIN, ondansetron (ZOFRAN) IV Assessment/Plan: Principal Problem:   Pleuritic chest pain Active Problems:   Tobacco abuse   Hypertension   Hyperlipidemia   History of CVA (cerebrovascular accident)   Diabetes mellitus type 2, controlled   GERD (gastroesophageal reflux disease)  # Pleuritic chest pain-Pt has substernal CP radiating to the back that began this morning associated with cough and taking a deep breath. He denies any associated SOB, N/V, diaphoresis, or pain with exertion. He does have a h/o DM2, HTN, CVA, and  hyperlipidemia which puts him at risk for ACS. TIMI score: 3, which indicates a 13% risk at 14 days of: all-cause mortality, new or recurrent MI, or severe recurrent ischemia requiring urgent revascularization. A CXR revealed no acute cardiopulmonary disease. Wells Score: 0. A CTA revealed no PE, no aortic aneurysm or dissection. Differential includes: ACS, PE, Pneumothorax, Aortic Dissection, GERD, Pneumonia (pt is afebrile with nl VS), Costcochondritis. Most likely etiology is viral URI. It is not likely cardiac given non-exertional, non-radiating, and pleuritic in nature. Troponins X3 were negative. BNP was 82 and no acute EKG changes this am. Lipid Panel, CBC, CMP are pending. He was given a GI cocktail for GERD and NTG for CP. We started him on ibuprofen 800mg  tid for pleuritic chest pain. Today, he has no further episodes of CP and will be discharged.  #Cough-Most likely viral etiology given recent sick contact. We gave him tessalon 200mg  bid and tussionex for cough. He reports his cough has improved. He will be sent home with a prescription for tessalon perles 200mg  bid for cough.  #Tobacco abuse-Pt reports he quit smoking 2 days ago.  #Hypertension-BP upon admission 146/91. We continued home meds.  #Hyperlipidemia-Last LP 04/25/12. Pt is on pravastatin 20mg  at home. We increased his pravastatin to  Lab Results   Component  Value  Date    CHOL  130  04/25/2012    HDL  26*  04/25/2012    LDLCALC  31  04/25/2012    TRIG  363*  04/25/2012    CHOLHDL  5.0  04/25/2012    #History of CVA (cerebrovascular accident)-Stable.  #Diabetes mellitus type 2, controlled-Pt on metformin 1000mg  daily. Last HA1C: 01/22/13. We d/c metformin while inpatient and started him on a moderate SSI.  Lab Results   Component  Value  Date    HGBA1C  5.5  01/22/2013    #GERD (gastroesophageal reflux disease)-Pt c/o symptoms. Not currently on PPI at home. He was given a GI cocktail for pain and started on protonix 40mg .  Consider starting on a PPI as an outpatient if dyspepsia/GERD symptoms persist.   Dispo: Patient has no further episodes of chest pain and cough has improved. He will be discharged today.   The patient does have a current PCP Darden Palmer, MD) and does need an [facility]  hospital follow-up appointment after discharge.   .Services Needed at time of discharge: Y = Yes, Blank = No PT:   OT:   RN:   Equipment:   Other:     LOS: 1 day   Boykin Peek, MD 03/02/2013, 10:10 AM

## 2013-03-05 ENCOUNTER — Telehealth: Payer: Self-pay

## 2013-03-05 ENCOUNTER — Telehealth: Payer: Self-pay | Admitting: *Deleted

## 2013-03-05 NOTE — Telephone Encounter (Signed)
Returned pt's wife call - stated pt was in the hospital for chest pain and was prescribed a medication (she could not tell me the name). Stated they are unable to afford it - [facility] cost is $35.00 and [facility] $10.00. And she had talked to ?Deanna Artis at [facility] who told her to call his dr's office. I told her [facility] will probably be your best offer and we do not have medication at the clinic. She became upset; I suggested calling back tomorrow and talk to [place] - she agreed.

## 2013-03-06 NOTE — Addendum Note (Signed)
Addended by: Neomia Dear on: 03/06/2013 10:49 AM   Modules accepted: Orders

## 2013-03-07 ENCOUNTER — Telehealth: Payer: Self-pay | Admitting: Licensed Clinical Social Worker

## 2013-03-07 ENCOUNTER — Encounter: Payer: Self-pay | Admitting: Licensed Clinical Social Worker

## 2013-03-07 NOTE — Telephone Encounter (Signed)
CSW signed pt up with Medicaid medical transportation through [place], ph# [phone].  Letter mailed notifying patient.  Pt now registered for medical transportation.

## 2013-03-07 NOTE — Telephone Encounter (Signed)
CSW received note regarding pt's inability to afford medication.  CSW placed call to Mr. Dooling to obtain name of medication.  Pt requesting CSW to return call at a later time to speak with spouse.  Spouse returned call and left message with CSW stating medication has a $10 copayment and they are unable to afford the medication at this time.  Pt is insured and would be ineligible for additional programs.  CSW returned call left message stating $10 copayment would be the least expensive notify pt if utilizing insurance can request pharmacy to accept payment at a later time if prescription has already been filled.  However, CSW unable to assist with cost of medication.

## 2013-03-11 ENCOUNTER — Other Ambulatory Visit: Payer: Self-pay | Admitting: Internal Medicine

## 2013-03-12 ENCOUNTER — Other Ambulatory Visit: Payer: Self-pay | Admitting: *Deleted

## 2013-03-12 NOTE — Telephone Encounter (Signed)
Please refill at [facility] for 90 days

## 2013-03-12 NOTE — Telephone Encounter (Signed)
Pt called and would like meds filled at [facility].   The med he could not afford was cough med, that was awhile ago.

## 2013-03-12 NOTE — Telephone Encounter (Signed)
Rx filled

## 2013-03-12 NOTE — Telephone Encounter (Signed)
There is a message from social work about patient being unable to afford medications. Is amlodipine one of them? If so, [facility] has the med for $4, we can switch it to there if the patient wishes if they are willing to pay $5 for year membership as well.   Please let me know which medications are the ones hard to afford.

## 2013-03-21 ENCOUNTER — Emergency Department (HOSPITAL_COMMUNITY)
Admission: EM | Admit: 2013-03-21 | Discharge: 2013-03-21 | Disposition: A | Discharge status: Nursing Home (Includes ICF) | Payer: Medicaid Other | Source: Home / Self Care | Attending: Family Medicine | Admitting: Family Medicine

## 2013-03-21 ENCOUNTER — Encounter (HOSPITAL_COMMUNITY): Payer: Self-pay | Admitting: Emergency Medicine

## 2013-03-21 ENCOUNTER — Emergency Department (HOSPITAL_COMMUNITY)
Admission: EM | Admit: 2013-03-21 | Discharge: 2013-03-21 | Disposition: A | Discharge status: Home / Self Care | Payer: Medicaid Other | Attending: Emergency Medicine | Admitting: Emergency Medicine

## 2013-03-21 DIAGNOSIS — Z87891 Personal history of nicotine dependence: Secondary | ICD-10-CM | POA: Insufficient documentation

## 2013-03-21 DIAGNOSIS — R609 Edema, unspecified: Secondary | ICD-10-CM | POA: Insufficient documentation

## 2013-03-21 DIAGNOSIS — M2550 Pain in unspecified joint: Secondary | ICD-10-CM

## 2013-03-21 DIAGNOSIS — M7989 Other specified soft tissue disorders: Secondary | ICD-10-CM

## 2013-03-21 DIAGNOSIS — Z8659 Personal history of other mental and behavioral disorders: Secondary | ICD-10-CM | POA: Insufficient documentation

## 2013-03-21 DIAGNOSIS — Z7982 Long term (current) use of aspirin: Secondary | ICD-10-CM | POA: Insufficient documentation

## 2013-03-21 DIAGNOSIS — E119 Type 2 diabetes mellitus without complications: Secondary | ICD-10-CM | POA: Insufficient documentation

## 2013-03-21 DIAGNOSIS — E785 Hyperlipidemia, unspecified: Secondary | ICD-10-CM | POA: Insufficient documentation

## 2013-03-21 DIAGNOSIS — Z79899 Other long term (current) drug therapy: Secondary | ICD-10-CM | POA: Insufficient documentation

## 2013-03-21 DIAGNOSIS — R600 Localized edema: Secondary | ICD-10-CM

## 2013-03-21 DIAGNOSIS — Z8673 Personal history of transient ischemic attack (TIA), and cerebral infarction without residual deficits: Secondary | ICD-10-CM | POA: Insufficient documentation

## 2013-03-21 DIAGNOSIS — I10 Essential (primary) hypertension: Secondary | ICD-10-CM | POA: Insufficient documentation

## 2013-03-21 DIAGNOSIS — M79609 Pain in unspecified limb: Secondary | ICD-10-CM

## 2013-03-21 LAB — POCT I-STAT, CHEM 8
BUN: 8 mg/dL (ref 6–23)
Calcium, Ion: 1.2 mmol/L (ref 1.12–1.23)
Chloride: 104 mEq/L (ref 96–112)
Creatinine, Ser: 0.9 mg/dL (ref 0.50–1.35)
Glucose, Bld: 124 mg/dL — ABNORMAL HIGH (ref 70–99)
HCT: 44 % (ref 39.0–52.0)
Potassium: 3.8 mEq/L (ref 3.5–5.1)
TCO2: 27 mmol/L (ref 0–100)

## 2013-03-21 LAB — CBC
HCT: 38.5 % — ABNORMAL LOW (ref 39.0–52.0)
Hemoglobin: 13.8 g/dL (ref 13.0–17.0)
MCV: 91.4 fL (ref 78.0–100.0)
RDW: 13.2 % (ref 11.5–15.5)
WBC: 7.4 10*3/uL (ref 4.0–10.5)

## 2013-03-21 LAB — PRO B NATRIURETIC PEPTIDE: Pro B Natriuretic peptide (BNP): 55.1 pg/mL (ref 0–125)

## 2013-03-21 NOTE — ED Provider Notes (Signed)
CSN: [id number]     Arrival date & time 03/21/13  1458 History   First MD Initiated Contact with Patient 03/21/13 1531     Chief Complaint  Patient presents with  . Leg Swelling   (Consider location/radiation/quality/duration/timing/severity/associated sxs/prior Treatment) Patient is a 52 y.o. male presenting with leg pain. The history is provided by the patient. No language interpreter was used.  Leg Pain Location:  Leg Injury: no   Pain details:    Quality:  Aching   Radiates to:  Does not radiate   Severity:  No pain   Timing:  Constant   Progression:  Worsening Relieved by:  Nothing Worsened by:  Nothing tried Ineffective treatments:  None tried Pt complains of pain and swelling to both feet.  Pt complains of pain in right calf,   Pt has had a cva in the past with weakness to right leg  Past Medical History  Diagnosis Date  . Tobacco abuse   . Hypertension   . Hyperlipidemia   . History of CVA (cerebrovascular accident) 02/2010    Left periventricular subcortical ischemic infarction // Residual RUE and RLE weakness  . Internal carotid artery stenosis     BL and mild per CT angiogram (05/2010)  . History of depression     surrounding original stroke - has since resolved (04/2012)  . Prediabetes   . Diabetes mellitus without complication    History reviewed. No pertinent past surgical history. Family History  Problem Relation Age of Onset  . Hypertension Brother   . Diabetes Mother   . Heart failure Mother   . Hypertension Mother   . Hyperlipidemia Mother   . Lung cancer Father   . Diabetes Sister   . Arthritis Sister    History  Substance Use Topics  . Smoking status: Former Smoker -- 0.30 packs/day for 31 years    Types: Cigarettes    Start date: 04/25/1981  . Smokeless tobacco: Not on file     Comment: up to 3 cigs/day; cutting back.  . Alcohol Use: Yes     Comment: occasional    Review of Systems  Musculoskeletal: Positive for myalgias.  All other  systems reviewed and are negative.    Allergies  Benicar; Ace inhibitors; and Angiotensin receptor blockers  Home Medications   Current Outpatient Rx  Name  Route  Sig  Dispense  Refill  . amLODipine (NORVASC) 10 MG tablet   Oral   Take 10 mg by mouth daily.         Marland Kitchen aspirin 81 MG tablet   Oral   Take 81 mg by mouth daily.         . benzonatate (TESSALON) 100 MG capsule   Oral   Take 2 capsules (200 mg total) by mouth 2 (two) times daily as needed for cough.   42 capsule   0   . cloNIDine (CATAPRES) 0.2 MG tablet   Oral   Take 0.2 mg by mouth 2 (two) times daily.         . diphenhydrAMINE (BENADRYL) 25 mg capsule   Oral   Take 1 capsule (25 mg total) by mouth at bedtime as needed for allergies or sleep.   24 capsule   1   . metFORMIN (GLUCOPHAGE) 1000 MG tablet   Oral   Take 0.5 tablets (500 mg total) by mouth 2 (two) times daily with a meal.   180 tablet   5   . metoprolol (LOPRESSOR) 100 MG tablet  Oral   Take 150 mg by mouth 2 (two) times daily.         . pravastatin (PRAVACHOL) 20 MG tablet   Oral   Take 20 mg by mouth daily.         Marland Kitchen terbinafine (LAMISIL) 250 MG tablet   Oral   Take 250 mg by mouth daily.         Marland Kitchen amLODipine (NORVASC) 10 MG tablet      TAKE 1 TABLET BY MOUTH DAILY.   90 tablet   5     **Patient requests 90 days supply**   . pravastatin (PRAVACHOL) 20 MG tablet      TAKE 1 TABLET BY MOUTH DAILY.   90 tablet   5    BP 166/98  Pulse 75  Temp(Src) 97.3 F (36.3 C) (Oral)  Resp 16  SpO2 100% Physical Exam  Nursing note and vitals reviewed. Constitutional: He is oriented to person, place, and time. He appears well-developed and well-nourished.  HENT:  Head: Normocephalic.  Cardiovascular: Normal rate.   Pulmonary/Chest: Effort normal.  Musculoskeletal: He exhibits tenderness.  Positive homan's right  Neurological: He is alert and oriented to person, place, and time. He has normal reflexes.  Skin: Skin  is warm.  Psychiatric: He has a normal mood and affect.    ED Course  Procedures (including critical care time) Labs Review Labs Reviewed - No data to display Imaging Review No results found.  EKG Interpretation     Ventricular Rate:    PR Interval:    QRS Duration:   QT Interval:    QTC Calculation:   R Axis:     Text Interpretation:              MDM   1. Right leg swelling   Pt is known vasculopath with carotid and cardiac disease as well as a previous cva.  I am concerned about dvt.   Pt to Ed for evaluation for doppler study    Elson Areas, PA-C 03/21/13 1558

## 2013-03-21 NOTE — ED Notes (Signed)
C/o bil.  foot and ankle swelling onset last night.  C/o pain across toes of both feet.  No known injury. No hx. of gout.

## 2013-03-21 NOTE — Progress Notes (Signed)
VASCULAR LAB PRELIMINARY  PRELIMINARY  PRELIMINARY  PRELIMINARY  Bilateral lower extremity venous duplex completed.    Preliminary report:  Negative for deep and superficial vein thrombosis bilaterally.  Ilaisaane Marts, RVT 03/21/2013, 6:19 PM

## 2013-03-21 NOTE — ED Provider Notes (Signed)
CSN: [id number]     Arrival date & time 03/21/13  1605 History   First MD Initiated Contact with Patient 03/21/13 1802     Chief Complaint  Patient presents with  . Leg Swelling    HPI Pt was seen at 1850. Per pt and his family, c/o gradual onset and persistence of constant bilat feet and ankles "swelling" since last night. Pt denies injury, no CP/palpitations, no SOB/cough, no fevers, no rash, no LBP, no focal motor weakness, no tingling/numbness in extremities, no joints pain.    Past Medical History  Diagnosis Date  . Tobacco abuse   . Hypertension   . Hyperlipidemia   . History of CVA (cerebrovascular accident) 02/2010    Left periventricular subcortical ischemic infarction // Residual RUE and RLE weakness  . Internal carotid artery stenosis     BL and mild per CT angiogram (05/2010)  . History of depression     surrounding original stroke - has since resolved (04/2012)  . Prediabetes   . Diabetes mellitus without complication    History reviewed. No pertinent past surgical history.  Family History  Problem Relation Age of Onset  . Hypertension Brother   . Diabetes Mother   . Heart failure Mother   . Hypertension Mother   . Hyperlipidemia Mother   . Lung cancer Father   . Diabetes Sister   . Arthritis Sister    History  Substance Use Topics  . Smoking status: Former Smoker -- 0.30 packs/day for 31 years    Types: Cigarettes    Start date: 04/25/1981  . Smokeless tobacco: Not on file     Comment: up to 3 cigs/day; cutting back.  . Alcohol Use: Yes     Comment: occasional    Review of Systems ROS: Statement: All systems negative except as marked or noted in the HPI; Constitutional: Negative for fever and chills. ; ; Eyes: Negative for eye pain, redness and discharge. ; ; ENMT: Negative for ear pain, hoarseness, nasal congestion, sinus pressure and sore throat. ; ; Cardiovascular: Negative for chest pain, palpitations, diaphoresis, dyspnea and +peripheral edema. ;  ; Respiratory: Negative for cough, wheezing and stridor. ; ; Gastrointestinal: Negative for nausea, vomiting, diarrhea, abdominal pain, blood in stool, hematemesis, jaundice and rectal bleeding. . ; ; Genitourinary: Negative for dysuria, flank pain and hematuria. ; ; Musculoskeletal: Negative for back pain and neck pain. Negative for deformity and trauma.; ; Skin: Negative for pruritus, rash, abrasions, blisters, bruising and skin lesion.; ; Neuro: Negative for headache, lightheadedness and neck stiffness. Negative for weakness, altered level of consciousness , altered mental status, extremity weakness, paresthesias, involuntary movement, seizure and syncope.       Allergies  Benicar; Ace inhibitors; and Angiotensin receptor blockers  Home Medications   Current Outpatient Rx  Name  Route  Sig  Dispense  Refill  . amLODipine (NORVASC) 10 MG tablet   Oral   Take 10 mg by mouth daily.         Marland Kitchen aspirin 81 MG tablet   Oral   Take 81 mg by mouth daily.         . benzonatate (TESSALON) 100 MG capsule   Oral   Take 2 capsules (200 mg total) by mouth 2 (two) times daily as needed for cough.   42 capsule   0   . cloNIDine (CATAPRES) 0.2 MG tablet   Oral   Take 0.2 mg by mouth 2 (two) times daily.         Marland Kitchen  diphenhydrAMINE (BENADRYL) 25 mg capsule   Oral   Take 1 capsule (25 mg total) by mouth at bedtime as needed for allergies or sleep.   24 capsule   1   . metFORMIN (GLUCOPHAGE) 1000 MG tablet   Oral   Take 0.5 tablets (500 mg total) by mouth 2 (two) times daily with a meal.   180 tablet   5   . metoprolol (LOPRESSOR) 100 MG tablet   Oral   Take 150 mg by mouth 2 (two) times daily.         . pravastatin (PRAVACHOL) 20 MG tablet   Oral   Take 20 mg by mouth daily.         Marland Kitchen terbinafine (LAMISIL) 250 MG tablet   Oral   Take 250 mg by mouth daily.          BP 154/97  Pulse 69  Temp(Src) 97.7 F (36.5 C) (Oral)  Resp 18  SpO2 100% Physical  Exam 1855: Physical examination:  Nursing notes reviewed; Vital signs and O2 SAT reviewed;  Constitutional: Well developed, Well nourished, Well hydrated, In no acute distress; Head:  Normocephalic, atraumatic; Eyes: EOMI, PERRL, No scleral icterus; ENMT: Mouth and pharynx normal, Mucous membranes moist; Neck: Supple, Full range of motion, No lymphadenopathy; Cardiovascular: Regular rate and rhythm, No gallop; Respiratory: Breath sounds clear & equal bilaterally, No wheezes.  Speaking full sentences with ease, Normal respiratory effort/excursion; Chest: Nontender, Movement normal; Abdomen: Soft, Nontender, Nondistended, Normal bowel sounds; Genitourinary: No CVA tenderness; Extremities: Pulses normal, No tenderness, +trace pedal edema bilat feet to ankles. No calf edema or asymmetry.; Neuro: AA&Ox3, Major CN grossly intact.  Speech clear. Moves extremities on stretcher without apparent gross focal motor deficits from baseline right sided weakness. Climbs on and off stretcher easily by himself. Gait steady with his cane per baseline.; Skin: Color normal, Warm, Dry.   ED Course  Procedures   EKG Interpretation     Ventricular Rate:  71 PR Interval:  190 QRS Duration: 94 QT Interval:  388 QTC Calculation: 421 R Axis:   2 Text Interpretation:  Normal sinus rhythm Septal infarct , age undetermined T wave abnormality Inferior leads No significant change since last tracing dated 03/02/2013            MDM  MDM Reviewed: previous chart, nursing note and vitals Reviewed previous: ECG and labs Interpretation: ultrasound, labs and ECG     Results for orders placed during the hospital encounter of 03/21/13  PRO B NATRIURETIC PEPTIDE      Result Value Range   Pro B Natriuretic peptide (BNP) 55.1  0 - 125 pg/mL  CBC      Result Value Range   WBC 7.4  4.0 - 10.5 K/uL   RBC 4.21 (*) 4.22 - 5.81 MIL/uL   Hemoglobin 13.8  13.0 - 17.0 g/dL   HCT 16.1 (*) 09.6 - 04.5 %   MCV 91.4  78.0 -  100.0 fL   MCH 32.8  26.0 - 34.0 pg   MCHC 35.8  30.0 - 36.0 g/dL   RDW 40.9  81.1 - 91.4 %   Platelets 260  150 - 400 K/uL  POCT I-STAT, CHEM 8      Result Value Range   Sodium 144  135 - 145 mEq/L   Potassium 3.8  3.5 - 5.1 mEq/L   Chloride 104  96 - 112 mEq/L   BUN 8  6 - 23 mg/dL   Creatinine, Ser 7.82  0.50 - 1.35 mg/dL   Glucose, Bld 086 (*) 70 - 99 mg/dL   Calcium, Ion 5.78  1.12 - 1.23 mmol/L   TCO2 27  0 - 100 mmol/L   Hemoglobin 15.0  13.0 - 17.0 g/dL   HCT 46.9  62.9 - 52.8 %   Dg Chest 2 View 03/01/2013   CLINICAL DATA:  Chest pain.  EXAM: CHEST  2 VIEW  COMPARISON:  CHEST x-ray 08/21/2010.  FINDINGS: Lung volumes are normal. No consolidative airspace disease. No pleural effusions. No pneumothorax. No pulmonary nodule or mass noted. Pulmonary vasculature and the cardiomediastinal silhouette are within normal limits.  IMPRESSION: 1.  No radiographic evidence of acute cardiopulmonary disease.   Electronically Signed   By: Trudie Reed M.D.   On: 03/01/2013 11:19   Ct Angio Chest Pe W/cm &/or Wo Cm 03/01/2013   CLINICAL DATA:  Pain, possible pulmonary embolus or aortic injury  EXAM: CT ANGIOGRAPHY CHEST WITH CONTRAST  TECHNIQUE: Multidetector CT imaging of the chest was performed using the standard protocol during bolus administration of intravenous contrast. Multiplanar CT image reconstructions including MIPs were obtained to evaluate the vascular anatomy.  CONTRAST:  75mL OMNIPAQUE IOHEXOL 350 MG/ML SOLN  COMPARISON:  None.  FINDINGS: Sagittal images of the spine shows mild degenerative changes thoracic spine. Sagittal view of the sternum is unremarkable.  Images of the thoracic inlet are unremarkable. Central airways are patent.  There is no mediastinal hematoma or adenopathy.  No hilar adenopathy.  Heart size within normal limits.  No pericardial effusion.  There is no aortic aneurysm. Ascending aorta measures 3 cm in diameter. Descending aorta measures 3 cm in diameter. No  aortic dissection. Mild atherosclerotic calcifications of coronary arteries are noted.  The study is of excellent technical quality. No pulmonary embolus is noted. Bilateral perihilar mild peribronchial thickening. Mild bronchitic changes cannot be excluded.  Images of the lung parenchyma shows no acute infiltrate or pleural effusion. No pulmonary edema. Minimal atelectasis noted lung bases posteriorly. No pulmonary nodules are noted.  Visualized upper abdomen is unremarkable. No adrenal gland mass is noted.  Review of the MIP images confirms the above findings.  IMPRESSION: 1. No pulmonary embolus is noted. No mediastinal hematoma or adenopathy. 2. No aortic aneurysm or dissection. 3. Mild atherosclerotic calcifications of coronary arteries. 4. No acute infiltrate or pulmonary edema. Mild perihilar peribronchial thickening. Mild bronchitic changes cannot be excluded.   Electronically Signed   By: Natasha Mead M.D.   On: 03/01/2013 14:30    VASCULAR LAB  PRELIMINARY PRELIMINARY PRELIMINARY PRELIMINARY  Bilateral lower extremity venous duplex completed.  Preliminary report: Negative for deep and superficial vein thrombosis bilaterally.  NICHOLS, FRANCES, RVT  03/21/2013, 6:19 PM   1915:  Pt denies CP/SOB. Denies LE's injury. Pt ambulatory around the ED steadily with his cane per his baseline gait. Bilat LE's vasc US negative for DVT. No elevation of BNP. Previous CXR and CT-A chest 2 weeks ago without evidence of CHF.  Previous Echo dated 02/2010 with normal EF. Pt and family want to go home now. Dx and testing d/w pt and family.  Questions answered.  Verb understanding, agreeable to d/c home with outpt f/u.      Laray Anger, DO 03/24/13 [phone]

## 2013-03-21 NOTE — ED Notes (Signed)
Pt sent here from [facility] for further eval of bilateral leg swelling since last night. No resp distress, no cp. No acute distress noted at triage.

## 2013-03-21 NOTE — ED Notes (Signed)
Pt not in the room still.Vacular room not answering the phone.

## 2013-03-23 NOTE — ED Provider Notes (Signed)
Medical screening examination/treatment/procedure(s) were performed by a resident physician or non-physician practitioner and as the supervising physician I was immediately available for consultation/collaboration.  Mikey Maffett, MD    Danilynn Jemison S Janeece Blok, MD 03/23/13 0910 

## 2013-05-20 NOTE — Addendum Note (Signed)
Addended by: Hulan Fray on: 05/20/2013 06:06 PM   Modules accepted: Orders

## 2013-12-13 ENCOUNTER — Ambulatory Visit: Payer: Medicaid Other | Admitting: Internal Medicine

## 2013-12-20 ENCOUNTER — Encounter: Payer: Self-pay | Admitting: Internal Medicine

## 2013-12-20 ENCOUNTER — Telehealth: Payer: Self-pay | Admitting: Internal Medicine

## 2013-12-20 NOTE — Telephone Encounter (Signed)
Attempted to contact patient this afternoon to get him scheduled for his diabetes.  The phone number we have on file [phone] is disconnected.  Going to send him a letter asking him to contact us for an appt and to update his telephone number.

## 2014-01-17 ENCOUNTER — Other Ambulatory Visit: Payer: Self-pay | Admitting: Internal Medicine

## 2014-01-17 NOTE — Telephone Encounter (Signed)
This does not appear to have been filled for at least a year?? And I have not seen him in opc for a year as well. Is he still taking these medications??

## 2014-01-18 NOTE — Telephone Encounter (Signed)
Hi Ejiro,  You are scheduled to see this guy in opc next week, if he shows up can you please review his medications and refill if still taking and needing? I have not seen him for >1 year and not sure where he has been getting his medications. I did send a message to triage as well but no response yet so I figured you would probably see him first if he does not "no-show".   Thanks!  Cloie Wooden

## 2014-01-20 NOTE — Telephone Encounter (Signed)
He is scheduled to see dr Denton Brick on the 16th, perhaps it can be verified then if not done sooner?

## 2014-01-22 ENCOUNTER — Ambulatory Visit: Payer: Medicaid Other | Admitting: Internal Medicine

## 2014-01-24 NOTE — Telephone Encounter (Signed)
Pt's family has been informed and will schedule appointment

## 2014-01-24 NOTE — Telephone Encounter (Signed)
Another request for refill of Metoprolol.  Please address

## 2014-01-24 NOTE — Telephone Encounter (Signed)
Pt's wife called back and she states he is still taking this BP med. She will call and reschedule appointment. I told her he must be seen in 30 days. Will you refill Metoprolol?

## 2014-01-24 NOTE — Telephone Encounter (Signed)
I refilled the prescription but he needs to f/u next week. We cannot continue his meds without follow up and reassessment as to whether he needs this medication or not

## 2014-01-24 NOTE — Telephone Encounter (Signed)
Pharmacy called and pt last refilled Metoprolol on 7/20 for a 45 day supply - so he must still be taking it.  I tried calling pt and number is not working, I called a contact # and was given this number.  # [phone] Number called and no answer but was able to leave message.  Pt did not keep appointment on 9/17

## 2014-02-11 ENCOUNTER — Encounter: Payer: Self-pay | Admitting: Internal Medicine

## 2014-02-13 ENCOUNTER — Other Ambulatory Visit: Payer: Self-pay | Admitting: Internal Medicine

## 2014-02-20 ENCOUNTER — Ambulatory Visit: Payer: Medicaid Other | Admitting: Internal Medicine

## 2014-02-25 ENCOUNTER — Ambulatory Visit (INDEPENDENT_AMBULATORY_CARE_PROVIDER_SITE_OTHER): Payer: Medicaid Other | Admitting: Internal Medicine

## 2014-02-25 ENCOUNTER — Encounter: Payer: Self-pay | Admitting: Internal Medicine

## 2014-02-25 VITALS — BP 144/87 | HR 60 | Temp 98.0°F | Ht 74.0 in | Wt 215.7 lb

## 2014-02-25 DIAGNOSIS — M792 Neuralgia and neuritis, unspecified: Secondary | ICD-10-CM | POA: Insufficient documentation

## 2014-02-25 DIAGNOSIS — G5791 Unspecified mononeuropathy of right lower limb: Secondary | ICD-10-CM

## 2014-02-25 DIAGNOSIS — Z0000 Encounter for general adult medical examination without abnormal findings: Secondary | ICD-10-CM

## 2014-02-25 DIAGNOSIS — E119 Type 2 diabetes mellitus without complications: Secondary | ICD-10-CM

## 2014-02-25 DIAGNOSIS — Z720 Tobacco use: Secondary | ICD-10-CM

## 2014-02-25 DIAGNOSIS — Z23 Encounter for immunization: Secondary | ICD-10-CM

## 2014-02-25 DIAGNOSIS — E785 Hyperlipidemia, unspecified: Secondary | ICD-10-CM

## 2014-02-25 DIAGNOSIS — I10 Essential (primary) hypertension: Secondary | ICD-10-CM

## 2014-02-25 DIAGNOSIS — R634 Abnormal weight loss: Secondary | ICD-10-CM

## 2014-02-25 LAB — LIPID PANEL
CHOL/HDL RATIO: 4 ratio
CHOLESTEROL: 190 mg/dL (ref 0–200)
HDL: 47 mg/dL (ref >39)
LDL Cholesterol: 108 mg/dL — ABNORMAL HIGH (ref 0–99)
Triglycerides: 174 mg/dL — ABNORMAL HIGH (ref <150)
VLDL: 35 mg/dL (ref 0–40)

## 2014-02-25 LAB — HM DIABETES EYE EXAM

## 2014-02-25 LAB — GLUCOSE, CAPILLARY: Glucose-Capillary: 131 mg/dL — ABNORMAL HIGH (ref 70–99)

## 2014-02-25 LAB — POCT GLYCOSYLATED HEMOGLOBIN (HGB A1C): HEMOGLOBIN A1C: 6.1

## 2014-02-25 MED ORDER — AMLODIPINE BESYLATE 10 MG PO TABS: ORAL_TABLET | ORAL | Status: DC

## 2014-02-25 MED ORDER — METOPROLOL TARTRATE 100 MG PO TABS: ORAL_TABLET | ORAL | Status: DC

## 2014-02-25 MED ORDER — PRAVASTATIN SODIUM 20 MG PO TABS: 20.0000 mg | ORAL_TABLET | Freq: Every day | ORAL | Status: DC

## 2014-02-25 MED ORDER — NICOTINE 7 MG/24HR TD PT24: 7.0000 mg | MEDICATED_PATCH | Freq: Every day | TRANSDERMAL | Status: DC

## 2014-02-25 MED ORDER — METFORMIN HCL 500 MG PO TABS: 500.0000 mg | ORAL_TABLET | Freq: Two times a day (BID) | ORAL | Status: DC

## 2014-02-25 MED ORDER — GABAPENTIN 300 MG PO CAPS: 300.0000 mg | ORAL_CAPSULE | Freq: Three times a day (TID) | ORAL | Status: DC

## 2014-02-25 MED ORDER — CLONIDINE HCL 0.2 MG PO TABS: 0.2000 mg | ORAL_TABLET | Freq: Two times a day (BID) | ORAL | Status: DC

## 2014-02-25 NOTE — Assessment & Plan Note (Signed)
BP Readings from Last 3 Encounters:  02/25/14 144/87  03/21/13 140/86  03/21/13 166/98   Lab Results  Component Value Date   NA 144 03/21/2013   K 3.8 03/21/2013   CREATININE 0.90 03/21/2013   Assessment: Blood pressure control: mildly elevated Progress toward BP goal:    Comments: likely in setting of pain  Plan: Medications:  continue current medications metoprolol, clonidine, and norvasc Educational resources provided:   Self management tools provided:   Other plans: 3 month refills provided today

## 2014-02-25 NOTE — Assessment & Plan Note (Signed)
Lipid panel today  -refilled 3 month supply of pravastatin

## 2014-02-25 NOTE — Assessment & Plan Note (Signed)
Eye exam, foot exam, urine microalbumin, flu shot, a1c check, and lipid panel all done today  -will visit colonoscopy screening on future visit

## 2014-02-25 NOTE — Assessment & Plan Note (Addendum)
S/p cva, intermittent.  Success with tramadol in the past but not with NSAIDS. Does have diabetes but is well controlled.   -will favor trial of gabapentin for now instead of going straight to tramadol; dose 300mg  tid x1 month with refills -could also consider PT if still persistent and if he is willing

## 2014-02-25 NOTE — Patient Instructions (Addendum)
General Instructions:  Thank you for bringing your medicines today. This helps [place] keep you safe from mistakes.  Please start gabapentin 300mg  three times a day for pain relief  Consider using the nicotine patches  Flu shot and eye exam done today and all your medications were refilled with 90 day supplies  Progress Toward Treatment Goals:  Treatment Goal 01/22/2013  Hemoglobin A1C at goal  Blood pressure at goal  Stop smoking smoking less    Self Care Goals & Plans:  Self Care Goal 01/22/2013  Manage my medications take my medicines as prescribed  Monitor my health -  Eat healthy foods -  Be physically active -  Stop smoking call [facility] (1-800-QUIT-NOW); set a quit date and stop smoking; cut down the number of cigarettes smoked    Home Blood Glucose Monitoring 01/22/2013  Check my blood sugar 3 times a day  When to check my blood sugar before meals    Care Management & Community Referrals:  Referral 07/03/2012  Referrals made for care management support diabetes educator  Referrals made to community resources nutrition     Gabapentin capsules or tablets What is this medicine? GABAPENTIN (GA ba pen tin) is used to control partial seizures in adults with epilepsy. It is also used to treat certain types of nerve pain. This medicine may be used for other purposes; ask your health care provider or pharmacist if you have questions. COMMON BRAND NAME(S): Orpha Bur, Neurontin What should I tell my health care provider before I take this medicine? They need to know if you have any of these conditions: -kidney disease -suicidal thoughts, plans, or attempt; a previous suicide attempt by you or a family member -an unusual or allergic reaction to gabapentin, other medicines, foods, dyes, or preservatives -pregnant or trying to get pregnant -breast-feeding How should I use this medicine? Take this medicine by mouth with a glass of water. Follow the directions on the prescription  label. You can take it with or without food. If it upsets your stomach, take it with food.Take your medicine at regular intervals. Do not take it more often than directed. Do not stop taking except on your doctor's advice. If you are directed to break the 600 or 800 mg tablets in half as part of your dose, the extra half tablet should be used for the next dose. If you have not used the extra half tablet within 28 days, it should be thrown away. A special MedGuide will be given to you by the pharmacist with each prescription and refill. Be sure to read this information carefully each time. Talk to your pediatrician regarding the use of this medicine in children. Special care may be needed. Overdosage: If you think you have taken too much of this medicine contact a poison control center or emergency room at once. NOTE: This medicine is only for you. Do not share this medicine with others. What if I miss a dose? If you miss a dose, take it as soon as you can. If it is almost time for your next dose, take only that dose. Do not take double or extra doses. What may interact with this medicine? Do not take this medicine with any of the following medications: -other gabapentin products This medicine may also interact with the following medications: -alcohol -antacids -antihistamines for allergy, cough and cold -certain medicines for anxiety or sleep -certain medicines for depression or psychotic disturbances -homatropine; hydrocodone -naproxen -narcotic medicines (opiates) for pain -phenothiazines like chlorpromazine, mesoridazine, prochlorperazine,  thioridazine This list may not describe all possible interactions. Give your health care provider a list of all the medicines, herbs, non-prescription drugs, or dietary supplements you use. Also tell them if you smoke, drink alcohol, or use illegal drugs. Some items may interact with your medicine. What should I watch for while using this medicine? Visit  your doctor or health care professional for regular checks on your progress. You may want to keep a record at home of how you feel your condition is responding to treatment. You may want to share this information with your doctor or health care professional at each visit. You should contact your doctor or health care professional if your seizures get worse or if you have any new types of seizures. Do not stop taking this medicine or any of your seizure medicines unless instructed by your doctor or health care professional. Stopping your medicine suddenly can increase your seizures or their severity. Wear a medical identification bracelet or chain if you are taking this medicine for seizures, and carry a card that lists all your medications. You may get drowsy, dizzy, or have blurred vision. Do not drive, use machinery, or do anything that needs mental alertness until you know how this medicine affects you. To reduce dizzy or fainting spells, do not sit or stand up quickly, especially if you are an older patient. Alcohol can increase drowsiness and dizziness. Avoid alcoholic drinks. Your mouth may get dry. Chewing sugarless gum or sucking hard candy, and drinking plenty of water will help. The use of this medicine may increase the chance of suicidal thoughts or actions. Pay special attention to how you are responding while on this medicine. Any worsening of mood, or thoughts of suicide or dying should be reported to your health care professional right away. Women who become pregnant while using this medicine may enroll in the [place] Pregnancy Registry by calling [phone]. This registry collects information about the safety of antiepileptic drug use during pregnancy. What side effects may I notice from receiving this medicine? Side effects that you should report to your doctor or health care professional as soon as possible: -allergic reactions like skin rash, itching or hives,  swelling of the face, lips, or tongue -worsening of mood, thoughts or actions of suicide or dying Side effects that usually do not require medical attention (report to your doctor or health care professional if they continue or are bothersome): -constipation -difficulty walking or controlling muscle movements -dizziness -nausea -slurred speech -tiredness -tremors -weight gain This list may not describe all possible side effects. Call your doctor for medical advice about side effects. You may report side effects to FDA at 1-800-FDA-1088. Where should I keep my medicine? Keep out of reach of children. Store at room temperature between 15 and 30 degrees C (59 and 86 degrees F). Throw away any unused medicine after the expiration date. NOTE: This sheet is a summary. It may not cover all possible information. If you have questions about this medicine, talk to your doctor, pharmacist, or health care provider.  2015, Elsevier/Gold Standard. (2012-12-27 09:12:48)

## 2014-02-25 NOTE — Assessment & Plan Note (Addendum)
Weight up to 215lb's today from 197lb in September 2014  -continue to monitor

## 2014-02-25 NOTE — Assessment & Plan Note (Signed)
Lab Results  Component Value Date   HGBA1C 6.1 02/25/2014   HGBA1C 5.5 01/22/2013   HGBA1C 8.4 07/03/2012    Assessment: Diabetes control:   well controlled Progress toward A1C goal:    deteriorated Comments: slightly increased from 5.5 to 6.1 today  Plan: Medications:  continue current medications metformin 500mg  bid (changed tablets from 1000mg  to 500mg  tablets for easier use) Home glucose monitoring: Frequency:   Timing:   Instruction/counseling given: reminded to get eye exam, reminded to bring blood glucose meter & log to each visit, reminded to bring medications to each visit and discussed foot care Educational resources provided:   Self management tools provided:   Other plans: eye and foot exam done today along with urine microalbumin:Cr, lipid panel

## 2014-02-25 NOTE — Assessment & Plan Note (Signed)
  Assessment: Progress toward smoking cessation:    Barriers to progress toward smoking cessation:    Comments: down ton ~1 cig/day, wife has quit 3 months which is helpful  Plan: Instruction/counseling given:  I counseled patient on the dangers of tobacco use, advised patient to stop smoking, and reviewed strategies to maximize success. Educational resources provided:    Self management tools provided:    Medications to assist with smoking cessation:  Nicotine Patch Patient agreed to the following self-care plans for smoking cessation:    Other plans: provided print prescription for 7mg  patches if their insurance will cover it he will use it

## 2014-02-25 NOTE — Progress Notes (Signed)
Case discussed with Dr. Qureshi soon after the resident saw the patient.  We reviewed the resident's history and exam and pertinent patient test results.  I agree with the assessment, diagnosis, and plan of care documented in the resident's note. 

## 2014-02-25 NOTE — Progress Notes (Signed)
Subjective:   Patient ID: Ronald Marsh male   DOB: Feb 21, 1961 53 y.o.   [name]: [id number]  HPI: [name] is a 53 y.o. male with with HTN and well controlled DM2 presenting to opc today for routine visit.   HTN--bp slightly elevated today but compliant with medications.  Likely a little elevated in setting of pain on right side.   R side body pain--s/p cva, intermittent.  Success with tramadol in the past but no relief with mobic.  Describes the pain as going down to right leg from the arm, nerve like pain, occasional tingling.  He does have diabetes although well controlled.  Does not recall trying gabapentin in the past.   DM2--well controlled, A1C 6.1 today. Complaint with metformin.   Smoking--down to ~1 cigarette a day, wife has quit x3 months, he is trying. Interested in nicotine patches if he can afford them.   He brought all his medications in today and is requesting refills.   Past Medical History  Diagnosis Date  . Tobacco abuse   . Hypertension   . Hyperlipidemia   . History of CVA (cerebrovascular accident) 02/2010    Left periventricular subcortical ischemic infarction // Residual RUE and RLE weakness  . Internal carotid artery stenosis     BL and mild per CT angiogram (05/2010)  . History of depression     surrounding original stroke - has since resolved (04/2012)  . Prediabetes   . Diabetes mellitus without complication    Current Outpatient Prescriptions  Medication Sig Dispense Refill  . amLODipine (NORVASC) 10 MG tablet TAKE 1 TABLET BY MOUTH DAILY  90 tablet  5  . aspirin 81 MG tablet Take 81 mg by mouth daily.      . cloNIDine (CATAPRES) 0.2 MG tablet Take 1 tablet (0.2 mg total) by mouth 2 (two) times daily.  180 tablet  5  . metoprolol (LOPRESSOR) 100 MG tablet TAKE 1 AND 1/2 TABLETS BY MOUTH TWICE DAILY  135 tablet  5  . pravastatin (PRAVACHOL) 20 MG tablet Take 1 tablet (20 mg total) by mouth daily.  90 tablet  5  . diphenhydrAMINE (BENADRYL)  25 mg capsule Take 1 capsule (25 mg total) by mouth at bedtime as needed for allergies or sleep.  24 capsule  1  . gabapentin (NEURONTIN) 300 MG capsule Take 1 capsule (300 mg total) by mouth 3 (three) times daily.  90 capsule  3  . metFORMIN (GLUCOPHAGE) 500 MG tablet Take 1 tablet (500 mg total) by mouth 2 (two) times daily with a meal.  180 tablet  5  . nicotine (NICODERM CQ - DOSED IN MG/24 HR) 7 mg/24hr patch Place 1 patch (7 mg total) onto the skin daily.  28 patch  0   No current facility-administered medications for this visit.   Family History  Problem Relation Age of Onset  . Hypertension Brother   . Diabetes Mother   . Heart failure Mother   . Hypertension Mother   . Hyperlipidemia Mother   . Lung cancer Father   . Diabetes Sister   . Arthritis Sister   . Glaucoma Mother    History   Social History  . Marital Status: Married    Spouse Name: N/A    Number of Children: 1  . Years of Education: 11th grade   Occupational History  . On disability     previously worked for [employer] until his stroke in 2011.    Social History Main Topics  .  Smoking status: Former Smoker -- 0.30 packs/day for 31 years    Types: Cigarettes    Start date: 04/25/1981  . Smokeless tobacco: None     Comment: up to 3 cigs/day; cutting back.  . Alcohol Use: Yes     Comment: occasional  . Drug Use: No  . Sexual Activity: Yes   Other Topics Concern  . None   Social History Narrative   Lives in [place] with wife.   Has been incarcerated multiple times, last time was in 2012 for 8 months.   Review of Systems:  Constitutional:  Denies fever, chills  HEENT:  Denies congestion  Respiratory:  Denies SOB  Cardiovascular:  Denies chest pain  Gastrointestinal:  Denies nausea, vomiting, abdominal pain   Genitourinary:  Denies dysuria  Musculoskeletal:  Tingling intermittent pain right arm and leg  Skin:  Denies pallor, rash and wound.   Neurological:  Denies headache   Objective:    Physical Exam: Filed Vitals:   02/25/14 0937  BP: 144/87  Pulse: 60  Temp: 98 F (36.7 C)  TempSrc: Oral  Height: 6\' 2"  (1.88 m)  Weight: 215 lb 11.2 oz (97.841 kg)  SpO2: 100%   Vitals reviewed. General: sitting in chair, NAD HEENT: EOMI Cardiac: RRR Pulm: clear to auscultation bilaterally, no wheezes, rales, or rhonchi Abd: soft, nontender, BS present Ext: warm and well perfused, no pedal edema, +2DP B/L (harder to find initially on right foot compared to left) Neuro: alert and oriented X3, strength 3/5 right upper extremity compared to 5/5 LUE, 5/5 b/l lower extremities  Assessment & Plan:  Discussed with Dr. Eppie Gibson Neuropathic pain--started gabapentin today

## 2014-02-26 LAB — MICROALBUMIN / CREATININE URINE RATIO
Creatinine, Urine: 288.4 mg/dL
Microalb Creat Ratio: 12.5 mg/g (ref 0.0–30.0)
Microalb, Ur: 3.6 mg/dL — ABNORMAL HIGH (ref <2.0)

## 2014-03-03 ENCOUNTER — Encounter: Payer: Self-pay | Admitting: *Deleted

## 2014-05-01 ENCOUNTER — Other Ambulatory Visit: Payer: Self-pay | Admitting: Internal Medicine

## 2014-05-05 ENCOUNTER — Other Ambulatory Visit: Payer: Self-pay | Admitting: Internal Medicine

## 2014-05-07 NOTE — Telephone Encounter (Signed)
Per pharm pt has refills

## 2014-09-18 ENCOUNTER — Encounter: Payer: Self-pay | Admitting: *Deleted

## 2015-10-01 ENCOUNTER — Ambulatory Visit (INDEPENDENT_AMBULATORY_CARE_PROVIDER_SITE_OTHER): Payer: Medicaid Other | Admitting: Internal Medicine

## 2015-10-01 VITALS — BP 155/91 | HR 63 | Temp 97.9°F | Ht 74.0 in | Wt 224.1 lb

## 2015-10-01 DIAGNOSIS — I10 Essential (primary) hypertension: Secondary | ICD-10-CM

## 2015-10-01 DIAGNOSIS — Z7289 Other problems related to lifestyle: Secondary | ICD-10-CM | POA: Diagnosis not present

## 2015-10-01 DIAGNOSIS — Z79899 Other long term (current) drug therapy: Secondary | ICD-10-CM | POA: Diagnosis not present

## 2015-10-01 DIAGNOSIS — E119 Type 2 diabetes mellitus without complications: Secondary | ICD-10-CM | POA: Diagnosis not present

## 2015-10-01 DIAGNOSIS — E1142 Type 2 diabetes mellitus with diabetic polyneuropathy: Secondary | ICD-10-CM

## 2015-10-01 DIAGNOSIS — Z0000 Encounter for general adult medical examination without abnormal findings: Secondary | ICD-10-CM

## 2015-10-01 DIAGNOSIS — Z7984 Long term (current) use of oral hypoglycemic drugs: Secondary | ICD-10-CM | POA: Diagnosis not present

## 2015-10-01 DIAGNOSIS — I69351 Hemiplegia and hemiparesis following cerebral infarction affecting right dominant side: Secondary | ICD-10-CM | POA: Diagnosis not present

## 2015-10-01 DIAGNOSIS — Z8673 Personal history of transient ischemic attack (TIA), and cerebral infarction without residual deficits: Secondary | ICD-10-CM

## 2015-10-01 DIAGNOSIS — E785 Hyperlipidemia, unspecified: Secondary | ICD-10-CM

## 2015-10-01 DIAGNOSIS — M5416 Radiculopathy, lumbar region: Secondary | ICD-10-CM | POA: Insufficient documentation

## 2015-10-01 LAB — POCT GLYCOSYLATED HEMOGLOBIN (HGB A1C): Hemoglobin A1C: 7.8

## 2015-10-01 LAB — GLUCOSE, CAPILLARY: GLUCOSE-CAPILLARY: 170 mg/dL — AB (ref 65–99)

## 2015-10-01 MED ORDER — METOPROLOL TARTRATE 100 MG PO TABS: ORAL_TABLET | ORAL | Status: DC

## 2015-10-01 MED ORDER — PRAVASTATIN SODIUM 20 MG PO TABS: 20.0000 mg | ORAL_TABLET | Freq: Every day | ORAL | Status: DC

## 2015-10-01 MED ORDER — METFORMIN HCL 1000 MG PO TABS: 1000.0000 mg | ORAL_TABLET | Freq: Two times a day (BID) | ORAL | Status: DC

## 2015-10-01 MED ORDER — CLONIDINE HCL 0.2 MG PO TABS: 0.2000 mg | ORAL_TABLET | Freq: Two times a day (BID) | ORAL | Status: DC

## 2015-10-01 MED ORDER — GABAPENTIN 300 MG PO CAPS: 600.0000 mg | ORAL_CAPSULE | Freq: Every day | ORAL | Status: DC

## 2015-10-01 MED ORDER — ASPIRIN 81 MG PO TABS: 81.0000 mg | ORAL_TABLET | Freq: Every day | ORAL | Status: DC

## 2015-10-01 MED ORDER — MELOXICAM 15 MG PO TABS: 15.0000 mg | ORAL_TABLET | Freq: Every day | ORAL | Status: DC

## 2015-10-01 MED ORDER — AMLODIPINE BESYLATE 10 MG PO TABS: ORAL_TABLET | ORAL | Status: DC

## 2015-10-01 NOTE — Progress Notes (Signed)
Subjective:   Patient ID: Ronald Marsh male   DOB: [birth year] 55 y.o.   [name]: [id number]  HPI: [name] is a 55 y.o. male w/ PMHx of HTN, HLD, h/o CVA (2011), ICA stenosis, DM type II, and depression, presents to the clinic today for a follow-up visit for HTN. Patient was recently incarcerated, now re-establishing care in the clinic. Has not been seen since 02/2014. Says he is feeling well these days, has right hip pain almost all the time, says this has been present for a long while, ever since he had his stroke. Also states he has low back pain which radiates into his legs. Describes this pain as a burning/dull pain with some intermittent shooting pain. No change in weakness (right sided residual weakness from CVA), no saddle anesthesia, no incontinence. Walks with a cane. Has been taking Metformin and blood pressure pills, in addition to ASA while and prison. Says he still smokes intermittently, but not every day.   Past Medical History  Diagnosis Date  . Tobacco abuse   . Hypertension   . Hyperlipidemia   . History of CVA (cerebrovascular accident) 02/2010    Left periventricular subcortical ischemic infarction // Residual RUE and RLE weakness  . Internal carotid artery stenosis     BL and mild per CT angiogram (05/2010)  . History of depression     surrounding original stroke - has since resolved (04/2012)  . Prediabetes   . Diabetes mellitus without complication    Current Outpatient Prescriptions  Medication Sig Dispense Refill  . amLODipine (NORVASC) 10 MG tablet TAKE 1 TABLET BY MOUTH DAILY 30 tablet 5  . aspirin 81 MG tablet Take 1 tablet (81 mg total) by mouth daily. 30 tablet 5  . cloNIDine (CATAPRES) 0.2 MG tablet Take 1 tablet (0.2 mg total) by mouth 2 (two) times daily. 60 tablet 5  . diphenhydrAMINE (BENADRYL) 25 mg capsule Take 1 capsule (25 mg total) by mouth at bedtime as needed for allergies or sleep. 24 capsule 1  . gabapentin (NEURONTIN) 300 MG capsule  Take 2 capsules (600 mg total) by mouth at bedtime. 90 capsule 3  . meloxicam (MOBIC) 15 MG tablet Take 1 tablet (15 mg total) by mouth daily. 30 tablet 1  . metFORMIN (GLUCOPHAGE) 1000 MG tablet Take 1 tablet (1,000 mg total) by mouth 2 (two) times daily with a meal. 60 tablet 5  . metoprolol (LOPRESSOR) 100 MG tablet TAKE 1 AND 1/2 TABLETS BY MOUTH TWICE DAILY 90 tablet 5  . nicotine (NICODERM CQ - DOSED IN MG/24 HR) 7 mg/24hr patch Place 1 patch (7 mg total) onto the skin daily. 28 patch 0  . pravastatin (PRAVACHOL) 20 MG tablet Take 1 tablet (20 mg total) by mouth daily. 30 tablet 5   No current facility-administered medications for this visit.    Review of Systems: General: Denies fever, chills, diaphoresis, appetite change and fatigue.  Respiratory: Denies SOB, DOE, cough, and wheezing.   Cardiovascular: Denies chest pain and palpitations.  Gastrointestinal: Denies nausea, vomiting, abdominal pain, and diarrhea.  Genitourinary: Denies dysuria, increased frequency, and flank pain. Endocrine: Denies hot or cold intolerance, polyuria, and polydipsia. Musculoskeletal: Positive for back pain and right hip pain. Denies myalgias, joint swelling, and gait problem.  Skin: Denies pallor, rash and wounds.  Neurological: Denies dizziness, seizures, syncope, weakness, lightheadedness, numbness and headaches.  Psychiatric/Behavioral: Denies mood changes, and sleep disturbances.  Objective:   Physical Exam: Filed Vitals:   10/01/15 1317  BP:  155/91  Pulse: 63  Temp: 97.9 F (36.6 C)  TempSrc: Oral  Height: 6\' 2"  (1.88 m)  Weight: 224 lb 1.6 oz (101.651 kg)  SpO2: 100%    General: Alert, cooperative, NAD. HEENT: PERRL, EOMI. Moist mucus membranes Neck: Full range of motion without pain, supple, no lymphadenopathy or carotid bruits Lungs: Clear to ascultation bilaterally, normal work of respiration, no wheezes, rales, rhonchi Heart: RRR, no murmurs, gallops, or rubs Abdomen: Soft,  non-tender, non-distended, BS + Extremities: No cyanosis, clubbing, or edema Neurologic: Alert & oriented x3, cranial nerves II-XII intact, strength 4/5 in the right upper and lower extremities, sensation intact to light touch   Assessment & Plan:   Please see problem based assessment and plan.

## 2015-10-01 NOTE — Patient Instructions (Signed)
1. Please return for follow up in 6 weeks.   I have made a referral for you for podiatry  2. Please take all medications as previously prescribed with the following changes:  Continue medications as listed.   For your back pain, Start taking Neurontin 600 mg once at bed time. We can increase this medication in the future to help your pain better.   Start taking Mobic 15 mg once daily ONLY WHEN NEEDED for back pain. DO NOT take more than once daily.   3. If you have worsening of your symptoms or new symptoms arise, please call the clinic [phone]), or go to the ER immediately if symptoms are severe.

## 2015-10-02 LAB — MICROALBUMIN / CREATININE URINE RATIO
Creatinine, Urine: 41.7 mg/dL
MICROALB/CREAT RATIO: 35.7 mg/g{creat} — AB (ref 0.0–30.0)
MICROALBUM., U, RANDOM: 14.9 ug/mL

## 2015-10-02 LAB — CMP14 + ANION GAP
ALK PHOS: 69 IU/L (ref 39–117)
ALT: 28 IU/L (ref 0–44)
ANION GAP: 17 mmol/L (ref 10.0–18.0)
AST: 18 IU/L (ref 0–40)
Albumin/Globulin Ratio: 1.4 (ref 1.2–2.2)
Albumin: 4.6 g/dL (ref 3.5–5.5)
BUN/Creatinine Ratio: 12 (ref 9–20)
BUN: 9 mg/dL (ref 6–24)
Bilirubin Total: 0.3 mg/dL (ref 0.0–1.2)
CALCIUM: 9.6 mg/dL (ref 8.7–10.2)
CO2: 26 mmol/L (ref 18–29)
CREATININE: 0.73 mg/dL — AB (ref 0.76–1.27)
Chloride: 96 mmol/L (ref 96–106)
GFR calc Af Amer: 123 mL/min/{1.73_m2} (ref >59)
GFR calc non Af Amer: 107 mL/min/{1.73_m2} (ref >59)
Globulin, Total: 3.4 g/dL (ref 1.5–4.5)
Glucose: 152 mg/dL — ABNORMAL HIGH (ref 65–99)
Potassium: 3.7 mmol/L (ref 3.5–5.2)
Sodium: 139 mmol/L (ref 134–144)
Total Protein: 8 g/dL (ref 6.0–8.5)

## 2015-10-02 LAB — LIPID PANEL
CHOLESTEROL TOTAL: 151 mg/dL (ref 100–199)
Chol/HDL Ratio: 4.7 ratio units (ref 0.0–5.0)
HDL: 32 mg/dL — ABNORMAL LOW (ref >39)
LDL CALC: 90 mg/dL (ref 0–99)
TRIGLYCERIDES: 143 mg/dL (ref 0–149)
VLDL CHOLESTEROL CAL: 29 mg/dL (ref 5–40)

## 2015-10-02 NOTE — Assessment & Plan Note (Signed)
Patient with right sided residual weakness.  -Continue BP control, statin, ASA

## 2015-10-02 NOTE — Assessment & Plan Note (Signed)
Recently released, re-establishing care.

## 2015-10-02 NOTE — Assessment & Plan Note (Signed)
BP Readings from Last 3 Encounters:  10/01/15 155/91  02/25/14 144/87  03/21/13 140/86    Lab Results  Component Value Date   NA 139 10/01/2015   K 3.7 10/01/2015   CREATININE 0.73* 10/01/2015    Assessment: Blood pressure control:  BP elevated Progress toward BP goal:   Not at goal Comments: Brought in his meds that he has been taking since prison; Clonidine 0.2 mg bid (has been out of for 3 days), Norvasc 10 mg daily, Metoprolol 150 mg bid, Lisinopril 20 mg daily (not on previous, has this listed as an allergy that causes swelling. 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   Plan: Medications:  continue current medications. Refill Clonidine 0.2 mg bid. STOP ACEI since this may have causes angioedema in the past. This could be catastrophic if he had another episode of angioedema. If continued elevations in BP, could consider Spironolactone 25 mg daily.  Educational resources provided: brochure (has information) Self management tools provided:   Other plans: Checked renal function, Cr normal. RTC in 6 weeks.

## 2015-10-02 NOTE — Progress Notes (Signed)
Internal Medicine Clinic Attending  Case discussed with Dr. Jones at the time of the visit.  We reviewed the resident's history and exam and pertinent patient test results.  I agree with the assessment, diagnosis, and plan of care documented in the resident's note.  

## 2015-10-02 NOTE — Assessment & Plan Note (Addendum)
Checked lipids, CMP, HbA1c, urine microalbumin/cr. Foot exam performed, would benefit from podiatry referral for foot care

## 2015-10-02 NOTE — Assessment & Plan Note (Signed)
Patient with low back pain that is both dull and burning in nature, radiates into his legs. Has had this for some time. Also with right hip pain. Most likely has some lumbar degenerative changes resulting in some radiculopathy and arthritic pain. No alarm symptoms. Patient asking for Tramadol.  -Start Neurontin 600 mg qhs  -Also try Mobic 15 mg daily. If no improvement in pain, can consider PT, referral to sports medicine, increase in Neurontin, or trial of a different NSAID vs high dose Tylenol.  -Do not think narcotics or even Tramadol are indicated in this case.  -Could also consider hip imaging in the future as well if continued hip pain

## 2015-10-02 NOTE — Assessment & Plan Note (Signed)
Lab Results  Component Value Date   HGBA1C 7.8 10/01/2015   HGBA1C 6.1 02/25/2014   HGBA1C 5.5 01/22/2013     Assessment: Diabetes control:  Not at goal Comments: HbA1c 7.8. Has been taking Metformin 850 mg bid for some time, but has been out of this for a week or so. Does not have a meter.   Plan: Medications:  Increase Metformin to 1000 mg bid. May need addition of another oral agent in the future such as Glipizide or Januvia.  Instruction/counseling given: reminded to bring medications to each visit, discussed foot care and discussed diet Educational resources provided: brochure (has information ) Self management tools provided:   Other plans: RTC in 6 weeks.

## 2015-10-21 ENCOUNTER — Telehealth: Payer: Self-pay | Admitting: *Deleted

## 2015-10-21 NOTE — Telephone Encounter (Signed)
Phone note opened in error when trying to follow up referral - no phone call made at this time. Yvonna Alanis, RN, 10/21/15, 10:41 AM

## 2015-11-03 ENCOUNTER — Encounter: Payer: Self-pay | Admitting: *Deleted

## 2015-11-05 ENCOUNTER — Ambulatory Visit (INDEPENDENT_AMBULATORY_CARE_PROVIDER_SITE_OTHER): Payer: Medicaid Other | Admitting: Internal Medicine

## 2015-11-05 ENCOUNTER — Encounter: Payer: Self-pay | Admitting: Internal Medicine

## 2015-11-05 VITALS — BP 132/88 | HR 60 | Temp 97.7°F | Ht 74.0 in | Wt 221.6 lb

## 2015-11-05 DIAGNOSIS — E119 Type 2 diabetes mellitus without complications: Secondary | ICD-10-CM | POA: Diagnosis present

## 2015-11-05 DIAGNOSIS — Z7984 Long term (current) use of oral hypoglycemic drugs: Secondary | ICD-10-CM

## 2015-11-05 DIAGNOSIS — M5416 Radiculopathy, lumbar region: Secondary | ICD-10-CM | POA: Diagnosis not present

## 2015-11-05 DIAGNOSIS — R7309 Other abnormal glucose: Secondary | ICD-10-CM

## 2015-11-05 DIAGNOSIS — I10 Essential (primary) hypertension: Secondary | ICD-10-CM

## 2015-11-05 DIAGNOSIS — E1142 Type 2 diabetes mellitus with diabetic polyneuropathy: Secondary | ICD-10-CM

## 2015-11-05 LAB — GLUCOSE, CAPILLARY: GLUCOSE-CAPILLARY: 130 mg/dL — AB (ref 65–99)

## 2015-11-05 LAB — GLUCOSE, POCT (MANUAL RESULT ENTRY): POC GLUCOSE: 207 mg/dL — AB (ref 70–99)

## 2015-11-05 MED ORDER — GABAPENTIN 300 MG PO CAPS: 600.0000 mg | ORAL_CAPSULE | Freq: Two times a day (BID) | ORAL | Status: DC

## 2015-11-05 MED ORDER — IBUPROFEN 600 MG PO TABS: 600.0000 mg | ORAL_TABLET | Freq: Three times a day (TID) | ORAL | Status: DC | PRN

## 2015-11-05 NOTE — Assessment & Plan Note (Signed)
Advised him to increase his Metformin to 1000 mg BID. I explained that the medication making him less hungry is not necessarily a bad thing and that it was more important to get his blood sugars under better control. Will have him follow up in 2 months.

## 2015-11-05 NOTE — Assessment & Plan Note (Signed)
BP well controlled. Continue on current regimen.

## 2015-11-05 NOTE — Patient Instructions (Signed)
General Instructions: - Increase Neurontin to 600 mg twice daily - Start taking ibuprofen 600 mg up to three times daily as needed for pain - Increase Metformin to 1000 mg twice daily  - Follow up in 1 month  Thank you for bringing your medicines today. This helps [place] keep you safe from mistakes.   Progress Toward Treatment Goals:  Treatment Goal 01/22/2013  Hemoglobin A1C at goal  Blood pressure at goal  Stop smoking smoking less    Self Care Goals & Plans:  Self Care Goal 11/05/2015  Manage my medications bring my medications to every visit; refill my medications on time; take my medicines as prescribed  Monitor my health keep track of my blood glucose; bring my glucose meter and log to each visit; keep track of my blood pressure  Eat healthy foods eat more vegetables; eat foods that are low in salt; eat baked foods instead of fried foods  Be physically active find an activity I enjoy  Meeting treatment goals -    Home Blood Glucose Monitoring 01/22/2013  Check my blood sugar 3 times a day  When to check my blood sugar before meals     Care Management & Community Referrals:  Referral 07/03/2012  Referrals made for care management support diabetes educator  Referrals made to community resources nutrition

## 2015-11-05 NOTE — Assessment & Plan Note (Signed)
He did not have significant pain on exam and no limitations in ROM. I explained that his pain seemed neuropathic in nature and that medications like Gabapentin were best for treating his pain. He was adamant about getting a narcotic prescription. I do not feel narcotics were appropriate for his pain and explained to him we needed to try more conservative options first. I advised to increase his Gabapentin to 600 mg BID and try ibuprofen 600 mg TID PRN. He told me to "just forget it" and that "those medications will do nothing for my pain." I offered him physical therapy or referral to sports medicine for a steroid injection but he refused. He stated he will not be returning to our clinic because I did not give him the prescription he wanted.

## 2015-11-05 NOTE — Congregational Nurse Program (Signed)
Congregational Nurse Program Note  Date of Encounter: 11/05/2015  Past Medical History: Past Medical History  Diagnosis Date  . Tobacco abuse   . Hypertension   . Hyperlipidemia   . History of CVA (cerebrovascular accident) 02/2010    Left periventricular subcortical ischemic infarction // Residual RUE and RLE weakness  . Internal carotid artery stenosis     BL and mild per CT angiogram (05/2010)  . History of depression     surrounding original stroke - has since resolved (04/2012)  . Prediabetes   . Diabetes mellitus without complication     Encounter Details:     CNP Questionnaire - 11/05/15 1024    Patient Demographics   Is this a new or existing patient? New   Patient is considered a/an Not Applicable   Race American Indian/[place] Native   Patient Assistance   Location of Patient Assistance Not Applicable   Patient's financial/insurance status Medicaid   Uninsured Patient No   Patient referred to apply for the following financial assistance Not Applicable   Food insecurities addressed Provided food supplies   Transportation assistance No   Assistance securing medications No   Educational health offerings Chronic disease;Navigating the healthcare system;Diabetes;Nutrition;Safety   Encounter Details   Primary purpose of visit Acute Illness/Condition Visit;Chronic Illness/Condition Visit;Education/Health Concerns;Post ED/Hospitalization Visit   Was an Emergency Department visit averted? Not Applicable   Does patient have a medical provider? Yes   Patient referred to Not Applicable   Was a mental health screening completed? (GAINS tool) No   Does patient have dental issues? No   Does patient have vision issues? No   Does your patient have an abnormal blood pressure today? No   Since previous encounter, have you referred patient for abnormal blood pressure that resulted in a new diagnosis or medication change? No   Does your patient have an abnormal blood glucose today?  Yes   Since previous encounter, have you referred patient for abnormal blood glucose that resulted in a new diagnosis or medication change? No   Was there a life-saving intervention made? No       Requested dressing change.  States had his toenail removed Left great toe, yesterday.  Wound site slight serous drainage.  Clean dressings applied.  Client reports is diabetic.  Discussed with him the importance of good management of blood sugars with his recent nail removal.  Encouraged to return to clinic on a regular basis to monitor healing and CBG's.

## 2015-11-05 NOTE — Progress Notes (Signed)
   Subjective:    Patient ID: Ronald Marsh, male    DOB: December 29, 1960, 55 y.o.   [name]: [id number]  HPI Mr. Deblock is a 55yo man with PMHx of HTN, type 2 DM, prior CVA, and tobacco abuse who presents today for follow up of his hypertension.  HTN: BP well controlled at 132/88. He is taking Clonidine 0.2 mg BID, Norvasc 10 mg daily, Metoprolol 150 mg BID, and HCTZ 25 mg daily.   Type 2 DM: Last A1c 7.8. He reports taking Metformin 1000 mg daily. He was supposed to increase to 1000 mg BID per his last visit. He states he did not like taking Metformin twice daily because it made him "less hungry."  Lumbar Radiculopathy: Reports his lower back pain started in April this year. He describes the pain as 8/10 in severity, radiates down the back of both his legs, and is unable to describe the quality of the pain but states it "feels similar to hitting your elbow." He reports the pain is worse after sitting down for a long period of time and then getting up to walk, but notes pain is better when sitting down in general. He states stretching helps the pain. Denies any trauma. He has tried Tylenol 4-5 times daily without relief. He notes ibuprofen 800 mg mildly relieved his pain. He was started on Neurontin 600 mg QHS and Mobic 15 mg daily at his last visit but states neither of these medications decrease the pain at all. He notes he recently had toe surgery on his left foot where a toenail was removed and he was given a prescription for Norco. He reports the Norco completely resolved his pain. He was requesting Tramadol repeatedly throughout the interview. Denies any warning symptoms including saddle anesthesia, difficulty going up stairs, and loss of bowel or bladder function.   Review of Systems General: Denies fever, chills, night sweats, changes in weight, changes in appetite HEENT: Denies headaches, ear pain, changes in vision, rhinorrhea, sore throat CV: Denies CP, palpitations, SOB, orthopnea Pulm: Denies  SOB, cough, wheezing GI: Denies abdominal pain, nausea, vomiting, diarrhea, constipation, melena, hematochezia GU: Denies dysuria, hematuria, frequency Msk: See HPI Neuro: See HPI Skin: Denies rashes, bruising Psych: Denies depression, anxiety, hallucinations    Objective:   Physical Exam General: middle aged man sitting up, NAD HEENT: [place]/AT, EOMI, sclera anicteric, mucus membranes moist CV: RRR, no m/g/r Pulm: CTA bilaterally, breaths non-labored Abd: BS+, soft, non-tender Back: no tenderness to palpation of spinous processes Ext: warm, no peripheral edema. Full ROM in lower extremities. Minimal tenderness elicited with straight leg raise bilaterally.  Neuro: alert and oriented x 3. Strength 5/5 in upper and lower extremities.    Assessment & Plan:  Please refer to A&P documentation.

## 2015-11-09 NOTE — Progress Notes (Signed)
Internal Medicine Clinic Attending  Case discussed with Dr. Arcelia Jew at the time of the visit.  We reviewed the resident's history and exam and pertinent patient test results.  I agree with the assessment, diagnosis, and plan of care documented in the resident's note.  Agree with Dr. Shela Commons assessment that narcotics are not appropriate for his type of pain and given the physical exam.  He is underdosed on his gabapentin and uptitration of that medication as a first line along with recommendation for PT is appropriate.

## 2015-11-19 DIAGNOSIS — Z5189 Encounter for other specified aftercare: Secondary | ICD-10-CM

## 2015-11-26 DIAGNOSIS — Z5189 Encounter for other specified aftercare: Secondary | ICD-10-CM

## 2015-12-01 ENCOUNTER — Encounter: Payer: Medicaid Other | Admitting: Internal Medicine

## 2015-12-01 ENCOUNTER — Encounter: Payer: Self-pay | Admitting: Internal Medicine

## 2015-12-01 DIAGNOSIS — Z5189 Encounter for other specified aftercare: Secondary | ICD-10-CM

## 2015-12-10 DIAGNOSIS — R7309 Other abnormal glucose: Secondary | ICD-10-CM

## 2015-12-16 LAB — GLUCOSE, POCT (MANUAL RESULT ENTRY): POC Glucose: 198 mg/dl — AB (ref 70–99)

## 2015-12-16 NOTE — Congregational Nurse Program (Signed)
Congregational Nurse Program Note  Date of Encounter: 11/19/2015  Past Medical History: Past Medical History:  Diagnosis Date  . Diabetes mellitus without complication ([place])   . History of CVA (cerebrovascular accident) 02/2010   Left periventricular subcortical ischemic infarction // Residual RUE and RLE weakness  . History of depression    surrounding original stroke - has since resolved (04/2012)  . Hyperlipidemia   . Hypertension   . Internal carotid artery stenosis    BL and mild per CT angiogram (05/2010)  . Prediabetes   . Tobacco abuse     Encounter Details:     CNP Questionnaire - 11/19/15 2006      Patient Demographics   Is this a new or existing patient? Existing   Patient is considered a/an Not Applicable   Race American Indian/[place] Native     Patient Assistance   Location of Patient Assistance Not Applicable   Patient's financial/insurance status Medicaid   Uninsured Patient No   Patient referred to apply for the following financial assistance Not Applicable   Food insecurities addressed Provided food supplies   Transportation assistance No   Assistance securing medications No   Educational health offerings Chronic disease;Navigating the healthcare system;Diabetes;Nutrition;Safety     Encounter Details   Primary purpose of visit Acute Illness/Condition Visit;Chronic Illness/Condition Visit;Education/Health Concerns   Was an Emergency Department visit averted? Not Applicable   Does patient have a medical provider? Yes   Patient referred to Not Applicable   Was a mental health screening completed? (GAINS tool) No   Does patient have dental issues? No   Does patient have vision issues? No   Does your patient have an abnormal blood pressure today? No   Since previous encounter, have you referred patient for abnormal blood pressure that resulted in a new diagnosis or medication change? No   Does your patient have an abnormal blood glucose today? No   Since  previous encounter, have you referred patient for abnormal blood glucose that resulted in a new diagnosis or medication change? No   Was there a life-saving intervention made? No      Dressings changed on left foot (great toe).  No signs of infection.  Dressings provided to client so he can change dressings as needed

## 2015-12-16 NOTE — Congregational Nurse Program (Signed)
Congregational Nurse Program Note  Date of Encounter: 11/26/2015  Past Medical History: Past Medical History:  Diagnosis Date  . Diabetes mellitus without complication ([place])   . History of CVA (cerebrovascular accident) 02/2010   Left periventricular subcortical ischemic infarction // Residual RUE and RLE weakness  . History of depression    surrounding original stroke - has since resolved (04/2012)  . Hyperlipidemia   . Hypertension   . Internal carotid artery stenosis    BL and mild per CT angiogram (05/2010)  . Prediabetes   . Tobacco abuse     Encounter Details:     CNP Questionnaire - 11/26/15 2008      Patient Demographics   Is this a new or existing patient? Existing   Patient is considered a/an Not Applicable   Race American Indian/[place] Native     Patient Assistance   Location of Patient Assistance Not Applicable   Patient's financial/insurance status Medicaid   Uninsured Patient No   Patient referred to apply for the following financial assistance Not Applicable   Food insecurities addressed Provided food supplies   Transportation assistance No   Assistance securing medications No   Educational health offerings Chronic disease;Navigating the healthcare system;Diabetes;Nutrition;Safety     Encounter Details   Primary purpose of visit Acute Illness/Condition Visit;Chronic Illness/Condition Visit;Education/Health Concerns   Was an Emergency Department visit averted? Not Applicable   Does patient have a medical provider? Yes   Patient referred to Not Applicable   Was a mental health screening completed? (GAINS tool) No   Does patient have dental issues? No   Does patient have vision issues? No   Does your patient have an abnormal blood pressure today? No   Since previous encounter, have you referred patient for abnormal blood pressure that resulted in a new diagnosis or medication change? No   Does your patient have an abnormal blood glucose today? No   Since  previous encounter, have you referred patient for abnormal blood glucose that resulted in a new diagnosis or medication change? No   Was there a life-saving intervention made? No      Dressage changed left foot, great toe.  Some swelling noted and serous drainage.  Encouraged him to make appointment with podiatrist.

## 2015-12-16 NOTE — Congregational Nurse Program (Signed)
Congregational Nurse Program Note  Date of Encounter: 12/01/2015  Past Medical History: Past Medical History:  Diagnosis Date  . Diabetes mellitus without complication ([place])   . History of CVA (cerebrovascular accident) 02/2010   Left periventricular subcortical ischemic infarction // Residual RUE and RLE weakness  . History of depression    surrounding original stroke - has since resolved (04/2012)  . Hyperlipidemia   . Hypertension   . Internal carotid artery stenosis    BL and mild per CT angiogram (05/2010)  . Prediabetes   . Tobacco abuse     Encounter Details:     CNP Questionnaire - 11/26/15 2010      Patient Demographics   Is this a new or existing patient? Existing   Patient is considered a/an Not Applicable   Race American Indian/[place] Native     Patient Assistance   Location of Patient Assistance Not Applicable   Patient's financial/insurance status Medicaid   Uninsured Patient No   Patient referred to apply for the following financial assistance Not Applicable   Food insecurities addressed Provided food supplies   Transportation assistance No   Assistance securing medications No   Educational health offerings Chronic disease;Navigating the healthcare system;Diabetes;Nutrition;Safety     Encounter Details   Primary purpose of visit Acute Illness/Condition Visit;Chronic Illness/Condition Visit;Education/Health Concerns   Was an Emergency Department visit averted? Not Applicable   Does patient have a medical provider? Yes   Patient referred to Not Applicable   Was a mental health screening completed? (GAINS tool) No   Does patient have dental issues? No   Does patient have vision issues? No   Does your patient have an abnormal blood pressure today? No   Since previous encounter, have you referred patient for abnormal blood pressure that resulted in a new diagnosis or medication change? No   Does your patient have an abnormal blood glucose today? No   Since  previous encounter, have you referred patient for abnormal blood glucose that resulted in a new diagnosis or medication change? No   Was there a life-saving intervention made? No      Dressings changed left foot, great toe.  Swelling reduced.  States saw the podiatrist and the report is the toe is healing.

## 2015-12-16 NOTE — Congregational Nurse Program (Signed)
Congregational Nurse Program Note  Date of Encounter: 12/10/2015  Past Medical History: Past Medical History:  Diagnosis Date  . Diabetes mellitus without complication ([place])   . History of CVA (cerebrovascular accident) 02/2010   Left periventricular subcortical ischemic infarction // Residual RUE and RLE weakness  . History of depression    surrounding original stroke - has since resolved (04/2012)  . Hyperlipidemia   . Hypertension   . Internal carotid artery stenosis    BL and mild per CT angiogram (05/2010)  . Prediabetes   . Tobacco abuse     Encounter Details:     CNP Questionnaire - 12/10/15 2011      Patient Demographics   Is this a new or existing patient? Existing   Patient is considered a/an Not Applicable   Race American Indian/[place] Native     Patient Assistance   Location of Patient Assistance Not Applicable   Patient's financial/insurance status Medicaid   Uninsured Patient No   Patient referred to apply for the following financial assistance Not Applicable   Food insecurities addressed Provided food supplies   Transportation assistance No   Assistance securing medications No   Educational health offerings Chronic disease;Navigating the healthcare system;Diabetes;Nutrition;Safety     Encounter Details   Primary purpose of visit Acute Illness/Condition Visit;Chronic Illness/Condition Visit;Education/Health Concerns   Was an Emergency Department visit averted? Not Applicable   Does patient have a medical provider? Yes   Patient referred to Not Applicable   Was a mental health screening completed? (GAINS tool) No   Does patient have dental issues? No   Does patient have vision issues? No   Does your patient have an abnormal blood pressure today? No   Since previous encounter, have you referred patient for abnormal blood pressure that resulted in a new diagnosis or medication change? No   Does your patient have an abnormal blood glucose today? Yes   Since previous encounter, have you referred patient for abnormal blood glucose that resulted in a new diagnosis or medication change? No   Was there a life-saving intervention made? No      No dressings on foot.  Healing in process.  No swelling or drainage.  CBG and B/P check.

## 2016-02-19 DIAGNOSIS — F1911 Other psychoactive substance abuse, in remission: Secondary | ICD-10-CM | POA: Insufficient documentation

## 2016-03-21 ENCOUNTER — Telehealth: Payer: Self-pay | Admitting: Internal Medicine

## 2016-03-21 DIAGNOSIS — E785 Hyperlipidemia, unspecified: Secondary | ICD-10-CM

## 2016-03-21 DIAGNOSIS — I10 Essential (primary) hypertension: Secondary | ICD-10-CM

## 2016-03-22 NOTE — Telephone Encounter (Signed)
Pt due for a visit-will send request to pcp for consideration and to front office for next avail with pcp.Despina Hidden Cassady11/14/201710:56 AM

## 2016-04-06 ENCOUNTER — Other Ambulatory Visit: Payer: Self-pay | Admitting: Internal Medicine

## 2016-04-06 DIAGNOSIS — E1142 Type 2 diabetes mellitus with diabetic polyneuropathy: Secondary | ICD-10-CM

## 2016-04-06 DIAGNOSIS — I10 Essential (primary) hypertension: Secondary | ICD-10-CM

## 2016-04-06 NOTE — Telephone Encounter (Signed)
Attempted to call patient today, but he was unavailable.  I spoke with his wife.  Left message with her about future appt with Dr. Hetty Ely on 06-07-16 @ 1:15 pm.

## 2016-04-18 ENCOUNTER — Other Ambulatory Visit: Payer: Self-pay | Admitting: Internal Medicine

## 2016-04-18 DIAGNOSIS — I10 Essential (primary) hypertension: Secondary | ICD-10-CM

## 2016-04-18 DIAGNOSIS — E785 Hyperlipidemia, unspecified: Secondary | ICD-10-CM

## 2016-05-15 ENCOUNTER — Other Ambulatory Visit: Payer: Self-pay | Admitting: Internal Medicine

## 2016-05-15 DIAGNOSIS — I10 Essential (primary) hypertension: Secondary | ICD-10-CM

## 2016-06-06 ENCOUNTER — Telehealth: Payer: Self-pay | Admitting: Internal Medicine

## 2016-06-06 NOTE — Telephone Encounter (Signed)
APT. REMINDER CALL, LMTCB °

## 2016-06-06 NOTE — Progress Notes (Deleted)
   CC: ***   HPI: Mr.Ryson Valera is a 56 y.o. with past medical history as outlined below who presents to clinic for follow up of ***  Please see problem list for status of the pt's chronic medical problems.  Past Medical History:  Diagnosis Date  . Diabetes mellitus without complication ([place])   . History of CVA (cerebrovascular accident) 02/2010   Left periventricular subcortical ischemic infarction // Residual RUE and RLE weakness  . History of depression    surrounding original stroke - has since resolved (04/2012)  . Hyperlipidemia   . Hypertension   . Internal carotid artery stenosis    BL and mild per CT angiogram (05/2010)  . Prediabetes   . Tobacco abuse     Review of Systems:  Please see each problem below for a pertinent review of systems.  Physical Exam:  There were no vitals filed for this visit. Physical Exam***   Assessment & Plan:   See Encounters Tab for problem based charting.  Refills?  -flu vaccine  -A1c  Health maintenance  -colonoscopy  -retinal eye exam  -hep c screen   HTN ** start lisinopril 10 mg for proteinuria  - metoprolol 150 mg BID  - clonidine 0.2 mg qd  -amlodipine 10 mg qd   Type 2 DM   episodes of hypoglycemia- headache, lightheadedness, diaphoresis, palpitations, weakness. Meter readings? Lab Results  Component Value Date   HGBA1C 7.8 10/01/2015  09/2015 Urine Microalbumin/creatinine ratio 35.7  BP Readings from Last 3 Encounters:  12/10/15 120/86  11/05/15 132/88  11/05/15 120/80  Goal <130/80  Foot exam -  02/2014 Opthalmology exam - no retinopathy   -metformin 1000mg  BID  -initiate ACE- I therapy for proteinuria  -referral for retinal eye exam   Tobacco abuse  -nicotine patch  Lumbar radiculopathy  -ibuprofen 600 mg q8h prn  -gabapentin 600 mg BID  Hx of ischemic CVA - left periventricular  -pravastatin 20 mg and aspirin 81  HLD  -pravastatin    Patient {GC/[id number]::"discussed with","seen with"}  Dr. {[id number]::"Butcher","Granfortuna","E. Hoffman","Klima","Mullen","Narendra","Vincent"}

## 2016-06-07 ENCOUNTER — Encounter: Payer: Medicaid Other | Admitting: Internal Medicine

## 2016-06-09 ENCOUNTER — Other Ambulatory Visit: Payer: Self-pay | Admitting: Internal Medicine

## 2016-06-09 DIAGNOSIS — I10 Essential (primary) hypertension: Secondary | ICD-10-CM

## 2016-06-16 ENCOUNTER — Encounter: Payer: Self-pay | Admitting: Internal Medicine

## 2016-06-21 ENCOUNTER — Ambulatory Visit: Payer: Self-pay | Admitting: Oral Surgery

## 2016-06-29 ENCOUNTER — Ambulatory Visit: Payer: Medicaid Other

## 2016-07-01 ENCOUNTER — Other Ambulatory Visit: Payer: Self-pay | Admitting: Internal Medicine

## 2016-07-01 DIAGNOSIS — E1142 Type 2 diabetes mellitus with diabetic polyneuropathy: Secondary | ICD-10-CM

## 2016-07-04 ENCOUNTER — Encounter: Payer: Self-pay | Admitting: Internal Medicine

## 2016-07-15 ENCOUNTER — Emergency Department (HOSPITAL_COMMUNITY)
Admission: EM | Admit: 2016-07-15 | Discharge: 2016-07-15 | Disposition: A | Discharge status: Home / Self Care | Payer: Medicaid Other | Attending: Emergency Medicine | Admitting: Emergency Medicine

## 2016-07-15 ENCOUNTER — Encounter (HOSPITAL_COMMUNITY): Payer: Self-pay

## 2016-07-15 DIAGNOSIS — Z87891 Personal history of nicotine dependence: Secondary | ICD-10-CM | POA: Insufficient documentation

## 2016-07-15 DIAGNOSIS — E119 Type 2 diabetes mellitus without complications: Secondary | ICD-10-CM | POA: Diagnosis not present

## 2016-07-15 DIAGNOSIS — M5416 Radiculopathy, lumbar region: Secondary | ICD-10-CM | POA: Diagnosis not present

## 2016-07-15 DIAGNOSIS — Z7982 Long term (current) use of aspirin: Secondary | ICD-10-CM | POA: Insufficient documentation

## 2016-07-15 DIAGNOSIS — Z7984 Long term (current) use of oral hypoglycemic drugs: Secondary | ICD-10-CM | POA: Diagnosis not present

## 2016-07-15 DIAGNOSIS — Z8673 Personal history of transient ischemic attack (TIA), and cerebral infarction without residual deficits: Secondary | ICD-10-CM | POA: Diagnosis not present

## 2016-07-15 DIAGNOSIS — Z79899 Other long term (current) drug therapy: Secondary | ICD-10-CM | POA: Insufficient documentation

## 2016-07-15 DIAGNOSIS — I10 Essential (primary) hypertension: Secondary | ICD-10-CM | POA: Insufficient documentation

## 2016-07-15 DIAGNOSIS — M545 Low back pain: Secondary | ICD-10-CM | POA: Diagnosis present

## 2016-07-15 MED ORDER — METHOCARBAMOL 500 MG PO TABS: 500.0000 mg | ORAL_TABLET | Freq: Two times a day (BID) | ORAL | 0 refills | Status: DC

## 2016-07-15 MED ORDER — KETOROLAC TROMETHAMINE 60 MG/2ML IM SOLN
60.0000 mg | Freq: Once | INTRAMUSCULAR | Status: AC
Start: 2016-07-15 — End: 2016-07-15
  Administered 2016-07-15: 60 mg via INTRAMUSCULAR
  Filled 2016-07-15: qty 2

## 2016-07-15 MED ORDER — NAPROXEN 500 MG PO TABS: 500.0000 mg | ORAL_TABLET | Freq: Two times a day (BID) | ORAL | 0 refills | Status: DC

## 2016-07-15 NOTE — ED Provider Notes (Signed)
[facility] DEPT Provider Note   CSN: [id number] Arrival date & time: 07/15/16  0719     History   Chief Complaint Chief Complaint  Patient presents with  . Back Pain    HPI Ronald Marsh is a 56 y.o. male.  The history is provided by the patient and medical records.    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 Patient reports he suffered a stroke in 2012 and has had ongoing back pain ever since. Reports he always has low back pain with radiation into the right leg. States seems to wax and wane in severity, but never fully resolves. Reports pain got worse yesterday. He denies any injury,,, or falls. States when pain is severe he feels as if his right leg will "give out". States she is supposed to walk with a cane, however he "forgot it" today. He denies any numbness or new weakness of his legs. No bowel or bladder incontinence. States his primary care doctor has him on gabapentin, however he has stopped taking it because he does not feel it works for him.  Has not tried any other medications for his symptoms.  Past Medical History:  Diagnosis Date  . Diabetes mellitus without complication ([place])   . History of CVA (cerebrovascular accident) 02/2010   Left periventricular subcortical ischemic infarction // Residual RUE and RLE weakness  . History of depression    surrounding original stroke - has since resolved (04/2012)  . Hyperlipidemia   . Hypertension   . Internal carotid artery stenosis    BL and mild per CT angiogram (05/2010)  . Prediabetes   . Tobacco abuse     Patient Active Problem List   Diagnosis Date Noted  . Lumbar radiculopathy 10/01/2015  . Diabetes mellitus type 2, controlled ([place]) 07/03/2012  . Preventative health care 04/25/2012  . Tobacco abuse   . Hypertension   . Hyperlipidemia   . History of  CVA (cerebrovascular accident) 02/06/2010    History reviewed. No pertinent surgical history.     Home Medications    Prior to Admission medications   Medication Sig Start Date End Date Taking? Authorizing Provider  amLODipine (NORVASC) 10 MG tablet TAKE 1 TABLET BY MOUTH DAILY 04/19/16   Ledell Noss, MD  aspirin 81 MG tablet Take 1 tablet (81 mg total) by mouth daily. 10/01/15   Corky Sox, MD  cloNIDine (CATAPRES) 0.2 MG tablet TAKE 1 TABLET BY MOUTH TWICE DAILY 06/09/16   Ledell Noss, MD  diphenhydrAMINE (BENADRYL) 25 mg capsule Take 1 capsule (25 mg total) by mouth at bedtime as needed for allergies or sleep. 07/03/12 07/03/13  Wilber Oliphant, MD  gabapentin (NEURONTIN) 300 MG capsule Take 2 capsules (600 mg total) by mouth 2 (two) times daily. 11/05/15   Carly Montey Hora, MD  ibuprofen (ADVIL,MOTRIN) 600 MG tablet Take 1 tablet (600 mg total) by mouth every 8 (eight) hours as needed. 11/05/15   Juliet Rude, MD  metFORMIN (GLUCOPHAGE) 1000 MG tablet TAKE 1 TABLET BY MOUTH TWICE DAILY WITH A MEAL 07/04/16   Ledell Noss, MD  metoprolol (LOPRESSOR) 100 MG tablet TAKE 1 AND 1/2 TABLETS BY MOUTH TWICE DAILY 05/17/16   Ledell Noss, MD  nicotine (NICODERM CQ - DOSED IN MG/24 HR) 7 mg/24hr patch Place 1 patch (7 mg total) onto the skin daily. 02/25/14   Wilber Oliphant, MD  pravastatin (PRAVACHOL) 20 MG tablet TAKE 1 TABLET BY MOUTH DAILY 04/19/16   Ledell Noss, MD    Family History Family History  Problem Relation Age of Onset  . Diabetes Mother   . Heart failure Mother   . Hypertension Mother   . Hyperlipidemia Mother   . Glaucoma Mother   . Lung cancer Father   . Hypertension Brother   . Diabetes Sister   . Arthritis Sister     Social History Social History  Substance Use Topics  . Smoking status: Former Smoker    Packs/day: 0.30    Years: 31.00    Types: Cigarettes    Start date: 04/25/1981  . Smokeless tobacco: Never Used     Comment: up to 3 cigs/day; cutting back.  . Alcohol use  Yes     Comment: occasional     Allergies   Benicar [olmesartan]; Ace inhibitors; and Angiotensin receptor blockers   Review of Systems Review of Systems  Musculoskeletal: Positive for back pain.  All other systems reviewed and are negative.    Physical Exam Updated Vital Signs BP 148/92 (BP Location: Right Arm) Comment: Simultaneous filing. User may not have seen previous data.  Pulse 70 Comment: Simultaneous filing. User may not have seen previous data.  Temp 97.5 F (36.4 C) (Oral)   Resp 16   Ht 6' 2.5" (1.892 m)   Wt 99.8 kg   SpO2 100% Comment: Simultaneous filing. User may not have seen previous data.  BMI 27.87 kg/m   Physical Exam  Constitutional: He is oriented to person, place, and time. He appears well-developed and well-nourished.  HENT:  Head: Normocephalic and atraumatic.  Mouth/Throat: Oropharynx is clear and moist.  Eyes: Conjunctivae and EOM are normal. Pupils are equal, round, and reactive to light.  Neck: Normal range of motion.  Cardiovascular: Normal rate, regular rhythm and normal heart sounds.   Pulmonary/Chest: Effort normal and breath sounds normal. No respiratory distress. He has no wheezes.  Abdominal: Soft. Bowel sounds are normal.  Musculoskeletal: Normal range of motion.  Mild tenderness of right lumbar paraspinal region without noted deformity or step-off; ROM remains intact; mild weakness of the right leg compared with left (baseline); ambulatory unassisted in ED  Neurological: He is alert and oriented to person, place, and time.  Skin: Skin is warm and dry.  Psychiatric: He has a normal mood and affect.  Nursing note and vitals reviewed.    ED Treatments / Results  Labs (all labs ordered are listed, but only abnormal results are displayed) Labs Reviewed - No data to display  EKG  EKG Interpretation None       Radiology No results found.  Procedures Procedures (including critical care time)  Medications Ordered in  ED Medications  ketorolac (TORADOL) injection 60 mg (60 mg Intramuscular Given 07/15/16 0808)     Initial Impression / Assessment and Plan / ED Course  I have reviewed the triage vital signs and the nursing notes.  Pertinent labs & imaging results that were available during my care of the patient were reviewed by me and considered in my medical decision making (see chart for details).  56 year old male who with back pain. This is been a chronic issue since 2012. He is afebrile and nontoxic. He has no focal neurologic deficits were red flag symptoms concerning for cauda equina. He has slightly decreased strength of the right leg compared with left which is baseline. He remains ambulatory, even without his cane which he is supposed  to use daily. Patient given injection of Toradol here. Will discharge home on muscle relaxants and anti-inflammatories. He was instructed to follow-up with his primary care doctor.  Discussed plan with patient, he acknowledged understanding and agreed with plan of care.  Return precautions given for new or worsening symptoms.  Final Clinical Impressions(s) / ED Diagnoses   Final diagnoses:  Lumbar radiculopathy    New Prescriptions New Prescriptions   METHOCARBAMOL (ROBAXIN) 500 MG TABLET    Take 1 tablet (500 mg total) by mouth 2 (two) times daily.   NAPROXEN (NAPROSYN) 500 MG TABLET    Take 1 tablet (500 mg total) by mouth 2 (two) times daily with a meal.     Larene Pickett, PA-C 07/15/16 [place], MD 07/15/16 [phone]

## 2016-07-15 NOTE — Discharge Instructions (Signed)
Take the prescribed medication as directed.  May wish to use heating pad, warm compress, etc to your back to help with pain. Follow-up with your primary care doctor. Return to the ED for new or worsening symptoms.

## 2016-07-15 NOTE — ED Triage Notes (Signed)
Pt. Coming in with lower back pain that started in 2012. He had a stroke in 2012 and the back pain followed. Pain is a shooting cramplike pain that starts in the lower back and shoots all the way down the leg. It's been worse today and uncontrolled. Back pain often makes legs go numb. Has been taking Gabapentin, but has not worked. Pain is worse when walking.

## 2016-09-06 ENCOUNTER — Emergency Department (HOSPITAL_COMMUNITY)
Admission: EM | Admit: 2016-09-06 | Discharge: 2016-09-06 | Disposition: A | Discharge status: Home / Self Care | Payer: Medicaid Other | Attending: Physician Assistant | Admitting: Physician Assistant

## 2016-09-06 ENCOUNTER — Encounter (HOSPITAL_COMMUNITY): Payer: Self-pay | Admitting: Emergency Medicine

## 2016-09-06 DIAGNOSIS — Z87891 Personal history of nicotine dependence: Secondary | ICD-10-CM | POA: Diagnosis not present

## 2016-09-06 DIAGNOSIS — Z7982 Long term (current) use of aspirin: Secondary | ICD-10-CM | POA: Diagnosis not present

## 2016-09-06 DIAGNOSIS — E119 Type 2 diabetes mellitus without complications: Secondary | ICD-10-CM | POA: Diagnosis not present

## 2016-09-06 DIAGNOSIS — I10 Essential (primary) hypertension: Secondary | ICD-10-CM | POA: Insufficient documentation

## 2016-09-06 DIAGNOSIS — Z79899 Other long term (current) drug therapy: Secondary | ICD-10-CM | POA: Insufficient documentation

## 2016-09-06 DIAGNOSIS — Z760 Encounter for issue of repeat prescription: Secondary | ICD-10-CM

## 2016-09-06 DIAGNOSIS — R51 Headache: Secondary | ICD-10-CM | POA: Diagnosis not present

## 2016-09-06 DIAGNOSIS — Z8673 Personal history of transient ischemic attack (TIA), and cerebral infarction without residual deficits: Secondary | ICD-10-CM | POA: Diagnosis not present

## 2016-09-06 LAB — CBG MONITORING, ED: GLUCOSE-CAPILLARY: 198 mg/dL — AB (ref 65–99)

## 2016-09-06 MED ORDER — METOPROLOL TARTRATE 25 MG PO TABS
100.0000 mg | ORAL_TABLET | Freq: Once | ORAL | Status: AC
  Administered 2016-09-06: 100 mg via ORAL
  Filled 2016-09-06: qty 4

## 2016-09-06 MED ORDER — METFORMIN HCL 500 MG PO TABS: 1000.0000 mg | ORAL_TABLET | Freq: Every day | ORAL | 0 refills | Status: DC

## 2016-09-06 MED ORDER — CLONIDINE HCL 0.2 MG PO TABS
0.2000 mg | ORAL_TABLET | Freq: Once | ORAL | Status: AC
  Administered 2016-09-06: 0.2 mg via ORAL
  Filled 2016-09-06: qty 1

## 2016-09-06 MED ORDER — ACETAMINOPHEN 325 MG PO TABS
650.0000 mg | ORAL_TABLET | Freq: Once | ORAL | Status: AC
  Administered 2016-09-06: 650 mg via ORAL
  Filled 2016-09-06: qty 2

## 2016-09-06 MED ORDER — CLONIDINE HCL 0.2 MG PO TABS: 0.2000 mg | ORAL_TABLET | Freq: Two times a day (BID) | ORAL | 0 refills | Status: DC

## 2016-09-06 MED ORDER — METOPROLOL TARTRATE 100 MG PO TABS: 50.0000 mg | ORAL_TABLET | Freq: Two times a day (BID) | ORAL | 0 refills | Status: DC

## 2016-09-06 MED ORDER — IBUPROFEN 800 MG PO TABS
800.0000 mg | ORAL_TABLET | Freq: Once | ORAL | Status: AC
  Administered 2016-09-06: 800 mg via ORAL
  Filled 2016-09-06: qty 1
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
## 2016-09-06 NOTE — ED Notes (Signed)
Pt stable, understands discharge instructions, and reasons for return.   

## 2016-09-06 NOTE — Discharge Instructions (Signed)
We want you to follow up with a primary care physician that can provide the medications. We have given you 20 days of some of your medications but it is necessary that you follow-up with her primary care physician to get more.  To find a primary care or specialty doctor please call [phone] or [phone] to access "[facility]."  You may also go on the [facility] website at [email or web address]  There are also multiple [facility] and [facility] practices throughout the [place] that are frequently accepting new patients. You may find a clinic that is close to your home and contact them.  [facility] -  [address] [place] [address] [phone]  [facility] in [place] (also locations in [place] and [place]) -  [place] [address] [address]  [place] [address] [phone]

## 2016-09-12 ENCOUNTER — Other Ambulatory Visit: Payer: Self-pay | Admitting: Internal Medicine

## 2016-09-12 DIAGNOSIS — I10 Essential (primary) hypertension: Secondary | ICD-10-CM

## 2016-09-12 DIAGNOSIS — E785 Hyperlipidemia, unspecified: Secondary | ICD-10-CM

## 2016-09-12 DIAGNOSIS — E1142 Type 2 diabetes mellitus with diabetic polyneuropathy: Secondary | ICD-10-CM

## 2017-04-29 ENCOUNTER — Emergency Department (HOSPITAL_COMMUNITY)
Admission: EM | Admit: 2017-04-29 | Discharge: 2017-04-29 | Discharge status: Against Medical Advice | Payer: Medicaid Other | Attending: Emergency Medicine | Admitting: Emergency Medicine

## 2017-04-29 ENCOUNTER — Encounter (HOSPITAL_COMMUNITY): Payer: Self-pay

## 2017-04-29 ENCOUNTER — Other Ambulatory Visit: Payer: Self-pay

## 2017-04-29 DIAGNOSIS — J069 Acute upper respiratory infection, unspecified: Secondary | ICD-10-CM | POA: Insufficient documentation

## 2017-04-29 DIAGNOSIS — Z5321 Procedure and treatment not carried out due to patient leaving prior to being seen by health care provider: Secondary | ICD-10-CM | POA: Insufficient documentation

## 2017-04-29 NOTE — ED Triage Notes (Signed)
Pt states URI symptoms X3 days. States he has had cough, nasal congestion. Pt states sx starting to improve. No distress noted.

## 2017-04-29 NOTE — ED Notes (Signed)
Patient was called 3x for room, no answer.

## 2017-05-02 ENCOUNTER — Encounter (HOSPITAL_COMMUNITY): Payer: Self-pay | Admitting: Emergency Medicine

## 2017-05-02 ENCOUNTER — Emergency Department (HOSPITAL_COMMUNITY): Payer: Medicaid Other

## 2017-05-02 ENCOUNTER — Emergency Department (HOSPITAL_COMMUNITY)
Admission: EM | Admit: 2017-05-02 | Discharge: 2017-05-02 | Disposition: A | Discharge status: Home / Self Care | Payer: Medicaid Other | Attending: Emergency Medicine | Admitting: Emergency Medicine

## 2017-05-02 ENCOUNTER — Other Ambulatory Visit: Payer: Self-pay

## 2017-05-02 DIAGNOSIS — E119 Type 2 diabetes mellitus without complications: Secondary | ICD-10-CM | POA: Diagnosis not present

## 2017-05-02 DIAGNOSIS — I10 Essential (primary) hypertension: Secondary | ICD-10-CM | POA: Insufficient documentation

## 2017-05-02 DIAGNOSIS — Z79899 Other long term (current) drug therapy: Secondary | ICD-10-CM | POA: Diagnosis not present

## 2017-05-02 DIAGNOSIS — Z7984 Long term (current) use of oral hypoglycemic drugs: Secondary | ICD-10-CM | POA: Diagnosis not present

## 2017-05-02 DIAGNOSIS — R05 Cough: Secondary | ICD-10-CM | POA: Diagnosis present

## 2017-05-02 DIAGNOSIS — Z87891 Personal history of nicotine dependence: Secondary | ICD-10-CM | POA: Insufficient documentation

## 2017-05-02 DIAGNOSIS — J40 Bronchitis, not specified as acute or chronic: Secondary | ICD-10-CM

## 2017-05-02 DIAGNOSIS — J029 Acute pharyngitis, unspecified: Secondary | ICD-10-CM

## 2017-05-02 MED ORDER — AMLODIPINE BESYLATE 10 MG PO TABS: 10.0000 mg | ORAL_TABLET | Freq: Every day | ORAL | 0 refills | Status: DC

## 2017-05-02 MED ORDER — AZITHROMYCIN 250 MG PO TABS: ORAL_TABLET | ORAL | 0 refills | Status: DC

## 2017-05-02 MED ORDER — METFORMIN HCL 500 MG PO TABS: 500.0000 mg | ORAL_TABLET | Freq: Two times a day (BID) | ORAL | 0 refills | Status: DC

## 2017-05-02 MED ORDER — METOPROLOL TARTRATE 50 MG PO TABS: 50.0000 mg | ORAL_TABLET | Freq: Two times a day (BID) | ORAL | 0 refills | Status: DC

## 2017-05-02 NOTE — Discharge Instructions (Signed)
Take your blood pressure meds and metformin as prescribed.  Take zpack as prescribed for bronchitis.   See your doctor for follow up   Return to ER if you have worse shortness of breath, cough, fever, chest pain

## 2017-05-02 NOTE — ED Triage Notes (Signed)
Pt c/o sore throat x 4 days, URI symptoms started Saturday. Was here on Saturday, left before being seen, -- taking OTC meds, but is out of them now.  Pt normally takes bP meds at 8pm.

## 2017-05-02 NOTE — ED Notes (Addendum)
Pt verbalized understanding of d/c instructions and has no further questions. VSS, NAD. Unable to obtain e-signature due to broken signature pad.

## 2017-05-02 NOTE — ED Provider Notes (Signed)
[facility]ERGENCY DEPARTMENT Provider Note   CSN: [id number] Arrival date & time: 05/02/17  1650     History   Chief Complaint Chief Complaint  Patient presents with  . Sore Throat  . URI    HPI Vedanth Sirico is a 56 y.o. male history diabetes, hypertension, here presenting with sore throat, cough.  Patient just got out of prison about 3 days ago.  Patient states that he was taken off of his Metformin and has not been very compliant with his blood pressure medicine.  Has been having cough for the last 3 days as well as a sore throat.  Has some productive cough with greenish sputum.  Denies any vomiting or fevers.  The history is provided by the patient.    Past Medical History:  Diagnosis Date  . Diabetes mellitus without complication ([place])   . History of CVA (cerebrovascular accident) 02/2010   Left periventricular subcortical ischemic infarction // Residual RUE and RLE weakness  . History of depression    surrounding original stroke - has since resolved (04/2012)  . Hyperlipidemia   . Hypertension   . Internal carotid artery stenosis    BL and mild per CT angiogram (05/2010)  . Prediabetes   . Tobacco abuse     Patient Active Problem List   Diagnosis Date Noted  . Lumbar radiculopathy 10/01/2015  . Diabetes mellitus type 2, controlled ([place]) 07/03/2012  . Preventative health care 04/25/2012  . Tobacco abuse   . Hypertension   . Hyperlipidemia   . History of CVA (cerebrovascular accident) 02/06/2010    History reviewed. No pertinent surgical history.     Home Medications    Prior to Admission medications   Medication Sig Start Date End Date Taking? Authorizing Provider  amLODipine (NORVASC) 10 MG tablet TAKE 1 TABLET BY MOUTH DAILY 04/19/16   Ledell Noss, MD  aspirin 81 MG tablet Take 1 tablet (81 mg total) by mouth daily. 10/01/15   Corky Sox, MD  cloNIDine (CATAPRES) 0.2 MG tablet TAKE 1 TABLET BY MOUTH TWICE DAILY 06/09/16   Ledell Noss, MD  cloNIDine (CATAPRES) 0.2 MG tablet Take 1 tablet (0.2 mg total) by mouth 2 (two) times daily. 09/06/16   Mackuen, Courteney Lyn, MD  diphenhydrAMINE (BENADRYL) 25 mg capsule Take 1 capsule (25 mg total) by mouth at bedtime as needed for allergies or sleep. 07/03/12 07/03/13  Wilber Oliphant, MD  gabapentin (NEURONTIN) 300 MG capsule Take 2 capsules (600 mg total) by mouth 2 (two) times daily. 11/05/15   Rivet, Sindy Guadeloupe, MD  ibuprofen (ADVIL,MOTRIN) 600 MG tablet Take 1 tablet (600 mg total) by mouth every 8 (eight) hours as needed. 11/05/15   Rivet, Sindy Guadeloupe, MD  metFORMIN (GLUCOPHAGE) 1000 MG tablet TAKE 1 TABLET BY MOUTH TWICE DAILY WITH A MEAL 07/04/16   Ledell Noss, MD  metFORMIN (GLUCOPHAGE) 500 MG tablet Take 2 tablets (1,000 mg total) by mouth daily with breakfast. 09/06/16   Mackuen, Courteney Lyn, MD  methocarbamol (ROBAXIN) 500 MG tablet Take 1 tablet (500 mg total) by mouth 2 (two) times daily. 07/15/16   Larene Pickett, PA-C  metoprolol (LOPRESSOR) 100 MG tablet TAKE 1 AND 1/2 TABLETS BY MOUTH TWICE DAILY 05/17/16   Ledell Noss, MD  metoprolol (LOPRESSOR) 100 MG tablet Take 0.5 tablets (50 mg total) by mouth 2 (two) times daily. 09/06/16   Mackuen, Courteney Lyn, MD  naproxen (NAPROSYN) 500 MG tablet Take 1 tablet (500 mg total) by  mouth 2 (two) times daily with a meal. 07/15/16   Larene Pickett, PA-C  nicotine (NICODERM CQ - DOSED IN MG/24 HR) 7 mg/24hr patch Place 1 patch (7 mg total) onto the skin daily. 02/25/14   Wilber Oliphant, MD  pravastatin (PRAVACHOL) 20 MG tablet TAKE 1 TABLET BY MOUTH DAILY 04/19/16   Ledell Noss, MD    Family History Family History  Problem Relation Age of Onset  . Diabetes Mother   . Heart failure Mother   . Hypertension Mother   . Hyperlipidemia Mother   . Glaucoma Mother   . Lung cancer Father   . Hypertension Brother   . Diabetes Sister   . Arthritis Sister     Social History Social History   Tobacco Use  . Smoking status: Former Smoker     Packs/day: 0.30    Years: 31.00    Pack years: 9.30    Types: Cigarettes    Start date: 04/25/1981  . Smokeless tobacco: Never Used  . Tobacco comment: up to 3 cigs/day; cutting back.  Substance Use Topics  . Alcohol use: Yes    Comment: occasional  . Drug use: No     Allergies   Benicar [olmesartan]; Ace inhibitors; and Angiotensin receptor blockers   Review of Systems Review of Systems  HENT: Positive for sore throat.   Respiratory: Positive for cough.   All other systems reviewed and are negative.    Physical Exam Updated Vital Signs BP (!) 164/98 (BP Location: Right Arm)   Pulse 68   Temp 97.8 F (36.6 C) (Oral)   Resp 15   SpO2 98%   Physical Exam  Constitutional: He appears well-developed.  HENT:  Head: Normocephalic.  Mouth/Throat: Oropharynx is clear and moist and mucous membranes are normal.  Eyes: Pupils are equal, round, and reactive to light.  Neck: Normal range of motion.  Cardiovascular: Normal rate.  Pulmonary/Chest:  Diminished bilaterally, no wheezing   Abdominal: Soft.  Neurological: He is alert.  Skin: Skin is warm. Capillary refill takes less than 2 seconds.  Psychiatric: He has a normal mood and affect.  Nursing note and vitals reviewed.    ED Treatments / Results  Labs (all labs ordered are listed, but only abnormal results are displayed) Labs Reviewed - No data to display  EKG  EKG Interpretation None       Radiology Dg Chest 2 View  Result Date: 05/02/2017 CLINICAL DATA:  Cough for 1 week EXAM: CHEST  2 VIEW COMPARISON:  CT chest 03/01/2013 FINDINGS: The heart size and mediastinal contours are within normal limits. Both lungs are clear. The visualized skeletal structures are unremarkable. IMPRESSION: No active cardiopulmonary disease. Electronically Signed   By: Kathreen Devoid   On: 05/02/2017 17:41    Procedures Procedures (including critical care time)  Medications Ordered in ED Medications - No data to  display   Initial Impression / Assessment and Plan / ED Course  I have reviewed the triage vital signs and the nursing notes.  Pertinent labs & imaging results that were available during my care of the patient were reviewed by me and considered in my medical decision making (see chart for details).     Daniele Yankowski is a 56 y.o. male here with cough, sore throat. Likely bronchitis. OP clear, I doubt strep pharyngitis and he has low centor score. He was recently in prison but is well appearing. He is former smoker so will get zpack. He hasn't been taking his  metformin and BP meds and will refill them.    Final Clinical Impressions(s) / ED Diagnoses   Final diagnoses:  None    ED Discharge Orders    None       Drenda Freeze, MD 05/02/17 2054

## 2017-09-16 ENCOUNTER — Other Ambulatory Visit: Payer: Self-pay

## 2017-09-16 ENCOUNTER — Encounter (HOSPITAL_COMMUNITY): Payer: Self-pay | Admitting: Emergency Medicine

## 2017-09-16 ENCOUNTER — Emergency Department (HOSPITAL_COMMUNITY)
Admission: EM | Admit: 2017-09-16 | Discharge: 2017-09-16 | Disposition: A | Discharge status: Home / Self Care | Payer: Medicaid Other | Attending: Emergency Medicine | Admitting: Emergency Medicine

## 2017-09-16 DIAGNOSIS — R42 Dizziness and giddiness: Secondary | ICD-10-CM | POA: Diagnosis not present

## 2017-09-16 DIAGNOSIS — Z5321 Procedure and treatment not carried out due to patient leaving prior to being seen by health care provider: Secondary | ICD-10-CM | POA: Insufficient documentation

## 2017-09-16 LAB — CBG MONITORING, ED: GLUCOSE-CAPILLARY: 134 mg/dL — AB (ref 65–99)

## 2017-09-16 LAB — COMPREHENSIVE METABOLIC PANEL
ALK PHOS: 77 U/L (ref 38–126)
ALT: 55 U/L (ref 17–63)
ANION GAP: 13 (ref 5–15)
AST: 55 U/L — ABNORMAL HIGH (ref 15–41)
Albumin: 3.8 g/dL (ref 3.5–5.0)
BUN: 13 mg/dL (ref 6–20)
CALCIUM: 9.1 mg/dL (ref 8.9–10.3)
CHLORIDE: 105 mmol/L (ref 101–111)
CO2: 23 mmol/L (ref 22–32)
Creatinine, Ser: 1.24 mg/dL (ref 0.61–1.24)
GFR calc non Af Amer: >60 mL/min (ref >60)
Glucose, Bld: 137 mg/dL — ABNORMAL HIGH (ref 65–99)
Potassium: 3.4 mmol/L — ABNORMAL LOW (ref 3.5–5.1)
SODIUM: 141 mmol/L (ref 135–145)
Total Bilirubin: 0.6 mg/dL (ref 0.3–1.2)
Total Protein: 7.3 g/dL (ref 6.5–8.1)

## 2017-09-16 LAB — I-STAT TROPONIN, ED: TROPONIN I, POC: 0.07 ng/mL (ref 0.00–0.08)

## 2017-09-16 LAB — CBC
HCT: 39.1 % (ref 39.0–52.0)
Hemoglobin: 13.4 g/dL (ref 13.0–17.0)
MCH: 32.4 pg (ref 26.0–34.0)
MCHC: 34.3 g/dL (ref 30.0–36.0)
MCV: 94.4 fL (ref 78.0–100.0)
PLATELETS: 268 10*3/uL (ref 150–400)
RBC: 4.14 MIL/uL — ABNORMAL LOW (ref 4.22–5.81)
RDW: 14 % (ref 11.5–15.5)
WBC: 6.2 10*3/uL (ref 4.0–10.5)

## 2017-09-16 LAB — DIFFERENTIAL
BASOS PCT: 0 %
Basophils Absolute: 0 10*3/uL (ref 0.0–0.1)
EOS PCT: 2 %
Eosinophils Absolute: 0.1 10*3/uL (ref 0.0–0.7)
Lymphocytes Relative: 38 %
Lymphs Abs: 2.3 10*3/uL (ref 0.7–4.0)
MONO ABS: 0.4 10*3/uL (ref 0.1–1.0)
Monocytes Relative: 7 %
Neutro Abs: 3.3 10*3/uL (ref 1.7–7.7)
Neutrophils Relative %: 53 %

## 2017-09-16 NOTE — ED Notes (Signed)
No answer from pt in waiting room 

## 2017-09-16 NOTE — ED Triage Notes (Addendum)
Reports dizziness in the mornings when he wakes up for the past 2 weeks.  States it last for 2-3 hours.  States he has been off medications (diabetes, htn, high cholesterol) for 6 months.  No arm drift.

## 2017-09-16 NOTE — ED Notes (Signed)
No answer x3

## 2017-11-25 ENCOUNTER — Emergency Department (HOSPITAL_COMMUNITY)
Admission: EM | Admit: 2017-11-25 | Discharge: 2017-11-25 | Disposition: A | Discharge status: Home / Self Care | Payer: Medicaid Other | Attending: Emergency Medicine | Admitting: Emergency Medicine

## 2017-11-25 ENCOUNTER — Encounter (HOSPITAL_COMMUNITY): Payer: Self-pay | Admitting: Emergency Medicine

## 2017-11-25 ENCOUNTER — Emergency Department (HOSPITAL_COMMUNITY): Payer: Medicaid Other

## 2017-11-25 DIAGNOSIS — R319 Hematuria, unspecified: Secondary | ICD-10-CM | POA: Diagnosis not present

## 2017-11-25 DIAGNOSIS — Z7984 Long term (current) use of oral hypoglycemic drugs: Secondary | ICD-10-CM | POA: Diagnosis not present

## 2017-11-25 DIAGNOSIS — E119 Type 2 diabetes mellitus without complications: Secondary | ICD-10-CM | POA: Insufficient documentation

## 2017-11-25 DIAGNOSIS — Z79899 Other long term (current) drug therapy: Secondary | ICD-10-CM | POA: Insufficient documentation

## 2017-11-25 DIAGNOSIS — M6282 Rhabdomyolysis: Secondary | ICD-10-CM | POA: Diagnosis not present

## 2017-11-25 DIAGNOSIS — N281 Cyst of kidney, acquired: Secondary | ICD-10-CM | POA: Diagnosis not present

## 2017-11-25 DIAGNOSIS — F17210 Nicotine dependence, cigarettes, uncomplicated: Secondary | ICD-10-CM | POA: Diagnosis not present

## 2017-11-25 DIAGNOSIS — M7989 Other specified soft tissue disorders: Secondary | ICD-10-CM | POA: Diagnosis not present

## 2017-11-25 DIAGNOSIS — Z7982 Long term (current) use of aspirin: Secondary | ICD-10-CM | POA: Diagnosis not present

## 2017-11-25 DIAGNOSIS — I10 Essential (primary) hypertension: Secondary | ICD-10-CM | POA: Insufficient documentation

## 2017-11-25 HISTORY — DX: Unspecified viral hepatitis C without hepatic coma: B19.20

## 2017-11-25 LAB — COMPREHENSIVE METABOLIC PANEL
ALT: 38 U/L (ref 0–44)
AST: 50 U/L — AB (ref 15–41)
Albumin: 3.8 g/dL (ref 3.5–5.0)
Alkaline Phosphatase: 64 U/L (ref 38–126)
Anion gap: 12 (ref 5–15)
BILIRUBIN TOTAL: 0.8 mg/dL (ref 0.3–1.2)
BUN: 9 mg/dL (ref 6–20)
CO2: 24 mmol/L (ref 22–32)
CREATININE: 0.85 mg/dL (ref 0.61–1.24)
Calcium: 8.4 mg/dL — ABNORMAL LOW (ref 8.9–10.3)
Chloride: 107 mmol/L (ref 98–111)
GFR calc Af Amer: >60 mL/min (ref >60)
Glucose, Bld: 117 mg/dL — ABNORMAL HIGH (ref 70–99)
POTASSIUM: 3.5 mmol/L (ref 3.5–5.1)
Sodium: 143 mmol/L (ref 135–145)
TOTAL PROTEIN: 7.2 g/dL (ref 6.5–8.1)

## 2017-11-25 LAB — URINALYSIS, ROUTINE W REFLEX MICROSCOPIC
BACTERIA UA: NONE SEEN
BILIRUBIN URINE: NEGATIVE
Glucose, UA: NEGATIVE mg/dL
KETONES UR: NEGATIVE mg/dL
LEUKOCYTES UA: NEGATIVE
Nitrite: NEGATIVE
Protein, ur: 30 mg/dL — AB
SPECIFIC GRAVITY, URINE: 1.03 (ref 1.005–1.030)
pH: 5 (ref 5.0–8.0)

## 2017-11-25 LAB — CBC
HCT: 38.8 % — ABNORMAL LOW (ref 39.0–52.0)
Hemoglobin: 13 g/dL (ref 13.0–17.0)
MCH: 31.2 pg (ref 26.0–34.0)
MCHC: 33.5 g/dL (ref 30.0–36.0)
MCV: 93 fL (ref 78.0–100.0)
PLATELETS: 209 10*3/uL (ref 150–400)
RBC: 4.17 MIL/uL — ABNORMAL LOW (ref 4.22–5.81)
RDW: 12.8 % (ref 11.5–15.5)
WBC: 5.2 10*3/uL (ref 4.0–10.5)

## 2017-11-25 LAB — RAPID URINE DRUG SCREEN, HOSP PERFORMED
Amphetamines: NOT DETECTED
Barbiturates: NOT DETECTED
Benzodiazepines: NOT DETECTED
COCAINE: POSITIVE — AB
OPIATES: NOT DETECTED
Tetrahydrocannabinol: NOT DETECTED

## 2017-11-25 LAB — CK: Total CK: 1100 U/L — ABNORMAL HIGH (ref 49–397)

## 2017-11-25 MED ORDER — SODIUM CHLORIDE 0.9 % IV BOLUS
1000.0000 mL | Freq: Once | INTRAVENOUS | Status: AC
  Administered 2017-11-25: 1000 mL via INTRAVENOUS

## 2017-11-25 MED ORDER — SODIUM CHLORIDE 0.9 % IV BOLUS
1000.0000 mL | Freq: Once | INTRAVENOUS | Status: AC
Start: 2017-11-25 — End: 2017-11-25
  Administered 2017-11-25: 1000 mL via INTRAVENOUS

## 2017-11-25 NOTE — ED Notes (Signed)
Discharge instructions discussed with Pt. Pt verbalized understanding. Pt stable and ambulatory.    

## 2017-11-25 NOTE — ED Provider Notes (Signed)
[facility] EMERGENCY DEPARTMENT Provider Note   CSN: [id number] Arrival date & time: 11/25/17  6195   History   Chief Complaint Chief Complaint  Patient presents with  . Hematuria    HPI Ronald Marsh is a 57 y.o. male with a medical history of HTN, CVA (with residual right-sided weakness), DM, and untreated hepatitis C who presents for one episode of hematuria that occurred today. He describes it as one drop of blood in his urine, but can't say if the blood was early or late in his urine stream. He has never had anything like this before. He denies fevers, abdominal pain, flank pain, dysuria, and difficulty initiating his urine stream. He reports some muscle fatigue and swelling in the hands and feet, but he has had these symptoms since his strokes in 2012. Denies trauma to the penis or urinary sediment. He states that he walks a lot, but doesn't exercise regularly. He is a current cigarette smoker and has smoked 2-3 cigarettes per day for 30 years. He also uses crack cocaine, which he last used last night. Since his release from jail, he has been drinking 4-5 drinks per day. He was released from prison 3 days ago after a 45 day stay.   Past Medical History:  Diagnosis Date  . Diabetes mellitus without complication ([place])   . Hepatitis C   . History of CVA (cerebrovascular accident) 02/2010   Left periventricular subcortical ischemic infarction // Residual RUE and RLE weakness  . History of depression    surrounding original stroke - has since resolved (04/2012)  . Hyperlipidemia   . Hypertension   . Internal carotid artery stenosis    BL and mild per CT angiogram (05/2010)  . Prediabetes   . Tobacco abuse     Patient Active Problem List   Diagnosis Date Noted  . Lumbar radiculopathy 10/01/2015  . Diabetes mellitus type 2, controlled ([place]) 07/03/2012  . Preventative health care 04/25/2012  . Tobacco abuse   . Hypertension   . Hyperlipidemia   . History of  CVA (cerebrovascular accident) 02/06/2010    History reviewed. No pertinent surgical history.     Home Medications    Prior to Admission medications   Medication Sig Start Date End Date Taking? Authorizing Provider  amLODipine (NORVASC) 10 MG tablet Take 1 tablet (10 mg total) by mouth daily. 05/02/17   Drenda Freeze, MD  aspirin 81 MG tablet Take 1 tablet (81 mg total) by mouth daily. 10/01/15   Corky Sox, MD  azithromycin (ZITHROMAX Z-PAK) 250 MG tablet 2 po day one, then 1 daily x 4 days 05/02/17   Drenda Freeze, MD  cloNIDine (CATAPRES) 0.2 MG tablet TAKE 1 TABLET BY MOUTH TWICE DAILY 06/09/16   Ledell Noss, MD  cloNIDine (CATAPRES) 0.2 MG tablet Take 1 tablet (0.2 mg total) by mouth 2 (two) times daily. 09/06/16   Mackuen, Courteney Lyn, MD  diphenhydrAMINE (BENADRYL) 25 mg capsule Take 1 capsule (25 mg total) by mouth at bedtime as needed for allergies or sleep. 07/03/12 07/03/13  Wilber Oliphant, MD  gabapentin (NEURONTIN) 300 MG capsule Take 2 capsules (600 mg total) by mouth 2 (two) times daily. 11/05/15   Rivet, Sindy Guadeloupe, MD  ibuprofen (ADVIL,MOTRIN) 600 MG tablet Take 1 tablet (600 mg total) by mouth every 8 (eight) hours as needed. 11/05/15   Rivet, Sindy Guadeloupe, MD  metFORMIN (GLUCOPHAGE) 500 MG tablet Take 1 tablet (500 mg total) by mouth 2 (two)  times daily with a meal. 05/02/17   Drenda Freeze, MD  methocarbamol (ROBAXIN) 500 MG tablet Take 1 tablet (500 mg total) by mouth 2 (two) times daily. 07/15/16   Larene Pickett, PA-C  metoprolol tartrate (LOPRESSOR) 50 MG tablet Take 1 tablet (50 mg total) by mouth 2 (two) times daily. 05/02/17   Drenda Freeze, MD  naproxen (NAPROSYN) 500 MG tablet Take 1 tablet (500 mg total) by mouth 2 (two) times daily with a meal. 07/15/16   Larene Pickett, PA-C  nicotine (NICODERM CQ - DOSED IN MG/24 HR) 7 mg/24hr patch Place 1 patch (7 mg total) onto the skin daily. 02/25/14   Wilber Oliphant, MD  pravastatin (PRAVACHOL) 20 MG tablet  TAKE 1 TABLET BY MOUTH DAILY 04/19/16   Ledell Noss, MD    Family History Family History  Problem Relation Age of Onset  . Diabetes Mother   . Heart failure Mother   . Hypertension Mother   . Hyperlipidemia Mother   . Glaucoma Mother   . Lung cancer Father   . Hypertension Brother   . Diabetes Sister   . Arthritis Sister     Social History Social History   Tobacco Use  . Smoking status: Current Some Day Smoker    Packs/day: 0.30    Years: 31.00    Pack years: 9.30    Types: Cigarettes    Start date: 04/25/1981  . Smokeless tobacco: Never Used  . Tobacco comment: up to 3 cigs/day; cutting back.  Substance Use Topics  . Alcohol use: Yes    Comment: occasional  . Drug use: No     Allergies   Benicar [olmesartan]; Ace inhibitors; and Angiotensin receptor blockers   Review of Systems Review of Systems  Constitutional: Negative for chills and fever.  HENT: Negative for rhinorrhea and sore throat.   Respiratory: Negative for cough and shortness of breath.   Cardiovascular: Positive for leg swelling. Negative for chest pain.  Gastrointestinal: Negative for abdominal pain, diarrhea, nausea and vomiting.  Genitourinary: Positive for hematuria. Negative for difficulty urinating, discharge, dysuria, flank pain and urgency.  Musculoskeletal: Negative for back pain and myalgias.  Skin: Negative for rash and wound.  Neurological: Negative for weakness and headaches.     Physical Exam Updated Vital Signs BP 139/85   Pulse 72   Temp 98.5 F (36.9 C) (Oral)   Resp 17   SpO2 96%   Physical Exam  Constitutional: He is oriented to person, place, and time. He appears well-developed and well-nourished. No distress.  HENT:  Head: Normocephalic and atraumatic.  Eyes: Pupils are equal, round, and reactive to light. EOM are normal.  Neck: Normal range of motion.  Cardiovascular: Normal rate and regular rhythm. Exam reveals no gallop and no friction rub.  No murmur  heard. Pulmonary/Chest: Effort normal and breath sounds normal. He has no wheezes. He has no rales.  Abdominal: Soft. He exhibits no distension. There is no tenderness.  Musculoskeletal: Normal range of motion.  Trace edema in the lower extremities. No appreciable edema in the hands.  Neurological: He is alert and oriented to person, place, and time.  Skin: Skin is warm and dry. Capillary refill takes less than 2 seconds.  Psychiatric: He has a normal mood and affect. His behavior is normal.     ED Treatments / Results  Labs (all labs ordered are listed, but only abnormal results are displayed) Labs Reviewed  URINALYSIS, ROUTINE W REFLEX MICROSCOPIC - Abnormal; Notable  for the following components:      Result Value   APPearance HAZY (*)    Hgb urine dipstick SMALL (*)    Protein, ur 30 (*)    All other components within normal limits  COMPREHENSIVE METABOLIC PANEL - Abnormal; Notable for the following components:   Glucose, Bld 117 (*)    Calcium 8.4 (*)    AST 50 (*)    All other components within normal limits  CBC - Abnormal; Notable for the following components:   RBC 4.17 (*)    HCT 38.8 (*)    All other components within normal limits  CK - Abnormal; Notable for the following components:   Total CK 1,100 (*)    All other components within normal limits  RAPID URINE DRUG SCREEN, HOSP PERFORMED - Abnormal; Notable for the following components:   Cocaine POSITIVE (*)    All other components within normal limits  URINE CULTURE    EKG None  Radiology Ct Renal Stone Study  Result Date: 11/25/2017 CLINICAL DATA:  Hematuria beginning this morning. EXAM: CT ABDOMEN AND PELVIS WITHOUT CONTRAST TECHNIQUE: Multidetector CT imaging of the abdomen and pelvis was performed following the standard protocol without IV contrast. COMPARISON:  None. FINDINGS: Lower chest: No acute abnormality. Calcified plaque over the left lateral circumflex and right coronary arteries. Hepatobiliary:  Normal. Pancreas: Normal. Spleen: Normal. Adrenals/Urinary Tract: Adrenal glands are normal. Kidneys are normal size without hydronephrosis or nephrolithiasis. 1 cm cyst over the mid pole right kidney. Ureters are normal. Bladder is somewhat decompressed with mild diffuse wall thickening. Stomach/Bowel: Stomach and small bowel are normal. Appendix is normal. Colon is within normal with mild fecal retention. Vascular/Lymphatic: Mild calcified plaque over the abdominal aorta and iliac arteries. No adenopathy. Reproductive: Normal. Other: No free fluid or focal inflammatory change. Musculoskeletal: Degenerate change of the spine and hips. IMPRESSION: No evidence of nephroureterolithiasis or obstruction. No acute findings in the abdomen/pelvis. Mild bladder wall thickening which non-specific, but may be due to cystitis. 1 cm right renal cyst. Aortic Atherosclerosis (ICD10-I70.0). Atherosclerotic coronary artery disease. Electronically Signed   By: Marin Olp M.D.   On: 11/25/2017 12:05    Procedures Procedures (including critical care time)  Medications Ordered in ED Medications  sodium chloride 0.9 % bolus 1,000 mL (0 mLs Intravenous Stopped 11/25/17 1537)  sodium chloride 0.9 % bolus 1,000 mL (0 mLs Intravenous Stopped 11/25/17 1537)     Initial Impression / Assessment and Plan / ED Course  I have reviewed the triage vital signs and the nursing notes.  Pertinent labs & imaging results that were available during my care of the patient were reviewed by me and considered in my medical decision making (see chart for details).  Ronald Marsh is a 57 y.o. male with a medical history of HTN, CVA (with residual right-sided weakness), DM, and untreated hepatitis C who presents for one episode of hematuria without abdominal pain, flank pain, or dysuria. Upon arrival to the ED, he was afebrile and hypertensive to 186/106. Physical exam was unremarkable. DDx includes UTI, rhabdomyolysis given recent crack use,  renal or bladder mass given smoking history, and glomerulonephritis in the setting of Hep C. Less concern for nephrolithiasis because he is pain free. UA showed small Hb, 0-5 RBCs, and 30 protein. Labs show AST 50 and CK 1,100. UA shows small hgb with 0-5 RBCs and 30 protein. UDS positive for cocaine. Renal CT negative for stone, obstruction, or mass. Pt treated for rhabdomyolysis with 2L  NS. Pt medically safe for discharge given normal kidney function. Advised patient to return to the ED with severe muscle fatigue, worsening hematuria, or any other new or concerning symptoms. Advised patient to hydrate, avoid strenuous activity, avoid alcohol and drug use, and to f/u with both a PCP and hepatologist for evaluation of his hep c.   Final Clinical Impressions(s) / ED Diagnoses   Final diagnoses:  Non-traumatic rhabdomyolysis    ED Discharge Orders    None       Essance Gatti, Andree Elk, MD 11/25/17 1547    Isla Pence, MD 11/25/17 1606

## 2017-11-25 NOTE — Discharge Instructions (Addendum)
You have rhabdomyolysis, which is caused by a breakdown of your muscle tissue and results in blood in your urine. The treatment for this is hydration and you received 2 liters of fluid in the ED. Keep hydrating at home and avoid strenuous exercise. Avoid any substance use. Return to the ED if you have muscle soreness or weakness, profound blood in your urine, or any other new or concerning symptoms. Please establish care with a primary care physician (number provided) and see a gastroenterologist (number provided for your liver disease).

## 2017-11-25 NOTE — ED Triage Notes (Signed)
Patient complains of hematuria that started this morning. Denies other complaints. Patient alert, oriented, and ambulating independently with steady gait.

## 2017-11-26 LAB — URINE CULTURE: SPECIAL REQUESTS: NORMAL

## 2018-01-30 DIAGNOSIS — R079 Chest pain, unspecified: Secondary | ICD-10-CM | POA: Diagnosis not present

## 2018-01-30 DIAGNOSIS — R42 Dizziness and giddiness: Secondary | ICD-10-CM | POA: Diagnosis not present

## 2018-01-30 DIAGNOSIS — G459 Transient cerebral ischemic attack, unspecified: Secondary | ICD-10-CM | POA: Diagnosis not present

## 2018-01-30 DIAGNOSIS — R531 Weakness: Secondary | ICD-10-CM | POA: Diagnosis not present

## 2018-01-30 DIAGNOSIS — R0602 Shortness of breath: Secondary | ICD-10-CM | POA: Diagnosis not present

## 2018-01-30 DIAGNOSIS — I368 Other nonrheumatic tricuspid valve disorders: Secondary | ICD-10-CM | POA: Diagnosis not present

## 2018-01-30 DIAGNOSIS — I348 Other nonrheumatic mitral valve disorders: Secondary | ICD-10-CM | POA: Diagnosis not present

## 2018-01-30 DIAGNOSIS — R51 Headache: Secondary | ICD-10-CM | POA: Diagnosis not present

## 2018-01-30 DIAGNOSIS — I350 Nonrheumatic aortic (valve) stenosis: Secondary | ICD-10-CM | POA: Diagnosis not present

## 2018-01-30 DIAGNOSIS — I6523 Occlusion and stenosis of bilateral carotid arteries: Secondary | ICD-10-CM | POA: Diagnosis not present

## 2018-01-31 DIAGNOSIS — I420 Dilated cardiomyopathy: Secondary | ICD-10-CM | POA: Diagnosis not present

## 2018-01-31 DIAGNOSIS — I368 Other nonrheumatic tricuspid valve disorders: Secondary | ICD-10-CM | POA: Diagnosis not present

## 2018-01-31 DIAGNOSIS — I350 Nonrheumatic aortic (valve) stenosis: Secondary | ICD-10-CM | POA: Diagnosis not present

## 2018-01-31 DIAGNOSIS — I429 Cardiomyopathy, unspecified: Secondary | ICD-10-CM | POA: Insufficient documentation

## 2018-01-31 DIAGNOSIS — G459 Transient cerebral ischemic attack, unspecified: Secondary | ICD-10-CM | POA: Diagnosis not present

## 2018-01-31 DIAGNOSIS — I348 Other nonrheumatic mitral valve disorders: Secondary | ICD-10-CM | POA: Diagnosis not present

## 2018-02-01 DIAGNOSIS — I428 Other cardiomyopathies: Secondary | ICD-10-CM | POA: Diagnosis not present

## 2018-02-01 DIAGNOSIS — G459 Transient cerebral ischemic attack, unspecified: Secondary | ICD-10-CM | POA: Diagnosis not present

## 2018-02-01 DIAGNOSIS — I420 Dilated cardiomyopathy: Secondary | ICD-10-CM | POA: Diagnosis not present

## 2018-02-02 DIAGNOSIS — G459 Transient cerebral ischemic attack, unspecified: Secondary | ICD-10-CM | POA: Diagnosis not present

## 2018-02-02 DIAGNOSIS — I420 Dilated cardiomyopathy: Secondary | ICD-10-CM | POA: Diagnosis not present

## 2018-02-03 ENCOUNTER — Emergency Department (HOSPITAL_COMMUNITY)
Admission: EM | Admit: 2018-02-03 | Discharge: 2018-02-03 | Disposition: A | Discharge status: Home / Self Care | Payer: Medicaid Other | Attending: Emergency Medicine | Admitting: Emergency Medicine

## 2018-02-03 ENCOUNTER — Emergency Department (HOSPITAL_COMMUNITY): Payer: Medicaid Other

## 2018-02-03 ENCOUNTER — Other Ambulatory Visit: Payer: Self-pay

## 2018-02-03 ENCOUNTER — Encounter (HOSPITAL_COMMUNITY): Payer: Self-pay | Admitting: Emergency Medicine

## 2018-02-03 DIAGNOSIS — R0789 Other chest pain: Secondary | ICD-10-CM | POA: Diagnosis not present

## 2018-02-03 DIAGNOSIS — F17210 Nicotine dependence, cigarettes, uncomplicated: Secondary | ICD-10-CM | POA: Diagnosis not present

## 2018-02-03 DIAGNOSIS — Z7984 Long term (current) use of oral hypoglycemic drugs: Secondary | ICD-10-CM | POA: Insufficient documentation

## 2018-02-03 DIAGNOSIS — Z79899 Other long term (current) drug therapy: Secondary | ICD-10-CM | POA: Insufficient documentation

## 2018-02-03 DIAGNOSIS — R0602 Shortness of breath: Secondary | ICD-10-CM | POA: Diagnosis not present

## 2018-02-03 DIAGNOSIS — E119 Type 2 diabetes mellitus without complications: Secondary | ICD-10-CM | POA: Diagnosis not present

## 2018-02-03 DIAGNOSIS — I10 Essential (primary) hypertension: Secondary | ICD-10-CM | POA: Diagnosis not present

## 2018-02-03 DIAGNOSIS — Z7982 Long term (current) use of aspirin: Secondary | ICD-10-CM | POA: Diagnosis not present

## 2018-02-03 LAB — BASIC METABOLIC PANEL
Anion gap: 10 (ref 5–15)
BUN: 10 mg/dL (ref 6–20)
CALCIUM: 9.6 mg/dL (ref 8.9–10.3)
CHLORIDE: 103 mmol/L (ref 98–111)
CO2: 24 mmol/L (ref 22–32)
CREATININE: 1.06 mg/dL (ref 0.61–1.24)
GFR calc Af Amer: >60 mL/min (ref >60)
GFR calc non Af Amer: >60 mL/min (ref >60)
GLUCOSE: 178 mg/dL — AB (ref 70–99)
Potassium: 3.5 mmol/L (ref 3.5–5.1)
Sodium: 137 mmol/L (ref 135–145)

## 2018-02-03 LAB — CBC
HCT: 39.4 % (ref 39.0–52.0)
HEMOGLOBIN: 13.3 g/dL (ref 13.0–17.0)
MCH: 31.7 pg (ref 26.0–34.0)
MCHC: 33.8 g/dL (ref 30.0–36.0)
MCV: 94 fL (ref 78.0–100.0)
PLATELETS: 268 10*3/uL (ref 150–400)
RBC: 4.19 MIL/uL — ABNORMAL LOW (ref 4.22–5.81)
RDW: 13.3 % (ref 11.5–15.5)
WBC: 7.4 10*3/uL (ref 4.0–10.5)

## 2018-02-03 LAB — I-STAT TROPONIN, ED
TROPONIN I, POC: 0.07 ng/mL (ref 0.00–0.08)
TROPONIN I, POC: 0.08 ng/mL (ref 0.00–0.08)
Troponin i, poc: 0.07 ng/mL (ref 0.00–0.08)

## 2018-02-03 LAB — BRAIN NATRIURETIC PEPTIDE: B Natriuretic Peptide: 152.3 pg/mL — ABNORMAL HIGH (ref 0.0–100.0)

## 2018-02-03 MED ORDER — ASPIRIN 81 MG PO CHEW
324.0000 mg | CHEWABLE_TABLET | Freq: Once | ORAL | Status: AC
  Administered 2018-02-03: 324 mg via ORAL
  Filled 2018-02-03: qty 4

## 2018-02-03 NOTE — ED Triage Notes (Signed)
C/o tightness to center of chest and SOB x 1 hour.  States he was seen this past week in [place] and had cardiac cath.

## 2018-02-03 NOTE — ED Provider Notes (Signed)
TIME SEEN: 2:25 AM  CHIEF COMPLAINT: Chest pain  HPI: Patient is a 57 year old male with history of hypertension, diabetes, hyperlipidemia, nonobstructive coronary artery disease, cardiomyopathy who presents the emergency department with chest tightness that has been intermittent for the past year.  Has also felt intermittently short of breath.  States chest pain is worse when he tries to take a deep breath and when he moves.  He underwent cardiac catheterization at [facility] on September 26 through the right wrist.  No stents placed at that time.  No lower extremity swelling or pain.  No history of PE or DVT.   02/01/18 Cardiac Cath at [facility]:  "FINAL CARDIAC CATHETERIZATION REPORT  Impressions: 1. Global hypokinesis with ejection fraction approximately 30%.Please  note that and a post PVC beat ejection fraction was near normal. 2. Branch, ectatic, and nonobstructive coronary disease 3. 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 Medical therapy.The fact that the ejection fraction looks much better  and a post PVC beat is encouraging that medical therapy should have a high  chance of success   Attending: Sharalyn Ink, MD, [facility], [facility]  Referring: Dr. for Nira Retort  Anesthesia:Local with conscious sedation.  Patient's response to sedation:Patient assessed post sedation/anesthesia  and tolerated well.Vital signs stable and no complications noted.  Access: Right radial artery, antecubital vein  Blood Loss:Minimal.  Procedures Performed: Right left heart catheterization left  ventriculography coronary arteriography  Indication:Patient presents with a history of increasing symptoms of  dyspnea and chest tightness.Echo showed LV dysfunction.Catheter  sedation scheduled for evaluation   FINDINGS:   Hemodynamics: Aortic pressure: 109/58  LVEDP: 13.Right atrial pressure mean of 6.Pulmonary artery pressure  33/18  mean of 33.Pulmonary wedge mean of 20.Estimated Fick cardiac  output 4.82, index 2.19.Pulmonary vascular resistance 0.62 Wood units. Systemic vascular resistance 9.54 Wood units. Aortic Stenosis no   Left Ventriculogram: Left ventricle is normal in size and shape.There is global hypokinesis  with visually estimated ejection fraction approximately 30%.There were  PVCs during the ventriculogram and the ejection fraction on a post-PVC  beat" looks to be near normal.There is mild mitral regurgitation  Coronary Angiography: Dominance: Right  There is moderate calcium in all vessels  Left Main: Left main has mild calcium, but no significant lesion  Left Anterior Descending: Left anterior descending has mild calcium. There is some ectasia and luminal disease throughout the course of the  vessel.Each of the diagonals have similar findings.There is a small  tiny branches of the diagonal that are diffusely disease.   Left Circumflex: Summary the circumflex has calcium and ectasia.There is  some mild narrowing and luminal disease, but no flow-limiting lesion. Some small branches of the marginal has diffuse disease  Right Coronary Artery: Summary the right coronary artery is dominant with  calcium.There is ectasia and luminal disease but again no severe lesions  Technique: The wrist was prepared with Chloroprep Sterilization and lidocaine  anesthesia.The radial artery was punctured using Seldinger technique. Sheath was placed.Catheterization and procedure were performed through  this sheath  Indwelling right antecubital IV was exchanged for a 5 French sheath and  right heart catheterization performed through the sheath Medicines during case  IV heparin, fentanyl, versed.Intra-arterial verapamil  Catheters used  5 [place] Swan-Ganz, 6 French pigtail, him radial, JL 3.5   Complications:   None   Hemostasis:   Manual pressure and TR  band   Stephens Shire., MD, [facility], FSCAI 02/01/2018, 11:07 AM"  ROS: See HPI Constitutional: no fever  Eyes: no drainage  ENT: no runny nose   Cardiovascular:  no chest pain  Resp: no SOB  GI: no vomiting GU: no dysuria Integumentary: no rash  Allergy: no hives  Musculoskeletal: no leg swelling  Neurological: no slurred speech ROS otherwise negative  PAST MEDICAL HISTORY/PAST SURGICAL HISTORY:  Past Medical History:  Diagnosis Date  . Diabetes mellitus without complication ([place])   . Hepatitis C   . History of CVA (cerebrovascular accident) 02/2010   Left periventricular subcortical ischemic infarction // Residual RUE and RLE weakness  . History of depression    surrounding original stroke - has since resolved (04/2012)  . Hyperlipidemia   . Hypertension   . Internal carotid artery stenosis    BL and mild per CT angiogram (05/2010)  . Prediabetes   . Tobacco abuse     MEDICATIONS:  Prior to Admission medications   Medication Sig Start Date End Date Taking? Authorizing Provider  amLODipine (NORVASC) 10 MG tablet Take 1 tablet (10 mg total) by mouth daily. 05/02/17   Drenda Freeze, MD  aspirin 81 MG tablet Take 1 tablet (81 mg total) by mouth daily. 10/01/15   Corky Sox, MD  azithromycin (ZITHROMAX Z-PAK) 250 MG tablet 2 po day one, then 1 daily x 4 days 05/02/17   Drenda Freeze, MD  cloNIDine (CATAPRES) 0.2 MG tablet TAKE 1 TABLET BY MOUTH TWICE DAILY 06/09/16   Ledell Noss, MD  cloNIDine (CATAPRES) 0.2 MG tablet Take 1 tablet (0.2 mg total) by mouth 2 (two) times daily. 09/06/16   Mackuen, Courteney Lyn, MD  diphenhydrAMINE (BENADRYL) 25 mg capsule Take 1 capsule (25 mg total) by mouth at bedtime as needed for allergies or sleep. 07/03/12 07/03/13  Wilber Oliphant, MD  gabapentin (NEURONTIN) 300 MG capsule Take 2 capsules (600 mg total) by mouth 2 (two) times daily. 11/05/15   Rivet, Sindy Guadeloupe, MD  ibuprofen (ADVIL,MOTRIN) 600 MG tablet Take 1 tablet (600 mg  total) by mouth every 8 (eight) hours as needed. 11/05/15   Rivet, Sindy Guadeloupe, MD  metFORMIN (GLUCOPHAGE) 500 MG tablet Take 1 tablet (500 mg total) by mouth 2 (two) times daily with a meal. 05/02/17   Drenda Freeze, MD  methocarbamol (ROBAXIN) 500 MG tablet Take 1 tablet (500 mg total) by mouth 2 (two) times daily. 07/15/16   Larene Pickett, PA-C  metoprolol tartrate (LOPRESSOR) 50 MG tablet Take 1 tablet (50 mg total) by mouth 2 (two) times daily. 05/02/17   Drenda Freeze, MD  naproxen (NAPROSYN) 500 MG tablet Take 1 tablet (500 mg total) by mouth 2 (two) times daily with a meal. 07/15/16   Larene Pickett, PA-C  nicotine (NICODERM CQ - DOSED IN MG/24 HR) 7 mg/24hr patch Place 1 patch (7 mg total) onto the skin daily. 02/25/14   Wilber Oliphant, MD  pravastatin (PRAVACHOL) 20 MG tablet TAKE 1 TABLET BY MOUTH DAILY 04/19/16   Ledell Noss, MD    ALLERGIES:  Allergies  Allergen Reactions  . Benicar [Olmesartan] Anaphylaxis and Swelling  . Ace Inhibitors Swelling  . Angiotensin Receptor Blockers Swelling    SOCIAL HISTORY:  Social History   Tobacco Use  . Smoking status: Current Some Day Smoker    Packs/day: 0.30    Years: 31.00    Pack years: 9.30    Types: Cigarettes    Start date: 04/25/1981  . Smokeless tobacco: Never Used  . Tobacco comment:  up to 3 cigs/day; cutting back.  Substance Use Topics  . Alcohol use: Yes    Comment: occasional    FAMILY HISTORY: Family History  Problem Relation Age of Onset  . Diabetes Mother   . Heart failure Mother   . Hypertension Mother   . Hyperlipidemia Mother   . Glaucoma Mother   . Lung cancer Father   . Hypertension Brother   . Diabetes Sister   . Arthritis Sister     EXAM: BP (!) 149/101   Pulse 82   Temp 97.8 F (36.6 C) (Oral)   Resp (!) 23   SpO2 100%  CONSTITUTIONAL: Alert and oriented and responds appropriately to questions. Well-appearing; well-nourished HEAD: Normocephalic EYES: Conjunctivae clear, pupils  appear equal, EOMI ENT: normal nose; moist mucous membranes NECK: Supple, no meningismus, no nuchal rigidity, no LAD  CARD: RRR; S1 and S2 appreciated; no murmurs, no clicks, no rubs, no gallops RESP: Normal chest excursion without splinting or tachypnea; breath sounds clear and equal bilaterally; no wheezes, no rhonchi, no rales, no hypoxia or respiratory distress, speaking full sentences ABD/GI: Normal bowel sounds; non-distended; soft, non-tender, no rebound, no guarding, no peritoneal signs, no hepatosplenomegaly BACK:  The back appears normal and is non-tender to palpation, there is no CVA tenderness EXT: Normal ROM in all joints; non-tender to palpation; no edema; normal capillary refill; no cyanosis, no calf tenderness or swelling    SKIN: Normal color for age and race; warm; no rash NEURO: Moves all extremities equally PSYCH: The patient's mood and manner are appropriate. Grooming and personal hygiene are appropriate.  MEDICAL DECISION MAKING: Here with atypical chest pain.  Seems to be musculoskeletal in nature as it is worse with movement.  First troponin negative.  EKG shows lateral ischemic changes that do appear slightly worse compared to our previous EKG but he just underwent catheterization 2 days ago that showed nonobstructive disease.  Please see cath report above.  No sign of volume overload on exam.  Doubt PE or dissection.  Plan is to repeat second troponin in 3 hours.  If this is negative, patient will be discharged.  States he has follow-up in [place].  ED PROGRESS: Patient's second troponin is negative.  BNP is 152.  Obtain this so that we would have a baseline.  Does not appear volume overloaded today and chest x-ray is clear.  Patient will follow-up with his cardiologist as an outpatient.  Again the seems to be very atypical chest pain and he is asymptomatic currently.  Will discharge home.   At this time, I do not feel there is any life-threatening condition present. I have  reviewed and discussed all results (EKG, imaging, lab, urine as appropriate) and exam findings with patient/family. I have reviewed nursing notes and appropriate previous records.  I feel the patient is safe to be discharged home without further emergent workup and can continue workup as an outpatient as needed. Discussed usual and customary return precautions. Patient/family verbalize understanding and are comfortable with this plan.  Outpatient follow-up has been provided if needed. All questions have been answered.      EKG Interpretation  Date/Time:  Saturday February 03 2018 00:45:58 EDT Ventricular Rate:  90 PR Interval:  184 QRS Duration: 100 QT Interval:  382 QTC Calculation: 467 R Axis:   -38 Text Interpretation:  Sinus rhythm with frequent Premature ventricular complexes Left axis deviation Moderate voltage criteria for LVH, may be normal variant Cannot rule out Septal infarct , age undetermined T  wave abnormality, consider lateral ischemia that appears worse than previous Abnormal ECG Confirmed by Veer Elamin, Cyril Mourning [phone]) on 02/03/2018 1:35:36 AM         Nickie Warwick, Delice Bison, DO 02/03/18 [id number]

## 2018-04-03 ENCOUNTER — Encounter (HOSPITAL_COMMUNITY): Payer: Self-pay

## 2018-04-03 ENCOUNTER — Ambulatory Visit (HOSPITAL_COMMUNITY)
Admission: EM | Admit: 2018-04-03 | Discharge: 2018-04-03 | Disposition: A | Discharge status: Home / Self Care | Payer: Medicaid Other | Attending: Family Medicine | Admitting: Family Medicine

## 2018-04-03 ENCOUNTER — Ambulatory Visit (INDEPENDENT_AMBULATORY_CARE_PROVIDER_SITE_OTHER): Payer: Medicaid Other

## 2018-04-03 DIAGNOSIS — R05 Cough: Secondary | ICD-10-CM | POA: Diagnosis not present

## 2018-04-03 DIAGNOSIS — J22 Unspecified acute lower respiratory infection: Secondary | ICD-10-CM

## 2018-04-03 DIAGNOSIS — I10 Essential (primary) hypertension: Secondary | ICD-10-CM | POA: Diagnosis not present

## 2018-04-03 MED ORDER — BENZONATATE 200 MG PO CAPS: 200.0000 mg | ORAL_CAPSULE | Freq: Three times a day (TID) | ORAL | 0 refills | Status: AC | PRN

## 2018-04-03 MED ORDER — PREDNISONE 50 MG PO TABS: 50.0000 mg | ORAL_TABLET | Freq: Every day | ORAL | 0 refills | Status: AC

## 2018-04-03 MED ORDER — AZITHROMYCIN 250 MG PO TABS: 250.0000 mg | ORAL_TABLET | Freq: Every day | ORAL | 0 refills | Status: DC

## 2018-04-03 MED ORDER — CETIRIZINE HCL 10 MG PO CAPS: 10.0000 mg | ORAL_CAPSULE | Freq: Every day | ORAL | 0 refills | Status: DC

## 2018-04-03 NOTE — ED Triage Notes (Signed)
Pt presents with persistent cough, congestion and generalized body aches.

## 2018-04-03 NOTE — ED Provider Notes (Signed)
[place]    CSN: [id number] Arrival date & time: 04/03/18  1315     History   Chief Complaint No chief complaint on file.   HPI Ronald Marsh is a 57 y.o. male history of DM type II, previous CVA, recent CHF diagnosis, hypertension, hyperlipidemia, tobacco use presenting today for evaluation of URI symptoms.  Patient's main complaint is his cough.  He has had a cough for the past 2 weeks.  Cough is been relatively stable and has not worsened.  He has had occasional chest tightness, occasional productive cough and wheezing.  He has had some rhinorrhea and congestion as well.  Denies sore throat.  Denies any known fevers.  Denies nausea or vomiting.  Denies chest pain.  Is not taking any medicines for symptoms.  Concerned that symptoms persisted for 2 weeks.  HPI  Past Medical History:  Diagnosis Date  . Diabetes mellitus without complication ([place])   . Hepatitis C   . History of CVA (cerebrovascular accident) 02/2010   Left periventricular subcortical ischemic infarction // Residual RUE and RLE weakness  . History of depression    surrounding original stroke - has since resolved (04/2012)  . Hyperlipidemia   . Hypertension   . Internal carotid artery stenosis    BL and mild per CT angiogram (05/2010)  . Prediabetes   . Tobacco abuse     Patient Active Problem List   Diagnosis Date Noted  . Lumbar radiculopathy 10/01/2015  . Diabetes mellitus type 2, controlled ([place]) 07/03/2012  . Preventative health care 04/25/2012  . Tobacco abuse   . Hypertension   . Hyperlipidemia   . History of CVA (cerebrovascular accident) 02/06/2010    History reviewed. No pertinent surgical history.     Home Medications    Prior to Admission medications   Medication Sig Start Date End Date Taking? Authorizing Provider  amLODipine (NORVASC) 10 MG tablet Take 1 tablet (10 mg total) by mouth daily. Patient not taking: Reported on 02/03/2018 05/02/17   Drenda Freeze, MD    aspirin 81 MG tablet Take 1 tablet (81 mg total) by mouth daily. Patient not taking: Reported on 02/03/2018 10/01/15   Corky Sox, MD  azithromycin (ZITHROMAX) 250 MG tablet Take 1 tablet (250 mg total) by mouth daily. Take first 2 tablets together, then 1 every day until finished. 04/03/18   Wieters, Hallie C, PA-C  benzonatate (TESSALON) 200 MG capsule Take 1 capsule (200 mg total) by mouth 3 (three) times daily as needed for up to 7 days for cough. 04/03/18 04/10/18  Wieters, Hallie C, PA-C  Cetirizine HCl 10 MG CAPS Take 1 capsule (10 mg total) by mouth daily for 10 days. 04/03/18 04/13/18  Wieters, Hallie C, PA-C  cloNIDine (CATAPRES) 0.2 MG tablet TAKE 1 TABLET BY MOUTH TWICE DAILY Patient not taking: Reported on 02/03/2018 06/09/16   Ledell Noss, MD  diphenhydrAMINE (BENADRYL) 25 mg capsule Take 1 capsule (25 mg total) by mouth at bedtime as needed for allergies or sleep. Patient not taking: Reported on 02/03/2018 07/03/12 07/03/13  Wilber Oliphant, MD  gabapentin (NEURONTIN) 300 MG capsule Take 2 capsules (600 mg total) by mouth 2 (two) times daily. Patient not taking: Reported on 02/03/2018 11/05/15   Rivet, Sindy Guadeloupe, MD  metFORMIN (GLUCOPHAGE) 500 MG tablet Take 1 tablet (500 mg total) by mouth 2 (two) times daily with a meal. Patient not taking: Reported on 02/03/2018 05/02/17   Drenda Freeze, MD  methocarbamol ([place]) 500  MG tablet Take 1 tablet (500 mg total) by mouth 2 (two) times daily. Patient not taking: Reported on 02/03/2018 07/15/16   Larene Pickett, PA-C  metoprolol tartrate (LOPRESSOR) 50 MG tablet Take 1 tablet (50 mg total) by mouth 2 (two) times daily. Patient not taking: Reported on 02/03/2018 05/02/17   Drenda Freeze, MD  pravastatin (PRAVACHOL) 20 MG tablet TAKE 1 TABLET BY MOUTH DAILY Patient not taking: Reported on 02/03/2018 04/19/16   Ledell Noss, MD  predniSONE (DELTASONE) 50 MG tablet Take 1 tablet (50 mg total) by mouth daily for 5 days. 04/03/18 04/08/18  Wieters,  Elesa Hacker, PA-C    Family History Family History  Problem Relation Age of Onset  . Diabetes Mother   . Heart failure Mother   . Hypertension Mother   . Hyperlipidemia Mother   . Glaucoma Mother   . Lung cancer Father   . Hypertension Brother   . Diabetes Sister   . Arthritis Sister     Social History Social History   Tobacco Use  . Smoking status: Current Some Day Smoker    Packs/day: 0.30    Years: 31.00    Pack years: 9.30    Types: Cigarettes    Start date: 04/25/1981  . Smokeless tobacco: Never Used  . Tobacco comment: up to 3 cigs/day; cutting back.  Substance Use Topics  . Alcohol use: Yes    Comment: occasional  . Drug use: No     Allergies   Benicar [olmesartan]; Ace inhibitors; and Angiotensin receptor blockers   Review of Systems Review of Systems  Constitutional: Negative for activity change, appetite change, chills, fatigue and fever.  HENT: Positive for congestion and rhinorrhea. Negative for ear pain, sinus pressure, sore throat and trouble swallowing.   Eyes: Negative for discharge and redness.  Respiratory: Positive for cough, chest tightness, shortness of breath and wheezing.   Cardiovascular: Negative for chest pain.  Gastrointestinal: Negative for abdominal pain, diarrhea, nausea and vomiting.  Musculoskeletal: Negative for myalgias.  Skin: Negative for rash.  Neurological: Negative for dizziness, light-headedness and headaches.     Physical Exam Triage Vital Signs ED Triage Vitals  Enc Vitals Group     BP 04/03/18 1422 (!) 140/91     Pulse Rate 04/03/18 1422 73     Resp 04/03/18 1422 20     Temp 04/03/18 1422 98.1 F (36.7 C)     Temp Source 04/03/18 1422 Oral     SpO2 04/03/18 1422 99 %     Weight --      Height --      Head Circumference --      Peak Flow --      Pain Score 04/03/18 1424 6     Pain Loc --      Pain Edu? --      Excl. in [place]? --    No data found.  Updated Vital Signs BP (!) 140/91 (BP Location: Right Arm)    Pulse 73   Temp 98.1 F (36.7 C) (Oral)   Resp 20   SpO2 99%   Visual Acuity Right Eye Distance:   Left Eye Distance:   Bilateral Distance:    Right Eye Near:   Left Eye Near:    Bilateral Near:     Physical Exam  Constitutional: He appears well-developed and well-nourished.  HENT:  Head: Normocephalic and atraumatic.  Bilateral ears without tenderness to palpation of external auricle, tragus and mastoid, EAC's without erythema or swelling,  TM's with good bony landmarks and cone of light. Non erythematous.  Oral mucosa pink and moist, no tonsillar enlargement or exudate. Posterior pharynx patent and nonerythematous, no uvula deviation or swelling. Normal phonation.  Eyes: Conjunctivae are normal.  Neck: Neck supple.  Cardiovascular: Normal rate and regular rhythm.  No murmur heard. Pulmonary/Chest: Effort normal and breath sounds normal. No respiratory distress.  Breathing comfortably at rest, CTABL, no wheezing, rales or other adventitious sounds auscultated  Abdominal: Soft. There is no tenderness.  Musculoskeletal: He exhibits no edema.  Neurological: He is alert.  Skin: Skin is warm and dry.  Psychiatric: He has a normal mood and affect.  Nursing note and vitals reviewed.    UC Treatments / Results  Labs (all labs ordered are listed, but only abnormal results are displayed) Labs Reviewed - No data to display  EKG None  Radiology Dg Chest 2 View  Result Date: 04/03/2018 CLINICAL DATA:  Cough for 2 weeks.  Fever. EXAM: CHEST - 2 VIEW COMPARISON:  February 03, 2018 FINDINGS: There are small pleural effusions bilaterally. There is no edema or consolidation. Heart is upper normal in size with pulmonary vascularity normal. No adenopathy. No bone lesions. IMPRESSION: Small pleural effusions bilaterally. No edema or consolidation. Heart normal in size. No adenopathy evident. Electronically Signed   By: Lowella Grip III M.D.   On: 04/03/2018 15:15     Procedures Procedures (including critical care time)  Medications Ordered in UC Medications - No data to display  Initial Impression / Assessment and Plan / UC Course  I have reviewed the triage vital signs and the nursing notes.  Pertinent labs & imaging results that were available during my care of the patient were reviewed by me and considered in my medical decision making (see chart for details).     Small pleural effusion seen on x-ray, no edema or consolidation.  Given pleural effusions a small, do not expect this to be the cause of his persistent symptoms.  Will treat for underlying atypical respiratory illness and bronchitis given associated wheezing.  Will provide azithromycin, prednisone as well as Tessalon.  Zyrtec/Claritin to help with any congestion or drainage.  Advised to continue to monitor cough and symptoms,Discussed strict return precautions. Patient verbalized understanding and is agreeable with plan.  Final Clinical Impressions(s) / UC Diagnoses   Final diagnoses:  Lower respiratory infection (e.g., bronchitis, pneumonia, pneumonitis, pulmonitis)     Discharge Instructions     No pneumonia or fluid on lungs Please begin azithromycin- 2 tablets today, 1 tablet for the following 4 days to cover atypical respiratory illness Begin with prednisone daily for 5 days to help with wheezing and decrease inflammation in lungs Tessalon as needed for cough Daily zyrtec or claritin to help with congestion and drainage  Follow up if cough not resolving with above, worsening, developing difficulty breathing, fever or shortness of breath   ED Prescriptions    Medication Sig Dispense Auth. Provider   azithromycin (ZITHROMAX) 250 MG tablet Take 1 tablet (250 mg total) by mouth daily. Take first 2 tablets together, then 1 every day until finished. 6 tablet Wieters, Hallie C, PA-C   predniSONE (DELTASONE) 50 MG tablet Take 1 tablet (50 mg total) by mouth daily for 5 days. 5  tablet Wieters, Hallie C, PA-C   benzonatate (TESSALON) 200 MG capsule Take 1 capsule (200 mg total) by mouth 3 (three) times daily as needed for up to 7 days for cough. 28 capsule Wieters, [place] C, PA-C  Cetirizine HCl 10 MG CAPS Take 1 capsule (10 mg total) by mouth daily for 10 days. 10 capsule Wieters, Hallie C, PA-C     Controlled Substance Prescriptions [place] Controlled Substance Registry consulted? Not Applicable   Janith Lima, [place] 04/03/18 2146

## 2018-04-03 NOTE — Discharge Instructions (Signed)
No pneumonia or fluid on lungs Please begin azithromycin- 2 tablets today, 1 tablet for the following 4 days to cover atypical respiratory illness Begin with prednisone daily for 5 days to help with wheezing and decrease inflammation in lungs Tessalon as needed for cough Daily zyrtec or claritin to help with congestion and drainage  Follow up if cough not resolving with above, worsening, developing difficulty breathing, fever or shortness of breath

## 2018-05-05 ENCOUNTER — Emergency Department (HOSPITAL_COMMUNITY): Payer: Medicaid Other

## 2018-05-05 ENCOUNTER — Encounter (HOSPITAL_COMMUNITY): Payer: Self-pay | Admitting: *Deleted

## 2018-05-05 ENCOUNTER — Observation Stay (HOSPITAL_COMMUNITY)
Admission: EM | Admit: 2018-05-05 | Discharge: 2018-05-06 | Disposition: A | Discharge status: Home / Self Care | Payer: Medicaid Other | Attending: Internal Medicine | Admitting: Internal Medicine

## 2018-05-05 ENCOUNTER — Other Ambulatory Visit: Payer: Self-pay

## 2018-05-05 DIAGNOSIS — Z720 Tobacco use: Secondary | ICD-10-CM | POA: Diagnosis present

## 2018-05-05 DIAGNOSIS — Z8673 Personal history of transient ischemic attack (TIA), and cerebral infarction without residual deficits: Secondary | ICD-10-CM | POA: Insufficient documentation

## 2018-05-05 DIAGNOSIS — I10 Essential (primary) hypertension: Secondary | ICD-10-CM | POA: Diagnosis present

## 2018-05-05 DIAGNOSIS — E119 Type 2 diabetes mellitus without complications: Secondary | ICD-10-CM | POA: Diagnosis not present

## 2018-05-05 DIAGNOSIS — E785 Hyperlipidemia, unspecified: Secondary | ICD-10-CM | POA: Diagnosis not present

## 2018-05-05 DIAGNOSIS — J90 Pleural effusion, not elsewhere classified: Secondary | ICD-10-CM | POA: Insufficient documentation

## 2018-05-05 DIAGNOSIS — R0902 Hypoxemia: Secondary | ICD-10-CM | POA: Diagnosis not present

## 2018-05-05 DIAGNOSIS — R4702 Dysphasia: Secondary | ICD-10-CM | POA: Diagnosis not present

## 2018-05-05 DIAGNOSIS — I5023 Acute on chronic systolic (congestive) heart failure: Secondary | ICD-10-CM | POA: Insufficient documentation

## 2018-05-05 DIAGNOSIS — Z79899 Other long term (current) drug therapy: Secondary | ICD-10-CM | POA: Diagnosis not present

## 2018-05-05 DIAGNOSIS — Z7984 Long term (current) use of oral hypoglycemic drugs: Secondary | ICD-10-CM | POA: Diagnosis not present

## 2018-05-05 DIAGNOSIS — I509 Heart failure, unspecified: Secondary | ICD-10-CM

## 2018-05-05 DIAGNOSIS — F17210 Nicotine dependence, cigarettes, uncomplicated: Secondary | ICD-10-CM | POA: Insufficient documentation

## 2018-05-05 DIAGNOSIS — R0602 Shortness of breath: Secondary | ICD-10-CM | POA: Diagnosis not present

## 2018-05-05 DIAGNOSIS — Z7902 Long term (current) use of antithrombotics/antiplatelets: Secondary | ICD-10-CM | POA: Diagnosis not present

## 2018-05-05 DIAGNOSIS — I110 Hypertensive heart disease with heart failure: Secondary | ICD-10-CM | POA: Diagnosis not present

## 2018-05-05 DIAGNOSIS — I5033 Acute on chronic diastolic (congestive) heart failure: Secondary | ICD-10-CM | POA: Diagnosis not present

## 2018-05-05 DIAGNOSIS — I491 Atrial premature depolarization: Secondary | ICD-10-CM | POA: Diagnosis not present

## 2018-05-05 DIAGNOSIS — I4510 Unspecified right bundle-branch block: Secondary | ICD-10-CM | POA: Diagnosis not present

## 2018-05-05 LAB — BRAIN NATRIURETIC PEPTIDE: B Natriuretic Peptide: 420.5 pg/mL — ABNORMAL HIGH (ref 0.0–100.0)

## 2018-05-05 LAB — BASIC METABOLIC PANEL
Anion gap: 9 (ref 5–15)
BUN: 9 mg/dL (ref 6–20)
CHLORIDE: 106 mmol/L (ref 98–111)
CO2: 22 mmol/L (ref 22–32)
CREATININE: 0.89 mg/dL (ref 0.61–1.24)
Calcium: 8.6 mg/dL — ABNORMAL LOW (ref 8.9–10.3)
GFR calc Af Amer: >60 mL/min (ref >60)
GFR calc non Af Amer: >60 mL/min (ref >60)
GLUCOSE: 149 mg/dL — AB (ref 70–99)
Potassium: 3.5 mmol/L (ref 3.5–5.1)
SODIUM: 137 mmol/L (ref 135–145)

## 2018-05-05 LAB — CBC WITH DIFFERENTIAL/PLATELET
ABS IMMATURE GRANULOCYTES: 0.02 10*3/uL (ref 0.00–0.07)
Basophils Absolute: 0 10*3/uL (ref 0.0–0.1)
Basophils Relative: 0 %
Eosinophils Absolute: 0.1 10*3/uL (ref 0.0–0.5)
Eosinophils Relative: 1 %
HEMATOCRIT: 34.3 % — AB (ref 39.0–52.0)
HEMOGLOBIN: 11.3 g/dL — AB (ref 13.0–17.0)
Immature Granulocytes: 0 %
LYMPHS ABS: 0.7 10*3/uL (ref 0.7–4.0)
LYMPHS PCT: 12 %
MCH: 31.1 pg (ref 26.0–34.0)
MCHC: 32.9 g/dL (ref 30.0–36.0)
MCV: 94.5 fL (ref 80.0–100.0)
MONO ABS: 0.6 10*3/uL (ref 0.1–1.0)
MONOS PCT: 10 %
Neutro Abs: 4.8 10*3/uL (ref 1.7–7.7)
Neutrophils Relative %: 77 %
Platelets: 174 10*3/uL (ref 150–400)
RBC: 3.63 MIL/uL — ABNORMAL LOW (ref 4.22–5.81)
RDW: 13.7 % (ref 11.5–15.5)
WBC: 6.3 10*3/uL (ref 4.0–10.5)
nRBC: 0 % (ref 0.0–0.2)

## 2018-05-05 LAB — I-STAT TROPONIN, ED
Troponin i, poc: 0.06 ng/mL (ref 0.00–0.08)
Troponin i, poc: 0.07 ng/mL (ref 0.00–0.08)

## 2018-05-05 LAB — GLUCOSE, CAPILLARY
Glucose-Capillary: 218 mg/dL — ABNORMAL HIGH (ref 70–99)
Glucose-Capillary: 172 mg/dL — ABNORMAL HIGH (ref 70–99)

## 2018-05-05 MED ORDER — FUROSEMIDE 10 MG/ML IJ SOLN
20.0000 mg | Freq: Two times a day (BID) | INTRAMUSCULAR | Status: DC
  Administered 2018-05-05 – 2018-05-06 (×3): 20 mg via INTRAVENOUS
  Filled 2018-05-05 (×3): qty 2

## 2018-05-05 MED ORDER — ACETAMINOPHEN 325 MG PO TABS: 650.0000 mg | ORAL_TABLET | Freq: Four times a day (QID) | ORAL | Status: DC | PRN

## 2018-05-05 MED ORDER — ALBUTEROL SULFATE (2.5 MG/3ML) 0.083% IN NEBU: 2.5000 mg | INHALATION_SOLUTION | RESPIRATORY_TRACT | Status: DC | PRN

## 2018-05-05 MED ORDER — GUAIFENESIN-DM 100-10 MG/5ML PO SYRP: 5.0000 mL | ORAL_SOLUTION | ORAL | 0 refills | Status: DC | PRN

## 2018-05-05 MED ORDER — ENOXAPARIN SODIUM 40 MG/0.4ML ~~LOC~~ SOLN
40.0000 mg | SUBCUTANEOUS | Status: DC
  Administered 2018-05-06: 40 mg via SUBCUTANEOUS
  Filled 2018-05-05: qty 0.4

## 2018-05-05 MED ORDER — FUROSEMIDE 40 MG PO TABS: 40.0000 mg | ORAL_TABLET | Freq: Every day | ORAL | 0 refills | Status: DC

## 2018-05-05 MED ORDER — METOPROLOL TARTRATE 50 MG PO TABS: 50.0000 mg | ORAL_TABLET | Freq: Two times a day (BID) | ORAL | 0 refills | Status: DC

## 2018-05-05 MED ORDER — IPRATROPIUM-ALBUTEROL 0.5-2.5 (3) MG/3ML IN SOLN
3.0000 mL | Freq: Once | RESPIRATORY_TRACT | Status: AC
  Administered 2018-05-05: 3 mL via RESPIRATORY_TRACT
  Filled 2018-05-05: qty 3

## 2018-05-05 MED ORDER — INSULIN ASPART 100 UNIT/ML ~~LOC~~ SOLN
0.0000 [IU] | Freq: Three times a day (TID) | SUBCUTANEOUS | Status: DC
  Administered 2018-05-06: 2 [IU] via SUBCUTANEOUS

## 2018-05-05 MED ORDER — AMLODIPINE BESYLATE 10 MG PO TABS: 10.0000 mg | ORAL_TABLET | Freq: Every day | ORAL | 0 refills | Status: DC

## 2018-05-05 MED ORDER — CLONIDINE HCL 0.2 MG PO TABS: 0.2000 mg | ORAL_TABLET | Freq: Two times a day (BID) | ORAL | 0 refills | Status: DC

## 2018-05-05 MED ORDER — CARVEDILOL 3.125 MG PO TABS
3.1250 mg | ORAL_TABLET | Freq: Two times a day (BID) | ORAL | Status: DC
  Administered 2018-05-06 (×2): 3.125 mg via ORAL
  Filled 2018-05-05 (×3): qty 1

## 2018-05-05 MED ORDER — POTASSIUM CHLORIDE CRYS ER 20 MEQ PO TBCR
20.0000 meq | EXTENDED_RELEASE_TABLET | Freq: Once | ORAL | Status: AC
  Administered 2018-05-05: 20 meq via ORAL
  Filled 2018-05-05: qty 1

## 2018-05-05 MED ORDER — ATORVASTATIN CALCIUM 80 MG PO TABS
80.0000 mg | ORAL_TABLET | Freq: Every day | ORAL | Status: DC
  Administered 2018-05-05 – 2018-05-06 (×2): 80 mg via ORAL
  Filled 2018-05-05 (×3): qty 1

## 2018-05-05 MED ORDER — INSULIN ASPART 100 UNIT/ML ~~LOC~~ SOLN: 0.0000 [IU] | Freq: Every day | SUBCUTANEOUS | Status: DC

## 2018-05-05 MED ORDER — ACETAMINOPHEN 650 MG RE SUPP: 650.0000 mg | Freq: Four times a day (QID) | RECTAL | Status: DC | PRN

## 2018-05-05 MED ORDER — FUROSEMIDE 10 MG/ML IJ SOLN
40.0000 mg | Freq: Once | INTRAMUSCULAR | Status: AC
  Administered 2018-05-05: 40 mg via INTRAVENOUS
  Filled 2018-05-05: qty 4

## 2018-05-05 MED ORDER — CLOPIDOGREL BISULFATE 75 MG PO TABS: 75.0000 mg | ORAL_TABLET | Freq: Every day | ORAL | Status: DC

## 2018-05-05 NOTE — ED Notes (Signed)
Pt reports hx of HTN, pt states, "I have been out of my medicine for two weeks."

## 2018-05-05 NOTE — ED Provider Notes (Signed)
After IV Lasix patient did start diuresing here however he still feels short of breath with ambulating.  Discussed with his wife and she is not comfortable with him returning home and is concerned because she he did also does not have a regular doctor to follow-up with.  Will admit for diuresis and further care   Blanchie Dessert, MD 05/05/18 1614

## 2018-05-05 NOTE — Discharge Instructions (Signed)
Return to ED for worsening symptoms, chest pain, vomiting or coughing up blood, leg swelling. It is important that you take your medications as prescribed.  Please decrease your salt intake and this will prevent more fluid from accumulating.

## 2018-05-05 NOTE — H&P (Addendum)
History and Physical    PLEASE NOTE THAT DRAGON DICTATION SOFTWARE WAS USED IN THE CONSTRUCTION OF THIS NOTE.   Ronald Marsh ZDG:[id number] DOB: 1960-12-29 DOA: 05/05/2018  PCP: Patient, No Pcp Per Patient coming from: home  I have personally briefly reviewed patient's old medical records in [place]  Chief Complaint: shortness of breath  HPI: Ronald Marsh is a 57 y.o. male with medical history significant for chronic diastolic heart failure, type 2 diabetes mellitus, hypertension, hyperlipidemia, who is admitted to [facility] on 05/05/2017 with acute on chronic diastolic heart failure at presenting from home to the [facility] emergency department complaining of shortness of breath.  The following history is obtained via my discussions with the patient, my discussions with the patient's sister who is present at bedside, my discussions with the emergency department physician, and via chart review.  The patient reports 2 to 3 days of progressive shortness of breath associated with orthopnea and development of peripheral edema.  He denies any associated chest pain, palpitations, diaphoresis, dizziness, presyncope, syncope, or nausea/vomiting.  He reports that only 7 to 10 days ago that he was diagnosed with either acute bronchitis or pneumonia, and reports good compliance and completing ensuing course of azithromycin.  Leading up to that diagnosis, the patient reports that he was experiencing shortness of breath, cough, and subjective fever/chills, with the latter of which resolving following completion of the azithromycin.  He reports that orthopnea and peripheral edema are new symptoms over the last 2 to 3 days and were not present at the time of Agnes's of respiratory infection 7 to 10 days ago.  The patient acknowledges suboptimal compliance with his outpatient medication regimen, and acknowledges that he has not been taking his lisinopril or spironolactone recently.  Otherwise,  he reports that he is prescribed no diuretics as an outpatient.  Chart review, the patient has a documented history of chronic heart failure. However, of note, the only prior echocardiogram that I have encountered in patient's chart was from October 2011, which demonstrated a normal-appearing left ventricle, with LVEF 50 to 55%, no evidence of focal wall motion abnormalities, no significant valvular pathology, and no mention of diastolic function.   ED Course: Vital signs in the emergency department were notable for the following: Temperature 98.5, heart rate 96-1 02, respiratory rate 16-20, blood pressure 147/102, and oxygen saturation 94 to 99% on room air.  Labs run in the ED were notable for the following: BMP notable for sodium 137, potassium 3.5, creatinine 0.89, and glucose 149.  CBC notable for white blood cell count of 6300 with 77% neutrophils.  BNP 420, relative to most recent prior value of 150 in September 2019.  Troponin x2 noted to be 0.06 followed by 0.07.  EKG shows normal sinus rhythm with heart rate 97, left axis, right bundle branch block, nonspecific T wave inversion in V1 and V2.  Chest x-ray, per final radiology report, relative to chest x-ray on 04/03/2018 showed interval development of cephalization of pulmonary vasculature along with bilateral pulmonary edema and small bilateral pleural effusions.  While still in the ED, the patient received Lasix 40 mg IV x1.  Subsequently, he was admitted to the med telemetry floor for further evaluation and management of acute on chronic diastolic heart failure.     Review of Systems: As per HPI otherwise 10 point review of systems negative.   Past Medical History:  Diagnosis Date  . Diabetes mellitus without complication ([place])   . Hepatitis C   .  History of CVA (cerebrovascular accident) 02/2010   Left periventricular subcortical ischemic infarction // Residual RUE and RLE weakness  . History of depression    surrounding original  stroke - has since resolved (04/2012)  . Hyperlipidemia   . Hypertension   . Internal carotid artery stenosis    BL and mild per CT angiogram (05/2010)  . Tobacco abuse     History reviewed. No pertinent surgical history.  Social History:  reports that he has been smoking cigarettes. He started smoking about 37 years ago. He has a 9.30 pack-year smoking history. He has never used smokeless tobacco. He reports current alcohol use. He reports that he does not use drugs.   Allergies  Allergen Reactions  . Benicar [Olmesartan] Anaphylaxis and Swelling    Patient cannot name the site of swelling (??)  . Ace Inhibitors Swelling    Patient cannot name the site of swelling (??)  . Angiotensin Receptor Blockers Swelling    Patient cannot name the site of swelling (??)    Family History  Problem Relation Age of Onset  . Diabetes Mother   . Heart failure Mother   . Hypertension Mother   . Hyperlipidemia Mother   . Glaucoma Mother   . Lung cancer Father   . Hypertension Brother   . Diabetes Sister   . Arthritis Sister     Prior to Admission medications   Medication Sig Start Date End Date Taking? Authorizing Provider  amLODipine (NORVASC) 10 MG tablet Take 1 tablet (10 mg total) by mouth daily. 05/05/18   Khatri, Hina, PA-C  aspirin 81 MG tablet Take 1 tablet (81 mg total) by mouth daily. Patient not taking: Reported on 05/05/2018 10/01/15   Corky Sox, MD  atorvastatin (LIPITOR) 80 MG tablet Take 80 mg by mouth daily.    [provider]  azithromycin (ZITHROMAX) 250 MG tablet Take 1 tablet (250 mg total) by mouth daily. Take first 2 tablets together, then 1 every day until finished. Patient not taking: Reported on 05/05/2018 04/03/18   Wieters, Hallie C, PA-C  carvedilol (COREG) 3.125 MG tablet Take 3.125 mg by mouth 2 (two) times daily with a meal.    [provider]  Cetirizine HCl 10 MG CAPS Take 1 capsule (10 mg total) by mouth daily for 10 days. Patient  not taking: Reported on 05/05/2018 04/03/18 05/05/18  Wieters, Hallie C, PA-C  cloNIDine (CATAPRES) 0.2 MG tablet Take 1 tablet (0.2 mg total) by mouth 2 (two) times daily. 05/05/18   Khatri, Hina, PA-C  clopidogrel (PLAVIX) 75 MG tablet Take 75 mg by mouth daily.    [provider]  diphenhydrAMINE (BENADRYL) 25 mg capsule Take 1 capsule (25 mg total) by mouth at bedtime as needed for allergies or sleep. Patient not taking: Reported on 05/05/2018 07/03/12 05/05/18  Wilber Oliphant, MD  furosemide (LASIX) 40 MG tablet Take 1 tablet (40 mg total) by mouth daily for 7 days. 05/05/18 05/12/18  Khatri, Hina, PA-C  gabapentin (NEURONTIN) 300 MG capsule Take 2 capsules (600 mg total) by mouth 2 (two) times daily. Patient not taking: Reported on 05/05/2018 11/05/15   Rivet, Sindy Guadeloupe, MD  guaiFENesin-dextromethorphan (ROBITUSSIN DM) 100-10 MG/5ML syrup Take 5 mLs by mouth every 4 (four) hours as needed for cough. 05/05/18   Khatri, Hina, PA-C  lisinopril (PRINIVIL,ZESTRIL) 20 MG tablet Take 20 mg by mouth daily.    [provider]  metFORMIN (GLUCOPHAGE) 500 MG tablet Take 1 tablet (500 mg total)  by mouth 2 (two) times daily with a meal. 05/02/17   Drenda Freeze, MD  methocarbamol (ROBAXIN) 500 MG tablet Take 1 tablet (500 mg total) by mouth 2 (two) times daily. Patient not taking: Reported on 05/05/2018 07/15/16   Larene Pickett, PA-C  metoprolol tartrate (LOPRESSOR) 50 MG tablet Take 1 tablet (50 mg total) by mouth 2 (two) times daily. 05/05/18   Khatri, Hina, PA-C  pravastatin (PRAVACHOL) 20 MG tablet TAKE 1 TABLET BY MOUTH DAILY Patient not taking: Reported on 05/05/2018 04/19/16   Ledell Noss, MD  spironolactone (ALDACTONE) 25 MG tablet Take 25 mg by mouth daily.    [provider]      Objective    Physical Exam: Vitals:   05/05/18 0957 05/05/18 1130 05/05/18 1553 05/05/18 1720  BP: (!) 153/113 (!) 142/110 (!) 156/100 (!) 147/102  Pulse: 96 96 98 (!) 102  Resp:  16 18 18 20   Temp: 98.5 F (36.9 C)  98.3 F (36.8 C) 98.3 F (36.8 C)  TempSrc: Oral  Oral Oral  SpO2:  94% 99% 100%  Weight:    89 kg  Height:    6\' 2"  (1.88 m)    General: appears to be stated age; alert, oriented Skin: warm, dry, no rash Head:  AT/[place] Mouth:  Oral mucosa membranes appear moist, normal dentition Neck: supple; trachea midline Heart:  RRR; did not appreciate any M/R/G Lungs: bibasilar crackles notes; no wheezing.  Abdomen: + BS; soft, ND, NT Extremities: no peripheral edema, no muscle wasting   Labs on Admission: I have personally reviewed following labs and imaging studies  CBC: Recent Labs  Lab 05/05/18 1038  WBC 6.3  NEUTROABS 4.8  HGB 11.3*  HCT 34.3*  MCV 94.5  PLT 259   Basic Metabolic Panel: Recent Labs  Lab 05/05/18 1038  NA 137  K 3.5  CL 106  CO2 22  GLUCOSE 149*  BUN 9  CREATININE 0.89  CALCIUM 8.6*   GFR: Estimated Creatinine Clearance: 109 mL/min (by C-G formula based on SCr of 0.89 mg/dL). Liver Function Tests: No results for input(s): AST, ALT, ALKPHOS, BILITOT, PROT, ALBUMIN in the last 168 hours. No results for input(s): LIPASE, AMYLASE in the last 168 hours. No results for input(s): AMMONIA in the last 168 hours. Coagulation Profile: No results for input(s): INR, PROTIME in the last 168 hours. Cardiac Enzymes: No results for input(s): CKTOTAL, CKMB, CKMBINDEX, TROPONINI in the last 168 hours. BNP (last 3 results) No results for input(s): PROBNP in the last 8760 hours. HbA1C: No results for input(s): HGBA1C in the last 72 hours. CBG: No results for input(s): GLUCAP in the last 168 hours. Lipid Profile: No results for input(s): CHOL, HDL, LDLCALC, TRIG, CHOLHDL, LDLDIRECT in the last 72 hours. Thyroid Function Tests: No results for input(s): TSH, T4TOTAL, FREET4, T3FREE, THYROIDAB in the last 72 hours. Anemia Panel: No results for input(s): VITAMINB12, FOLATE, FERRITIN, TIBC, IRON, RETICCTPCT in the last 72  hours. Urine analysis:    Component Value Date/Time   COLORURINE YELLOW 11/25/2017 0945   APPEARANCEUR HAZY (A) 11/25/2017 0945   LABSPEC 1.030 11/25/2017 0945   PHURINE 5.0 11/25/2017 0945   GLUCOSEU NEGATIVE 11/25/2017 0945   HGBUR SMALL (A) 11/25/2017 0945   BILIRUBINUR NEGATIVE 11/25/2017 0945   KETONESUR NEGATIVE 11/25/2017 0945   PROTEINUR 30 (A) 11/25/2017 0945   UROBILINOGEN 0.2 06/01/2010 1845   NITRITE NEGATIVE 11/25/2017 0945   LEUKOCYTESUR NEGATIVE 11/25/2017 0945    Radiological Exams on Admission:  Dg Chest 2 View  Result Date: 05/05/2018 CLINICAL DATA:  57 year old male with history of worsening shortness of breath for the past 2 days. EXAM: CHEST - 2 VIEW COMPARISON:  Chest x-ray 04/03/2018. FINDINGS: There is cephalization of the pulmonary vasculature, indistinctness of the interstitial markings, and patchy airspace disease throughout the lungs bilaterally suggestive of moderate pulmonary edema. Small bilateral pleural effusions. Mild cardiomegaly. Upper mediastinal contours are within normal limits. IMPRESSION: 1. The appearance of the chest suggests congestive heart failure, as above. Electronically Signed   By: Vinnie Langton M.D.   On: 05/05/2018 11:36     Assessment/Plan   Ronald Marsh is a 57 y.o. male with medical history significant for chronic diastolic heart failure, type 2 diabetes mellitus, hypertension, hyperlipidemia, who is admitted to [facility] on 05/05/2017 with acute on chronic diastolic heart failure at presenting from home to the [facility] emergency department complaining of shortness of breath.   Principal Problem:   Acute on chronic diastolic CHF (congestive heart failure) (HCC) Active Problems:   Tobacco abuse   Hypertension   Hyperlipidemia   Diabetes mellitus type 2, controlled ([place])   #) Acute on chronic diastolic heart failure: Diagnosis on the basis of presenting 2 to 3 days of shortness of breath associated with orthopnea  and new onset peripheral edema, with vital signs notable for mild tachycardia, BMP 3 times prior high value, and chest x-ray demonstrating interval development of cephalization of pulmonary vasculature, bilateral pulmonary edema, and small bilateral pleural effusions.  Suspect contribution from medication noncompliance, as the patient acknowledges suboptimal compliance with his outpatient afterload reduction regimen, stating that he has not routinely been taking his lisinopril.  Additionally, the patient acknowledges poor compliance with Spironolactone as his whole outpatient diuretic medication. Most recent echocardiogram that I have encountered thus far in the patient's chart was from 2011, with results as documented above.  Lasix 40 mg IV x1 administered in the ED today.  Presenting EKG shows normal sinus rhythm with nonspecific T wave inversion in V1 and V2, but otherwise no evidence of interval acute ischemic changes.  Plan: Lasix 20 mg IV twice daily.  Continue home Coreg.  Monitor strict I's and O's and daily weights.  Counseled the patient on the importance of improved compliance with outpatient cardiac regimen, including that of his outpatient afterload reduction regimen and diuretic regimen.  Monitor on telemetry.  As presenting serum potassium level is borderline low at 3.5 with the anticipation of interval diuresis, I have ordered potassium chloride 20 mEq p.o. x1 now.  Repeat BMP as well as serum magnesium level in the morning.    #) Hypertension: Patient was prescribed Coreg, lisinopril, and spironolactone, with noncompliance issues, as further documented above.  Presenting blood pressure noted to be elevated at 147/102 prior to initiation of diuretic approach.   Plan: Continue home Coreg as above.  Diuresis in the setting of acute on chronic diastolic heart failure, as further documented above.  Close monitoring of ensuing blood pressures particularly in the setting of relative naivety to  diuresis.     #) Hyperlipidemia: On atorvastatin as an outpatient.  Plan: Continue home statin.    #) Type 2 diabetes mellitus: On metformin as an outpatient.  Presenting blood sugar per presenting BMP noted to be 149.  Plan: We will hold home metformin during this hospitalization.  Have ordered Accu-Cheks q. before meals and at bedtime with moderate dose sliding scale insulin.    #) Tobacco abuse: Patient reports that he  is a current smoker.  Plan: Counseled the patient on the importance of complete smoking discontinuation.  Politely refused my offer for nicotine patch at this time.     DVT prophylaxis: Lovenox 40 mg [place] qday Code Status: full Family Communication: case discussed with patient's sister, who is present at bedside Disposition Plan:  Per Rounding Team Consults called: (none)  Admission status: observation; med-tele.     PLEASE NOTE THAT DRAGON DICTATION SOFTWARE WAS USED IN THE CONSTRUCTION OF THIS NOTE.   [place] [facility] Pager [phone] From 3PM- 11PM.   Otherwise, please contact night-coverage  [email or web address] Password [facility]  05/05/2018, 6:27 PM

## 2018-05-05 NOTE — ED Notes (Signed)
Patient transported to X-ray 

## 2018-05-05 NOTE — ED Notes (Signed)
Pt ambulated in hallway on pulse ox. O2 saturation stayed between 96%-90%. Gait was unsteady (pt usually walks with a cane). Pt stated that he was "feeling weak" and short of breath. Pt also stated that his eyes were "blurry." EDP, Hina Khatri, notified.

## 2018-05-05 NOTE — ED Notes (Addendum)
Pt provided with ice water per RN, Audry Riles.

## 2018-05-05 NOTE — ED Provider Notes (Addendum)
[place] [facility] EMERGENCY DEPARTMENT Provider Note   CSN: [id number] Arrival date & time: 05/05/18  4098     History   Chief Complaint Chief Complaint  Patient presents with  . Shortness of Breath    HPI Ronald Marsh is a 57 y.o. male with a past medical history of diabetes, hypertension, hyperlipidemia, tobacco abuse who presents to ED for evaluation of ongoing shortness of breath, cough intermittently productive with mucus.  He was seen and evaluated last week and was diagnosed with pneumonia.  He was given azithromycin he reports compliance and completion of this medication.  However, he states that for the past 2 days he is gotten worse but overall "have not felt better since last week."  He is noncompliant with his antihypertensives.  He tells me "I just take 2 heart pills I don't know what they are called."  He denies any chest pain, fever, body aches, vomiting, leg swelling.  Patient recently diagnosed with CHF however, is not on a diuretic.  HPI  Past Medical History:  Diagnosis Date  . Diabetes mellitus without complication ([place])   . Hepatitis C   . History of CVA (cerebrovascular accident) 02/2010   Left periventricular subcortical ischemic infarction // Residual RUE and RLE weakness  . History of depression    surrounding original stroke - has since resolved (04/2012)  . Hyperlipidemia   . Hypertension   . Internal carotid artery stenosis    BL and mild per CT angiogram (05/2010)  . Prediabetes   . Tobacco abuse     Patient Active Problem List   Diagnosis Date Noted  . Lumbar radiculopathy 10/01/2015  . Diabetes mellitus type 2, controlled ([place]) 07/03/2012  . Preventative health care 04/25/2012  . Tobacco abuse   . Hypertension   . Hyperlipidemia   . History of CVA (cerebrovascular accident) 02/06/2010    History reviewed. No pertinent surgical history.      Home Medications    Prior to Admission medications   Medication Sig Start  Date End Date Taking? Authorizing Provider  metFORMIN (GLUCOPHAGE) 500 MG tablet Take 1 tablet (500 mg total) by mouth 2 (two) times daily with a meal. 05/02/17  Yes Drenda Freeze, MD  amLODipine (NORVASC) 10 MG tablet Take 1 tablet (10 mg total) by mouth daily. 05/05/18   Solmon Bohr, PA-C  aspirin 81 MG tablet Take 1 tablet (81 mg total) by mouth daily. Patient not taking: Reported on 05/05/2018 10/01/15   Corky Sox, MD  azithromycin (ZITHROMAX) 250 MG tablet Take 1 tablet (250 mg total) by mouth daily. Take first 2 tablets together, then 1 every day until finished. Patient not taking: Reported on 05/05/2018 04/03/18   Wieters, Madelynn Done C, PA-C  Cetirizine HCl 10 MG CAPS Take 1 capsule (10 mg total) by mouth daily for 10 days. 04/03/18 05/05/18  Wieters, Hallie C, PA-C  cloNIDine (CATAPRES) 0.2 MG tablet Take 1 tablet (0.2 mg total) by mouth 2 (two) times daily. 05/05/18   Darrall Strey, PA-C  diphenhydrAMINE (BENADRYL) 25 mg capsule Take 1 capsule (25 mg total) by mouth at bedtime as needed for allergies or sleep. Patient not taking: Reported on 05/05/2018 07/03/12 05/05/18  Wilber Oliphant, MD  furosemide (LASIX) 40 MG tablet Take 1 tablet (40 mg total) by mouth daily for 7 days. 05/05/18 05/12/18  Laurens Matheny, PA-C  gabapentin (NEURONTIN) 300 MG capsule Take 2 capsules (600 mg total) by mouth 2 (two) times daily. Patient not taking: Reported on  05/05/2018 11/05/15   Rivet, Sindy Guadeloupe, MD  guaiFENesin-dextromethorphan (ROBITUSSIN DM) 100-10 MG/5ML syrup Take 5 mLs by mouth every 4 (four) hours as needed for cough. 05/05/18   Hao Dion, PA-C  methocarbamol (ROBAXIN) 500 MG tablet Take 1 tablet (500 mg total) by mouth 2 (two) times daily. Patient not taking: Reported on 05/05/2018 07/15/16   Larene Pickett, PA-C  metoprolol tartrate (LOPRESSOR) 50 MG tablet Take 1 tablet (50 mg total) by mouth 2 (two) times daily. 05/05/18   Denora Wysocki, PA-C  pravastatin (PRAVACHOL) 20 MG tablet TAKE 1  TABLET BY MOUTH DAILY Patient not taking: No sig reported 04/19/16   Ledell Noss, MD    Family History Family History  Problem Relation Age of Onset  . Diabetes Mother   . Heart failure Mother   . Hypertension Mother   . Hyperlipidemia Mother   . Glaucoma Mother   . Lung cancer Father   . Hypertension Brother   . Diabetes Sister   . Arthritis Sister     Social History Social History   Tobacco Use  . Smoking status: Current Some Day Smoker    Packs/day: 0.30    Years: 31.00    Pack years: 9.30    Types: Cigarettes    Start date: 04/25/1981  . Smokeless tobacco: Never Used  . Tobacco comment: up to 3 cigs/day; cutting back.  Substance Use Topics  . Alcohol use: Yes    Comment: occasional  . Drug use: No     Allergies   Benicar [olmesartan]; Ace inhibitors; and Angiotensin receptor blockers   Review of Systems Review of Systems  Constitutional: Negative for appetite change, chills and fever.  HENT: Negative for ear pain, rhinorrhea, sneezing and sore throat.   Eyes: Negative for photophobia and visual disturbance.  Respiratory: Positive for cough and shortness of breath. Negative for chest tightness and wheezing.   Cardiovascular: Negative for chest pain and palpitations.  Gastrointestinal: Negative for abdominal pain, blood in stool, constipation, diarrhea, nausea and vomiting.  Genitourinary: Negative for dysuria, hematuria and urgency.  Musculoskeletal: Negative for myalgias.  Skin: Negative for rash.  Neurological: Negative for dizziness, weakness and light-headedness.     Physical Exam Updated Vital Signs BP (!) 142/110 (BP Location: Right Arm)   Pulse 96   Temp 98.5 F (36.9 C) (Oral)   Resp 18   Ht 6\' 2"  (1.88 m)   Wt 99.8 kg   SpO2 94%   BMI 28.25 kg/m   Physical Exam Vitals signs and nursing note reviewed.  Constitutional:      General: He is not in acute distress.    Appearance: He is well-developed.  HENT:     Head: Normocephalic and  atraumatic.     Nose: Nose normal.  Eyes:     General: No scleral icterus.       Left eye: No discharge.     Conjunctiva/sclera: Conjunctivae normal.  Neck:     Musculoskeletal: Normal range of motion and neck supple.  Cardiovascular:     Rate and Rhythm: Normal rate and regular rhythm.     Heart sounds: Normal heart sounds. No murmur. No friction rub. No gallop.   Pulmonary:     Effort: Pulmonary effort is normal. No respiratory distress.     Breath sounds: Examination of the right-lower field reveals wheezing. Examination of the left-lower field reveals wheezing. Wheezing present.  Abdominal:     General: Bowel sounds are normal. There is no distension.  Palpations: Abdomen is soft.     Tenderness: There is no abdominal tenderness. There is no guarding.  Musculoskeletal: Normal range of motion.     Comments: No lower extremity edema, erythema or calf tenderness bilaterally.  Skin:    General: Skin is warm and dry.     Findings: No rash.  Neurological:     Mental Status: He is alert.     Motor: No abnormal muscle tone.     Coordination: Coordination normal.      ED Treatments / Results  Labs (all labs ordered are listed, but only abnormal results are displayed) Labs Reviewed  BASIC METABOLIC PANEL - Abnormal; Notable for the following components:      Result Value   Glucose, Bld 149 (*)    Calcium 8.6 (*)    All other components within normal limits  CBC WITH DIFFERENTIAL/PLATELET - Abnormal; Notable for the following components:   RBC 3.63 (*)    Hemoglobin 11.3 (*)    HCT 34.3 (*)    All other components within normal limits  BRAIN NATRIURETIC PEPTIDE - Abnormal; Notable for the following components:   B Natriuretic Peptide 420.5 (*)    All other components within normal limits  I-STAT TROPONIN, ED  I-STAT TROPONIN, ED    EKG EKG Interpretation  Date/Time:  Saturday May 05 2018 10:56:11 EST Ventricular Rate:  97 PR Interval:  176 QRS  Duration: 128 QT Interval:  396 QTC Calculation: 502 R Axis:   -77 Text Interpretation:  Normal sinus rhythm Left axis deviation new  Right bundle branch block Minimal voltage criteria for LVH, may be normal variant Confirmed by Blanchie Dessert [phone]) on 05/05/2018 12:27:02 PM   Radiology Dg Chest 2 View  Result Date: 05/05/2018 CLINICAL DATA:  57 year old male with history of worsening shortness of breath for the past 2 days. EXAM: CHEST - 2 VIEW COMPARISON:  Chest x-ray 04/03/2018. FINDINGS: There is cephalization of the pulmonary vasculature, indistinctness of the interstitial markings, and patchy airspace disease throughout the lungs bilaterally suggestive of moderate pulmonary edema. Small bilateral pleural effusions. Mild cardiomegaly. Upper mediastinal contours are within normal limits. IMPRESSION: 1. The appearance of the chest suggests congestive heart failure, as above. Electronically Signed   By: Vinnie Langton M.D.   On: 05/05/2018 11:36    Procedures Procedures (including critical care time)  Medications Ordered in ED Medications  ipratropium-albuterol (DUONEB) 0.5-2.5 (3) MG/3ML nebulizer solution 3 mL (3 mLs Nebulization Given 05/05/18 1128)  furosemide (LASIX) injection 40 mg (40 mg Intravenous Given 05/05/18 1321)     Initial Impression / Assessment and Plan / ED Course  I have reviewed the triage vital signs and the nursing notes.  Pertinent labs & imaging results that were available during my care of the patient were reviewed by me and considered in my medical decision making (see chart for details).     57 year old male with a past medical history of diabetes, hypertension, hyperlipidemia presents to ED for ongoing shortness of breath, cough intermittently productive with mucus.  He was diagnosed with pneumonia last week and was given antibiotics.  Patient is noncompliant with his home medications and states that he only takes 2 pills but does not know the  name.  He was recently diagnosed with CHF however it does not appear that he is on a diuretic according to his most recent medication list that he brought with him.  On my exam patient has some wheezing noted on bilateral lower lung fields.  He is satting at 94 to 96% on room air.  There is a cough noted.  Lab work significant for BNP of 420 which is elevated from 100 prior reading several days ago.  Normal creatinine.  Initial and delta troponin both negative.  CBC, remainder of BMP are unremarkable.  Chest x-ray shows congestive heart failure.  EKG shows new right bundle branch block which could also be consistent with his new onset CHF.  Patient was given IV Lasix here with significant diuresis.  He ambulated staying at 90 to 96% on room air.  He reports improvement in his shortness of breath at rest with the medications given.  I stressed the importance of medication compliance for his condition and following up with PCP.  Will give 40 mg of Lasix p.o. for 1 week as well as a refill for the medications that he believes he is out of.  Will give cough medication as well.  Will advise him to return to ED for any severe worsening symptoms. I left a message with the case manager regarding any further follow-up for the patient and obtaining a PCP.  Patient discussed with my attending, Dr. Maryan Rued.  Patient is hemodynamically stable, in NAD, and able to ambulate in the ED. Evaluation does not show pathology that would require ongoing emergent intervention or inpatient treatment. I explained the diagnosis to the patient. Pain has been managed and has no complaints prior to discharge. Patient is comfortable with above plan and is stable for discharge at this time. All questions were answered prior to disposition. Strict return precautions for returning to the ED were discussed. Encouraged follow up with PCP.    Portions of this note were generated with [employer]. Dictation errors may occur despite  best attempts at proofreading.  Final Clinical Impressions(s) / ED Diagnoses   Final diagnoses:  Acute congestive heart failure, unspecified heart failure type [facility])    ED Discharge Orders         Ordered    amLODipine (NORVASC) 10 MG tablet  Daily     05/05/18 1530    metoprolol tartrate (LOPRESSOR) 50 MG tablet  2 times daily     05/05/18 1530    cloNIDine (CATAPRES) 0.2 MG tablet  2 times daily     05/05/18 1530    guaiFENesin-dextromethorphan (ROBITUSSIN DM) 100-10 MG/5ML syrup  Every 4 hours PRN     05/05/18 1530    furosemide (LASIX) 40 MG tablet  Daily     05/05/18 1530            Delia Heady, PA-C 05/05/18 1538    Blanchie Dessert, MD 05/06/18 1308

## 2018-05-05 NOTE — ED Triage Notes (Signed)
Pt in via [facility], per report pt in c/o SOB worsening x 2 days, recent dx of PNA with taking Azithromycin with completion, pt called EMS while trying to walk to hospital, pt has clear sputum with productive cough, pt on RA, pt recently dx of CHF, speaking in full sentences, A&O x4

## 2018-05-06 DIAGNOSIS — I5033 Acute on chronic diastolic (congestive) heart failure: Secondary | ICD-10-CM | POA: Diagnosis present

## 2018-05-06 LAB — BASIC METABOLIC PANEL
Anion gap: 11 (ref 5–15)
BUN: 8 mg/dL (ref 6–20)
CO2: 26 mmol/L (ref 22–32)
Calcium: 8.6 mg/dL — ABNORMAL LOW (ref 8.9–10.3)
Chloride: 102 mmol/L (ref 98–111)
Creatinine, Ser: 1.06 mg/dL (ref 0.61–1.24)
GFR calc non Af Amer: >60 mL/min (ref >60)
Glucose, Bld: 149 mg/dL — ABNORMAL HIGH (ref 70–99)
Potassium: 3 mmol/L — ABNORMAL LOW (ref 3.5–5.1)
Sodium: 139 mmol/L (ref 135–145)

## 2018-05-06 LAB — CBC
HEMATOCRIT: 36.8 % — AB (ref 39.0–52.0)
Hemoglobin: 12.4 g/dL — ABNORMAL LOW (ref 13.0–17.0)
MCH: 31 pg (ref 26.0–34.0)
MCHC: 33.7 g/dL (ref 30.0–36.0)
MCV: 92 fL (ref 80.0–100.0)
Platelets: 235 10*3/uL (ref 150–400)
RBC: 4 MIL/uL — ABNORMAL LOW (ref 4.22–5.81)
RDW: 13.3 % (ref 11.5–15.5)
WBC: 6.9 10*3/uL (ref 4.0–10.5)
nRBC: 0 % (ref 0.0–0.2)

## 2018-05-06 LAB — MAGNESIUM: Magnesium: 1.5 mg/dL — ABNORMAL LOW (ref 1.7–2.4)

## 2018-05-06 LAB — GLUCOSE, CAPILLARY
Glucose-Capillary: 138 mg/dL — ABNORMAL HIGH (ref 70–99)
Glucose-Capillary: 128 mg/dL — ABNORMAL HIGH (ref 70–99)

## 2018-05-06 MED ORDER — ASPIRIN 81 MG PO TABS: 81.0000 mg | ORAL_TABLET | Freq: Every day | ORAL | 5 refills | Status: DC

## 2018-05-06 MED ORDER — METFORMIN HCL 500 MG PO TABS: 500.0000 mg | ORAL_TABLET | Freq: Two times a day (BID) | ORAL | 0 refills | Status: DC

## 2018-05-06 MED ORDER — SPIRONOLACTONE 25 MG PO TABS: 25.0000 mg | ORAL_TABLET | Freq: Every day | ORAL | 0 refills | Status: DC

## 2018-05-06 MED ORDER — CARVEDILOL 3.125 MG PO TABS: 3.1250 mg | ORAL_TABLET | Freq: Two times a day (BID) | ORAL | 0 refills | Status: DC

## 2018-05-06 MED ORDER — ATORVASTATIN CALCIUM 80 MG PO TABS: 80.0000 mg | ORAL_TABLET | Freq: Every day | ORAL | 0 refills | Status: DC

## 2018-05-06 MED ORDER — CLOPIDOGREL BISULFATE 75 MG PO TABS: 75.0000 mg | ORAL_TABLET | Freq: Every day | ORAL | 0 refills | Status: DC

## 2018-05-06 MED ORDER — POTASSIUM CHLORIDE CRYS ER 20 MEQ PO TBCR
40.0000 meq | EXTENDED_RELEASE_TABLET | Freq: Two times a day (BID) | ORAL | Status: DC
  Administered 2018-05-06: 40 meq via ORAL
  Filled 2018-05-06: qty 4

## 2018-05-06 MED ORDER — AMLODIPINE BESYLATE 10 MG PO TABS: 10.0000 mg | ORAL_TABLET | Freq: Every day | ORAL | 0 refills | Status: DC

## 2018-05-06 MED ORDER — MAGNESIUM OXIDE 400 (241.3 MG) MG PO TABS
400.0000 mg | ORAL_TABLET | Freq: Two times a day (BID) | ORAL | Status: DC
  Administered 2018-05-06: 400 mg via ORAL
  Filled 2018-05-06: qty 1

## 2018-05-06 MED ORDER — MAGNESIUM SULFATE 2 GM/50ML IV SOLN
2.0000 g | Freq: Once | INTRAVENOUS | Status: AC
  Administered 2018-05-06: 2 g via INTRAVENOUS
  Filled 2018-05-06: qty 50

## 2018-05-06 MED ORDER — POTASSIUM CHLORIDE 10 MEQ/100ML IV SOLN
10.0000 meq | INTRAVENOUS | Status: AC
  Administered 2018-05-06: 10 meq via INTRAVENOUS
  Filled 2018-05-06 (×2): qty 100

## 2018-05-06 NOTE — Care Management (Addendum)
Acknowledge consult for patient having difficulty getting meds. Unfortunately financial assistance cannot be provided due to established medication coverage through Medicaid. Bedside delivery of home medications through [facility] is not available on weekends.  Follow up appointment will be made by [facility] at [facility] Monday. Patient can establish PCP and use their pharmacy on site once established.

## 2018-05-06 NOTE — Discharge Summary (Signed)
Physician Discharge Summary  Ronald Marsh WUJ:[id number] DOB: July 30, 1960 DOA: 05/05/2018  PCP: Patient, No Pcp Per  Admit date: 05/05/2018 Discharge date: 05/06/2018  Admitted From: Home Disposition: Home  Recommendations for Outpatient Follow-up:  1. Follow up with [facility] wellness clinic tomorrow morning to establish care and pick up prescriptions  Home Health: No Equipment/Devices: None  Discharge Condition: Stable CODE STATUS: Full code Diet recommendation: Heart Healthy / Carb Modified   Brief/Interim Summary: Ronald Marsh is a 57 y.o. male with medical history significant for chronic diastolic heart failure, type 2 diabetes mellitus, hypertension, hyperlipidemia, who is admitted to [facility] on 05/05/2017 with acute on chronic diastolic heart failure at presenting from home to the [facility] emergency department complaining of shortness of breath. The patient reports 2 to 3 days of progressive shortness of breath associated with orthopnea and development of peripheral edema.  He denies any associated chest pain, palpitations, diaphoresis, dizziness, presyncope, syncope, or nausea/vomiting.  He reports that only 7 to 10 days ago that he was diagnosed with either acute bronchitis or pneumonia, and reports good compliance and completing ensuing course of azithromycin.  Leading up to that diagnosis, the patient reports that he was experiencing shortness of breath, cough, and subjective fever/chills, with the latter of which resolving following completion of the azithromycin.  He reports that orthopnea and peripheral edema are new symptoms over the last 2 to 3 days and were not present at the time of Ronald Marsh's of respiratory infection 7 to 10 days ago.  The patient acknowledges suboptimal compliance with his outpatient medication regimen, and acknowledges that he has not been taking his lisinopril or spironolactone recently.  Otherwise, he reports that he is prescribed no  diuretics as an outpatient.  Review of pharmacy records indicates that he has not picked up prescription since 14 January 2018. Was admitted into the hospital and prescribed Lasix IV twice daily and had a brisk diuresis.  At the time of discharge we show that he has diuresed a cumulative amount of 2.3 L.  Weight has decreased from 89 kg to 87.8 kg.  He is not requiring oxygen. Case management has seen the patient and is arranging for him to be followed up in the community health and wellness clinic where he can have his prescriptions filled as well.  Prescriptions are sent to [facility] health wellness clinic for patient to pick up tomorrow after he establishes care.    Patient has reached maximal benefit of hospitalization.  Discharge diagnosis, prognosis, plans, follow-up, medications and treatments discussed with the patient(or responsible party) and is in agreement with the plans as described.  Patient is stable for discharge.  Discharge Diagnoses:  Principal Problem:   Acute on chronic diastolic CHF (congestive heart failure) (HCC) Active Problems:   Tobacco abuse   Hypertension   Hyperlipidemia   Diabetes mellitus type 2, controlled [facility])    Discharge Instructions  Discharge Instructions    (HEART FAILURE PATIENTS) Call MD:  Anytime you have any of the following symptoms: 1) 3 pound weight gain in 24 hours or 5 pounds in 1 week 2) shortness of breath, with or without a dry hacking cough 3) swelling in the hands, feet or stomach 4) if you have to sleep on extra pillows at night in order to breathe.   Complete by:  As directed    Diet - low sodium heart healthy   Complete by:  As directed    Discharge instructions   Complete by:  As  directed    Go to [facility] health wellness clinic on Monday morning.  Have care established and get prescriptions filled.   Increase activity slowly   Complete by:  As directed      Allergies as of 05/06/2018      Reactions   Benicar [olmesartan] Anaphylaxis,  Swelling   Patient cannot name the site of swelling (??)   Ace Inhibitors Swelling   Patient cannot name the site of swelling (??)   Angiotensin Receptor Blockers Swelling   Patient cannot name the site of swelling (??)      Medication List    STOP taking these medications   azithromycin 250 MG tablet Commonly known as:  ZITHROMAX   Cetirizine HCl 10 MG Caps   cloNIDine 0.2 MG tablet Commonly known as:  CATAPRES   diphenhydrAMINE 25 mg capsule Commonly known as:  BENADRYL   gabapentin 300 MG capsule Commonly known as:  NEURONTIN   lisinopril 20 MG tablet Commonly known as:  PRINIVIL,ZESTRIL   methocarbamol 500 MG tablet Commonly known as:  ROBAXIN   pravastatin 20 MG tablet Commonly known as:  PRAVACHOL     TAKE these medications   amLODipine 10 MG tablet Commonly known as:  NORVASC Take 1 tablet (10 mg total) by mouth daily.   aspirin 81 MG tablet Take 1 tablet (81 mg total) by mouth daily.   atorvastatin 80 MG tablet Commonly known as:  LIPITOR Take 1 tablet (80 mg total) by mouth daily.   carvedilol 3.125 MG tablet Commonly known as:  COREG Take 1 tablet (3.125 mg total) by mouth 2 (two) times daily with a meal.   clopidogrel 75 MG tablet Commonly known as:  PLAVIX Take 1 tablet (75 mg total) by mouth daily.   metFORMIN 500 MG tablet Commonly known as:  GLUCOPHAGE Take 1 tablet (500 mg total) by mouth 2 (two) times daily with a meal.   metoprolol tartrate 50 MG tablet Commonly known as:  LOPRESSOR Take 1 tablet (50 mg total) by mouth 2 (two) times daily.   spironolactone 25 MG tablet Commonly known as:  ALDACTONE Take 1 tablet (25 mg total) by mouth daily.      Follow-up Information    Schedule an appointment as soon as possible for a visit  with [place] [facility].   Specialty:  Cardiology Contact information: [address] [id number] [place] [phone]       Go  to [facility].   Why:  go tomorrow to establish care and then be able to get your medicines through their pharmacy Contact information: [place] [address] [phone]       [facility] EMERGENCY DEPARTMENT.   Specialty:  Emergency Medicine Why:  If symptoms worsen Contact information: [address] [id number] [place] [address] [phone]         Allergies  Allergen Reactions  . Benicar [Olmesartan] Anaphylaxis and Swelling    Patient cannot name the site of swelling (??)  . Ace Inhibitors Swelling    Patient cannot name the site of swelling (??)  . Angiotensin Receptor Blockers Swelling    Patient cannot name the site of swelling (??)    Consultations:  None   Procedures/Studies: Dg Chest 2 View  Result Date: 05/05/2018 CLINICAL DATA:  57 year old male with history of worsening shortness of breath for the past 2 days. EXAM: CHEST -  2 VIEW COMPARISON:  Chest x-ray 04/03/2018. FINDINGS: There is cephalization of the pulmonary vasculature, indistinctness of the interstitial markings, and patchy airspace disease throughout the lungs bilaterally suggestive of moderate pulmonary edema. Small bilateral pleural effusions. Mild cardiomegaly. Upper mediastinal contours are within normal limits. IMPRESSION: 1. The appearance of the chest suggests congestive heart failure, as above. Electronically Signed   By: Vinnie Langton M.D.   On: 05/05/2018 11:36      Subjective: Objectively patient improved.  Sats are 100% on room air.  Blood pressure has normalized.  Discharge Exam: Vitals:   05/06/18 0933 05/06/18 1209  BP: 129/90 (!) 120/93  Pulse: 92 86  Resp: 20 20  Temp: 97.7 F (36.5 C)   SpO2: 91% 100%   Vitals:   05/06/18 0031 05/06/18 0607 05/06/18 0933 05/06/18 1209  BP: (!) 135/92 133/90 129/90 (!) 120/93  Pulse: 97 95 92 86  Resp: 20 18 20 20    Temp: 98.5 F (36.9 C) 98.7 F (37.1 C) 97.7 F (36.5 C)   TempSrc: Oral Oral Oral   SpO2: 100% 96% 91% 100%  Weight:  87.8 kg    Height:        General: Pt is alert, awake, not in acute distress Cardiovascular: RRR, S1/S2 +, no rubs, no gallops Respiratory: CTA bilaterally, no wheezing, no rhonchi Abdominal: Soft, NT, ND, bowel sounds + Extremities: no edema, no cyanosis    The results of significant diagnostics from this hospitalization (including imaging, microbiology, ancillary and laboratory) are listed below for reference.     Microbiology: No results found for this or any previous visit (from the past 240 hour(s)).   Labs: BNP (last 3 results) Recent Labs    02/03/18 0410 05/05/18 1038  BNP 152.3* 403.4*   Basic Metabolic Panel: Recent Labs  Lab 05/05/18 1038 05/06/18 0601  NA 137 139  K 3.5 3.0*  CL 106 102  CO2 22 26  GLUCOSE 149* 149*  BUN 9 8  CREATININE 0.89 1.06  CALCIUM 8.6* 8.6*  MG  --  1.5*   Liver Function Tests: No results for input(s): AST, ALT, ALKPHOS, BILITOT, PROT, ALBUMIN in the last 168 hours. No results for input(s): LIPASE, AMYLASE in the last 168 hours. No results for input(s): AMMONIA in the last 168 hours. CBC: Recent Labs  Lab 05/05/18 1038 05/06/18 0601  WBC 6.3 6.9  NEUTROABS 4.8  --   HGB 11.3* 12.4*  HCT 34.3* 36.8*  MCV 94.5 92.0  PLT 174 235   Cardiac Enzymes: No results for input(s): CKTOTAL, CKMB, CKMBINDEX, TROPONINI in the last 168 hours. BNP: Invalid input(s): POCBNP CBG: Recent Labs  Lab 05/05/18 1717 05/05/18 2129 05/06/18 0719 05/06/18 1206  GLUCAP 218* 172* 138* 128*   D-Dimer No results for input(s): DDIMER in the last 72 hours. Hgb A1c No results for input(s): HGBA1C in the last 72 hours. Lipid Profile No results for input(s): CHOL, HDL, LDLCALC, TRIG, CHOLHDL, LDLDIRECT in the last 72 hours. Thyroid function studies No results for input(s): TSH, T4TOTAL, T3FREE, THYROIDAB in the last  72 hours.  Invalid input(s): FREET3 Anemia work up No results for input(s): VITAMINB12, FOLATE, FERRITIN, TIBC, IRON, RETICCTPCT in the last 72 hours. Urinalysis    Component Value Date/Time   COLORURINE YELLOW 11/25/2017 0945   APPEARANCEUR HAZY (A) 11/25/2017 0945   LABSPEC 1.030 11/25/2017 0945   PHURINE 5.0 11/25/2017 0945   GLUCOSEU NEGATIVE 11/25/2017 0945   HGBUR SMALL (A) 11/25/2017 0945   BILIRUBINUR NEGATIVE 11/25/2017  [place] 11/25/2017 0945   PROTEINUR 30 (A) 11/25/2017 0945   UROBILINOGEN 0.2 06/01/2010 1845   NITRITE NEGATIVE 11/25/2017 0945   LEUKOCYTESUR NEGATIVE 11/25/2017 0945   Sepsis Labs Invalid input(s): PROCALCITONIN,  WBC,  LACTICIDVEN Microbiology No results found for this or any previous visit (from the past 240 hour(s)).   Time coordinating discharge: 38 minutes  SIGNED:   Lady Deutscher, MD  FACP [facility] 05/06/2018, 1:20 PM Pager   If 7PM-7AM, please contact night-coverage [email or web address] Password [email or web address]

## 2018-05-06 NOTE — Progress Notes (Signed)
Pt c/o iv k burning in iv, flushed and slowed down to   54ml/hr and still c/o burning, decreased to 42ml and paged Dr Evangeline Gula to request po instead

## 2018-05-06 NOTE — Progress Notes (Signed)
Wife at bedside and concerned with discharge home, paged Dr Evangeline Gula and she spoke to wife via the telephone.

## 2018-05-06 NOTE — Progress Notes (Signed)
On review of AVS, no rx for metoprolol per paperwork. Paged Dr Evangeline Gula, advised wife waiting on md to get rx for med before discharge

## 2018-05-06 NOTE — Plan of Care (Signed)
  Problem: Education: Goal: Ability to verbalize understanding of medication therapies will improve Outcome: Progressing   Problem: Activity: Goal: Capacity to carry out activities will improve Outcome: Progressing   

## 2018-05-07 LAB — HIV ANTIBODY (ROUTINE TESTING W REFLEX): HIV Screen 4th Generation wRfx: NONREACTIVE

## 2018-05-07 MED FILL — CARVEDILOL 3.125 MG TABLET: 3.125 | 30 days supply | Qty: 60 | Fill #0

## 2018-05-07 MED FILL — AMLODIPINE BESYLATE 10 MG T: 10 | 30 days supply | Qty: 30 | Fill #0

## 2018-05-07 MED FILL — SPIRONOLACTONE 25 MG TABLET: 25 | 30 days supply | Qty: 30 | Fill #0

## 2018-05-07 MED FILL — metFORMIN HCL 500 MG TABS: 500 | 30 days supply | Qty: 60 | Fill #0

## 2018-05-07 MED FILL — CLOPIDOGREL 75 MG TABLET: 75 | 30 days supply | Qty: 30 | Fill #0

## 2018-05-07 MED FILL — ATORVASTATIN 80 MG TABLET: 80 | 30 days supply | Qty: 30 | Fill #0

## 2018-05-23 ENCOUNTER — Inpatient Hospital Stay: Payer: Medicaid Other | Admitting: Critical Care Medicine

## 2018-06-12 ENCOUNTER — Ambulatory Visit: Payer: Medicaid Other | Attending: Critical Care Medicine | Admitting: Critical Care Medicine

## 2018-06-12 ENCOUNTER — Encounter: Payer: Self-pay | Admitting: Critical Care Medicine

## 2018-06-12 VITALS — BP 118/72 | HR 82 | Temp 98.9°F | Resp 18 | Ht 74.5 in | Wt 196.0 lb

## 2018-06-12 DIAGNOSIS — I110 Hypertensive heart disease with heart failure: Secondary | ICD-10-CM | POA: Insufficient documentation

## 2018-06-12 DIAGNOSIS — I5033 Acute on chronic diastolic (congestive) heart failure: Secondary | ICD-10-CM | POA: Diagnosis not present

## 2018-06-12 DIAGNOSIS — R768 Other specified abnormal immunological findings in serum: Secondary | ICD-10-CM | POA: Diagnosis not present

## 2018-06-12 DIAGNOSIS — M21611 Bunion of right foot: Secondary | ICD-10-CM | POA: Diagnosis not present

## 2018-06-12 DIAGNOSIS — B1920 Unspecified viral hepatitis C without hepatic coma: Secondary | ICD-10-CM | POA: Insufficient documentation

## 2018-06-12 DIAGNOSIS — I429 Cardiomyopathy, unspecified: Secondary | ICD-10-CM | POA: Diagnosis not present

## 2018-06-12 DIAGNOSIS — E785 Hyperlipidemia, unspecified: Secondary | ICD-10-CM | POA: Diagnosis not present

## 2018-06-12 DIAGNOSIS — F17210 Nicotine dependence, cigarettes, uncomplicated: Secondary | ICD-10-CM

## 2018-06-12 DIAGNOSIS — E1142 Type 2 diabetes mellitus with diabetic polyneuropathy: Secondary | ICD-10-CM | POA: Diagnosis not present

## 2018-06-12 DIAGNOSIS — M21612 Bunion of left foot: Secondary | ICD-10-CM | POA: Diagnosis not present

## 2018-06-12 DIAGNOSIS — Z720 Tobacco use: Secondary | ICD-10-CM

## 2018-06-12 DIAGNOSIS — I10 Essential (primary) hypertension: Secondary | ICD-10-CM | POA: Diagnosis not present

## 2018-06-12 DIAGNOSIS — Z1159 Encounter for screening for other viral diseases: Secondary | ICD-10-CM

## 2018-06-12 DIAGNOSIS — G8929 Other chronic pain: Secondary | ICD-10-CM | POA: Insufficient documentation

## 2018-06-12 DIAGNOSIS — Z7902 Long term (current) use of antithrombotics/antiplatelets: Secondary | ICD-10-CM | POA: Insufficient documentation

## 2018-06-12 DIAGNOSIS — Z1211 Encounter for screening for malignant neoplasm of colon: Secondary | ICD-10-CM

## 2018-06-12 DIAGNOSIS — M5416 Radiculopathy, lumbar region: Secondary | ICD-10-CM | POA: Diagnosis not present

## 2018-06-12 DIAGNOSIS — R195 Other fecal abnormalities: Secondary | ICD-10-CM | POA: Diagnosis not present

## 2018-06-12 DIAGNOSIS — M79673 Pain in unspecified foot: Secondary | ICD-10-CM | POA: Insufficient documentation

## 2018-06-12 DIAGNOSIS — Z7984 Long term (current) use of oral hypoglycemic drugs: Secondary | ICD-10-CM | POA: Insufficient documentation

## 2018-06-12 DIAGNOSIS — Z8619 Personal history of other infectious and parasitic diseases: Secondary | ICD-10-CM | POA: Diagnosis not present

## 2018-06-12 DIAGNOSIS — Z8673 Personal history of transient ischemic attack (TIA), and cerebral infarction without residual deficits: Secondary | ICD-10-CM | POA: Insufficient documentation

## 2018-06-12 DIAGNOSIS — E7800 Pure hypercholesterolemia, unspecified: Secondary | ICD-10-CM | POA: Diagnosis not present

## 2018-06-12 DIAGNOSIS — F1911 Other psychoactive substance abuse, in remission: Secondary | ICD-10-CM

## 2018-06-12 DIAGNOSIS — B351 Tinea unguium: Secondary | ICD-10-CM | POA: Diagnosis not present

## 2018-06-12 DIAGNOSIS — Z9189 Other specified personal risk factors, not elsewhere classified: Secondary | ICD-10-CM | POA: Insufficient documentation

## 2018-06-12 DIAGNOSIS — Z8249 Family history of ischemic heart disease and other diseases of the circulatory system: Secondary | ICD-10-CM | POA: Insufficient documentation

## 2018-06-12 DIAGNOSIS — Z7901 Long term (current) use of anticoagulants: Secondary | ICD-10-CM | POA: Insufficient documentation

## 2018-06-12 LAB — POCT GLYCOSYLATED HEMOGLOBIN (HGB A1C): Hemoglobin A1C: 6.4 % — AB (ref 4.0–5.6)

## 2018-06-12 LAB — POCT ABI - SCREENING FOR PILOT NO CHARGE
Immediate ABI left: 1.25
Immediate ABI right: 1.26

## 2018-06-12 LAB — GLUCOSE, POCT (MANUAL RESULT ENTRY): POC Glucose: 169 mg/dl — AB (ref 70–99)

## 2018-06-12 MED ORDER — AMLODIPINE BESYLATE 10 MG PO TABS: 10.0000 mg | ORAL_TABLET | Freq: Every day | ORAL | 2 refills | Status: DC

## 2018-06-12 MED ORDER — ASPIRIN 81 MG PO TABS: 81.0000 mg | ORAL_TABLET | Freq: Every day | ORAL | 5 refills | Status: DC

## 2018-06-12 MED ORDER — CLOPIDOGREL BISULFATE 75 MG PO TABS: 75.0000 mg | ORAL_TABLET | Freq: Every day | ORAL | 3 refills | Status: DC

## 2018-06-12 MED ORDER — NAPROXEN 375 MG PO TABS: 375.0000 mg | ORAL_TABLET | Freq: Two times a day (BID) | ORAL | 2 refills | Status: DC | PRN

## 2018-06-12 MED ORDER — CARVEDILOL 3.125 MG PO TABS: 3.1250 mg | ORAL_TABLET | Freq: Two times a day (BID) | ORAL | 4 refills | Status: DC

## 2018-06-12 MED ORDER — ATORVASTATIN CALCIUM 80 MG PO TABS: 80.0000 mg | ORAL_TABLET | Freq: Every day | ORAL | 4 refills | Status: DC

## 2018-06-12 MED ORDER — SPIRONOLACTONE 25 MG PO TABS: 25.0000 mg | ORAL_TABLET | Freq: Every day | ORAL | 4 refills | Status: DC

## 2018-06-12 MED ORDER — METFORMIN HCL 500 MG PO TABS: 500.0000 mg | ORAL_TABLET | Freq: Two times a day (BID) | ORAL | 4 refills | Status: DC

## 2018-06-12 MED FILL — ATORVASTATIN 80 MG TABLET: 80 | 30 days supply | Qty: 30 | Fill #0

## 2018-06-12 MED FILL — SPIRONOLACTONE 25 MG TABLET: 25 | 30 days supply | Qty: 30 | Fill #0

## 2018-06-12 MED FILL — AMLODIPINE BESYLATE 10 MG T: 10 | 30 days supply | Qty: 30 | Fill #0

## 2018-06-12 MED FILL — CLOPIDOGREL 75 MG TABLET: 75 | 30 days supply | Qty: 30 | Fill #0

## 2018-06-12 MED FILL — CARVEDILOL 3.125 MG TABLET: 3.125 | 30 days supply | Qty: 60 | Fill #0

## 2018-06-12 MED FILL — NAPROXEN 375 MG TABLET: 375 | 30 days supply | Qty: 60 | Fill #0

## 2018-06-12 MED FILL — metFORMIN HCL 500 MG TABS: 500 | 30 days supply | Qty: 60 | Fill #0

## 2018-06-12 NOTE — Assessment & Plan Note (Signed)
Hypertension well-controlled at this time  We will refill beta-blocker and spironolactone

## 2018-06-12 NOTE — Assessment & Plan Note (Signed)
History of hyperlipidemia we will need to recheck lipid panel and will refill at [facility]

## 2018-06-12 NOTE — Patient Instructions (Addendum)
Labs will be obtained today to include a metabolic panel, hepatitis study, and urine for protein and lipid panel  Refills on all of your medications were sent to our pharmacy and I also included a prescription for Naprosyn to take twice daily as needed for back and hip pain  An echocardiogram ultrasound of your heart will be scheduled  An x-ray of your lower back will be scheduled  Focus on stopping smoking  A stool card will be given so we can screen you for colon cancer  We will ask our licensed clinical social worker to meet with you regarding your cocaine use do not give you the contact with substance abuse counseling  A referral to a foot specialist will be made for your toenail and bunion conditions  We will schedule return visit to establish with primary care in the next 4 weeks

## 2018-06-12 NOTE — Assessment & Plan Note (Signed)
Type 2 diabetes well controlled with current hemoglobin A1c of 6.4  We will refill the metformin

## 2018-06-12 NOTE — Assessment & Plan Note (Signed)
History of acute diastolic heart failure stable at this time  We will refill beta-blockers and antihypertensive medications along with spironolactone and follow-up basic metabolic panel  We will obtain an echocardiogram

## 2018-06-12 NOTE — Assessment & Plan Note (Signed)
History of recent cocaine use  Referral will be made to drug counseling through our licensed clinical social worker

## 2018-06-12 NOTE — Progress Notes (Addendum)
Subjective:    Patient ID: Ronald Marsh, male    DOB: October 19, 1960, 58 y.o.   [name]: [id number]  58 y.o.M here for f/u of HTN ,CHF diastolic  This patient was admitted between the 28th and 06 May 2018 for acute diastolic heart failure and hypoxemia.  The patient good does give a history of using cocaine on a frequent basis.  Patient also has ongoing tobacco use.  The patient had previously been seen for acute bronchitis and was given a course of azithromycin with some improvement in the fever and productive cough however the patient return to the emergency department on 05/05/2018 with acute diastolic heart failure.  The patient was given intravenous diuretics and his blood pressure medicines were adjusted and then he was sent home the following day.  Note there is also a prior history of stroke involving the right side of the patient  The patient is here today to establish for primary care and transition of care follow-up.  Patient does note chronic pain in the lower back.  He also complains of pain in the feet area.  He has run out of some of the medications given at discharge.  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 Diabetes mellitus without complication ([place])   . Hepatitis C   . History of CVA (cerebrovascular accident) 02/2010   Left periventricular subcortical ischemic infarction // Residual RUE and RLE weakness  . History of depression    surrounding original stroke - has since resolved (04/2012)  . Hyperlipidemia   . Hypertension   . Internal carotid artery stenosis    BL and mild per CT angiogram (05/2010)  . Tobacco abuse      Family History   Problem Relation Age of Onset  . Diabetes Mother   . Heart failure Mother   . Hypertension Mother   . Hyperlipidemia Mother   . Glaucoma Mother   . Lung cancer Father   . Hypertension Brother   . Diabetes Sister   . Arthritis Sister      Social History   Socioeconomic History  . Marital status: Married    Spouse name: Not on file  . Number of children: 1  . Years of education: 11th grade  . Highest education level: Not on file  Occupational History  . Occupation: On disability    Comment: previously worked for [employer] until his stroke in 2011.   Social Needs  . Financial resource strain: Not on file  . Food insecurity:    Worry: Not on file    Inability: Not on file  . Transportation needs:    Medical: Not on file    Non-medical: Not on file  Tobacco Use  . Smoking status: Current Some Day Smoker    Packs/day: 0.30    Years: 31.00    Pack years: 9.30    Types: Cigarettes    Start date: 04/25/1981  . Smokeless tobacco: Never Used  . Tobacco comment: up to 3 cigs/day; cutting back.  Substance and Sexual Activity  . Alcohol use: Yes    Comment: occasional  . Drug use: No  .  Sexual activity: Yes  Lifestyle  . Physical activity:    Days per week: Not on file    Minutes per session: Not on file  . Stress: Not on file  Relationships  . Social connections:    Talks on phone: Not on file    Gets together: Not on file    Attends religious service: Not on file    Active member of club or organization: Not on file    Attends meetings of clubs or organizations: Not on file    Relationship status: Not on file  . Intimate partner violence:    Fear of current or ex partner: Not on file    Emotionally abused: Not on file    Physically abused: Not on file    Forced sexual activity: Not on file  Other Topics Concern  . Not on file  Social History Narrative   Lives in [place] with wife.   Has been incarcerated multiple times, last time was in 2012 for 8  months.     Allergies  Allergen Reactions  . Benicar [Olmesartan] Anaphylaxis and Swelling    Patient cannot name the site of swelling (??)  . Ace Inhibitors Swelling    Patient cannot name the site of swelling (??)  . Angiotensin Receptor Blockers Swelling    Patient cannot name the site of swelling (??)     Outpatient Medications Prior to Visit  Medication Sig Dispense Refill  . metFORMIN (GLUCOPHAGE) 500 MG tablet Take 1 tablet (500 mg total) by mouth 2 (two) times daily with a meal. 60 tablet 0  . amLODipine (NORVASC) 10 MG tablet Take 1 tablet (10 mg total) by mouth daily. (Patient not taking: Reported on 06/12/2018) 30 tablet 0  . aspirin 81 MG tablet Take 1 tablet (81 mg total) by mouth daily. (Patient not taking: Reported on 06/12/2018) 30 tablet 5  . atorvastatin (LIPITOR) 80 MG tablet Take 1 tablet (80 mg total) by mouth daily. (Patient not taking: Reported on 06/12/2018) 30 tablet 0  . carvedilol (COREG) 3.125 MG tablet Take 1 tablet (3.125 mg total) by mouth 2 (two) times daily with a meal. (Patient not taking: Reported on 06/12/2018) 60 tablet 0  . clopidogrel (PLAVIX) 75 MG tablet Take 1 tablet (75 mg total) by mouth daily. (Patient not taking: Reported on 06/12/2018) 30 tablet 0  . spironolactone (ALDACTONE) 25 MG tablet Take 1 tablet (25 mg total) by mouth daily. (Patient not taking: Reported on 06/12/2018) 30 tablet 0   No facility-administered medications prior to visit.     Review of Systems  Constitutional: Negative for diaphoresis, fatigue and fever.  HENT: Negative.   Respiratory: Negative for cough, chest tightness, shortness of breath and wheezing.   Cardiovascular: Negative for chest pain, palpitations and leg swelling.       No edema  Genitourinary: Positive for difficulty urinating. Negative for frequency, hematuria, scrotal swelling and testicular pain.  Musculoskeletal:       Hips and right sided leg pain.   Neurological: Positive for weakness. Negative for  numbness.  Psychiatric/Behavioral: Negative for self-injury, sleep disturbance and suicidal ideas. The patient is not nervous/anxious.        Objective:   Physical Exam  Vitals:   06/12/18 1119  BP: 118/72  Pulse: 82  Resp: 18  Temp: 98.9 F (37.2 C)  TempSrc: Oral  SpO2: 96%  Weight: 196 lb (88.9 kg)  Height: 6' 2.5" (1.892 m)    Gen: Pleasant, well-nourished, in no distress,  normal affect  ENT: No lesions,  mouth clear,  oropharynx clear, no postnasal drip  Neck: No JVD, no TMG, no carotid bruits  Lungs: No use of accessory muscles, no dullness to percussion, clear without rales or rhonchi  Cardiovascular: RRR, heart sounds normal, no murmur or gallops, no peripheral edema  Abdomen: soft and NT, no HSM,  BS normal  Musculoskeletal: No deformities, no cyanosis or clubbing Bilateral toenail fungus in the lower extremities and significant bunions over the outer aspects of the right and left great toe  Neuro: alert, non focal  Skin: Warm, no lesions or rashes  No results found.  HgbA1C 6.4 CBG 169 ABI 1.3 both LE No echocardiogram in the system No lumbar films in the system     Assessment & Plan:  I personally reviewed all images and lab data in the [facility] system as well as any outside material available during this office visit and agree with the  radiology impressions.   History of substance abuse ([place]) History of recent cocaine use  Referral will be made to drug counseling through our licensed clinical social worker  Hyperlipidemia History of hyperlipidemia we will need to recheck lipid panel and will refill at at[facility]  Bilateral bunions Bilateral toe fungus and bunions and a diabetic will refer to podiatry  Lumbar radiculopathy Lumbar radiculopathy without evidence for focal neurologic deficit  We will obtain lumbar films and prescribe Naprosyn twice daily as needed 375 mg and will avoid opioids  Diabetes mellitus type 2, controlled ([place]) Type  2 diabetes well controlled with current hemoglobin A1c of 6.4  We will refill the metformin  Acute on chronic diastolic CHF (congestive heart failure) ([place]) History of acute diastolic heart failure stable at this time  We will refill beta-blockers and antihypertensive medications along with spironolactone and follow-up basic metabolic panel  We will obtain an echocardiogram  Hypertension Hypertension well-controlled at this time  We will refill beta-blocker and spironolactone   Ronald Marsh was seen today for hospitalization follow-up.  Diagnoses and all orders for this visit:  Acute on chronic diastolic CHF (congestive heart failure) ([place]) -     Comprehensive metabolic panel -     ECHOCARDIOGRAM COMPLETE; Future  Controlled type 2 diabetes mellitus with diabetic polyneuropathy, without long-term current use of insulin (H[facility]) -     HgB A1c -     Glucose (CBG) -     Comprehensive metabolic panel  Essential hypertension  Pure hypercholesterolemia -     Lipid Panel  Lumbar radiculopathy -     DG Lumbar Spine Complete; Future  History of CVA (cerebrovascular accident) -     aspirin 81 MG tablet; Take 1 tablet (81 mg total) by mouth daily. -     POCT ABI Screening Pilot No Charge  Colon cancer screening -     Fecal occult blood, imunochemical  Encounter for hepatitis C virus screening test for high risk patient -     Hepatitis c antibody (reflex) -     Comprehensive metabolic panel  Toenail fungus -     Ambulatory referral to Podiatry -     POCT ABI Screening Pilot No Charge  Bilateral bunions -     Ambulatory referral to Podiatry  Tobacco abuse  Cardiomyopathy, unspecified type ([place])  History of substance abuse ([place])  Other orders -     spironolactone (ALDACTONE) 25 MG tablet; Take 1 tablet (25 mg total) by mouth daily. -     metFORMIN (GLUCOPHAGE) 500 MG tablet;  Take 1 tablet (500 mg total) by mouth 2 (two) times daily with a meal. -     clopidogrel (PLAVIX) 75  MG tablet; Take 1 tablet (75 mg total) by mouth daily. -     carvedilol (COREG) 3.125 MG tablet; Take 1 tablet (3.125 mg total) by mouth 2 (two) times daily with a meal. -     atorvastatin (LIPITOR) 80 MG tablet; Take 1 tablet (80 mg total) by mouth daily. -     amLODipine (NORVASC) 10 MG tablet; Take 1 tablet (10 mg total) by mouth daily. -     naproxen (NAPROSYN) 375 MG tablet; Take 1 tablet (375 mg total) by mouth 2 (two) times daily as needed for moderate pain.  Note with previous history of hepatitis C in the criminal justice system will recheck hepatitis panel and give consideration to referral to infectious disease  Note ABI screen normal , doubt PVD  I had an extended discussion with the patient and or family lasting 75minutes of a 25 minute visit including: Smoking cessation and also counseling for cocaine use

## 2018-06-12 NOTE — Assessment & Plan Note (Signed)
Lumbar radiculopathy without evidence for focal neurologic deficit  We will obtain lumbar films and prescribe Naprosyn twice daily as needed 375 mg and will avoid opioids

## 2018-06-12 NOTE — Assessment & Plan Note (Signed)
Bilateral toe fungus and bunions and a diabetic will refer to podiatry

## 2018-06-13 ENCOUNTER — Telehealth: Payer: Self-pay | Admitting: Critical Care Medicine

## 2018-06-13 DIAGNOSIS — R768 Other specified abnormal immunological findings in serum: Secondary | ICD-10-CM

## 2018-06-13 DIAGNOSIS — B182 Chronic viral hepatitis C: Secondary | ICD-10-CM | POA: Insufficient documentation

## 2018-06-13 LAB — COMPREHENSIVE METABOLIC PANEL
ALT: 61 IU/L — ABNORMAL HIGH (ref 0–44)
AST: 53 IU/L — ABNORMAL HIGH (ref 0–40)
Albumin/Globulin Ratio: 1.2 (ref 1.2–2.2)
Albumin: 3.9 g/dL (ref 3.8–4.9)
Alkaline Phosphatase: 86 IU/L (ref 39–117)
BUN/Creatinine Ratio: 10 (ref 9–20)
BUN: 10 mg/dL (ref 6–24)
Bilirubin Total: 0.4 mg/dL (ref 0.0–1.2)
CO2: 21 mmol/L (ref 20–29)
Calcium: 9.1 mg/dL (ref 8.7–10.2)
Chloride: 103 mmol/L (ref 96–106)
Creatinine, Ser: 1.01 mg/dL (ref 0.76–1.27)
GFR calc Af Amer: 96 mL/min/{1.73_m2} (ref >59)
GFR calc non Af Amer: 83 mL/min/{1.73_m2} (ref >59)
Globulin, Total: 3.2 g/dL (ref 1.5–4.5)
Glucose: 175 mg/dL — ABNORMAL HIGH (ref 65–99)
POTASSIUM: 3.9 mmol/L (ref 3.5–5.2)
Sodium: 143 mmol/L (ref 134–144)
Total Protein: 7.1 g/dL (ref 6.0–8.5)

## 2018-06-13 LAB — LIPID PANEL
Chol/HDL Ratio: 2.3 ratio (ref 0.0–5.0)
Cholesterol, Total: 163 mg/dL (ref 100–199)
HDL: 72 mg/dL (ref >39)
LDL Calculated: 72 mg/dL (ref 0–99)
Triglycerides: 93 mg/dL (ref 0–149)
VLDL Cholesterol Cal: 19 mg/dL (ref 5–40)

## 2018-06-13 LAB — HEPATITIS C ANTIBODY (REFLEX): HCV Ab: >11 s/co ratio — ABNORMAL HIGH (ref 0.0–0.9)

## 2018-06-13 LAB — COMMENT2 - HEP PANEL

## 2018-06-13 NOTE — Telephone Encounter (Signed)
Ronald Marsh, can you call Ronald Marsh, [name] is positive,  I need to send an PCR lab study to see if there is viral load and he may need infectious disease referral.  Also you can tell him other labs: lipid, cbc, bmp all ok  Except he does have mild liver function test elevation which may be due to Hep C  I put lab orders in

## 2018-06-13 NOTE — Telephone Encounter (Signed)
Attempt to call patient to give results. Voicemail is not set up. Unable to reach. Will call back.

## 2018-06-14 NOTE — Telephone Encounter (Signed)
Noted, thank you Carilyn Goodpasture

## 2018-06-14 NOTE — Telephone Encounter (Signed)
Attempt to contact patient again. Unable to leave message, voicemail has not been set up. Will mail letter.

## 2018-06-15 ENCOUNTER — Telehealth: Payer: Self-pay | Admitting: *Deleted

## 2018-06-15 NOTE — Telephone Encounter (Signed)
Thank you [place]

## 2018-06-15 NOTE — Telephone Encounter (Signed)
-----   Message from Elsie Stain, MD sent at 06/15/2018  4:07 PM EST ----- [place]  We really need to verbally speak to this patient, his Hepatitis C is positive and he has > 900K copies of virus in his system and elevated LFTs   We need to get him to [facility] for a consult ASAP.   ??Certified letter??  ??Partner with Delana Meyer to find him ???  I try is cell and no VM set up and no answer.    Dr Joya Gaskins

## 2018-06-15 NOTE — Telephone Encounter (Signed)
MA contacted patients sister with no answer Regino Schultze). MA spoke with patients wife Winfrey Chillemi). MA requested patient return the phone call the the providers office ASAP. MA advised wife of not being able to share details due to patient not signing a release for wife. Wife states she will try another number for patient and try to drive to where she knows he "hangs" Wife admitted that she is not able to keep up with her husband due to him running around. MA expressed the importance of patient returning the phone call.

## 2018-06-18 DIAGNOSIS — R768 Other specified abnormal immunological findings in serum: Secondary | ICD-10-CM | POA: Diagnosis not present

## 2018-06-18 LAB — HCV RNA QUANT
HCV log10: 5.966 log10 IU/mL
Hepatitis C Quantitation: 924000 IU/mL

## 2018-06-18 LAB — SPECIMEN STATUS REPORT

## 2018-06-18 NOTE — Telephone Encounter (Signed)
Patient received results in person and additional labs were completed today.

## 2018-06-18 NOTE — Telephone Encounter (Signed)
Follow up   Pt's wife returning call for nurse, states she found her husband and is calling about results. Please f/u

## 2018-06-18 NOTE — Telephone Encounter (Signed)
Pt name and DOB verified. Patient aware of results and result note per Dr. Joya Gaskins.  Pt wife also in the room when results were given. Pt gave verbal consent and signed DPR to release information to wife.   Advised patient to disregard letter that was sent to address since he was in office for results.

## 2018-06-19 ENCOUNTER — Ambulatory Visit (HOSPITAL_COMMUNITY): Admission: RE | Admit: 2018-06-19 | Payer: Medicaid Other | Source: Ambulatory Visit

## 2018-06-19 ENCOUNTER — Telehealth: Payer: Self-pay | Admitting: Critical Care Medicine

## 2018-06-19 DIAGNOSIS — R768 Other specified abnormal immunological findings in serum: Secondary | ICD-10-CM

## 2018-06-19 LAB — HCV RNA QUANT
HCV log10: 6.453 log10 IU/mL
Hepatitis C Quantitation: 2840000 IU/mL

## 2018-06-19 NOTE — Telephone Encounter (Signed)
Ronald Marsh,  I have results of RNA Quant for Hep C,  It is quite elevated.  He needs referral to [facility],  Infectious [facility] hepatitis C clinic  I put in the referral.    pls let his wife know

## 2018-06-19 NOTE — Telephone Encounter (Addendum)
Pt wife aware of message. Name and DOB verified. Advised that referral was made and [facility] office would call for an OV. Verbalized understanding.  Also reminded  that appointment for ECHO was scheduled for today. She states that he did not inform her of the appointment she may/ may not be able to take him today.

## 2018-06-26 ENCOUNTER — Telehealth: Payer: Self-pay | Admitting: *Deleted

## 2018-06-26 NOTE — Telephone Encounter (Signed)
Pt wife given number to [facility] to make an appointment. Advised that the referral was sent to [facility]. They placed a call to the patient but left a message on voicemail.

## 2018-06-29 ENCOUNTER — Ambulatory Visit (HOSPITAL_COMMUNITY): Payer: Medicaid Other

## 2018-07-03 ENCOUNTER — Ambulatory Visit (HOSPITAL_COMMUNITY)
Admission: RE | Admit: 2018-07-03 | Discharge: 2018-07-03 | Disposition: A | Discharge status: Home / Self Care | Payer: Medicaid Other | Source: Ambulatory Visit | Attending: Critical Care Medicine | Admitting: Critical Care Medicine

## 2018-07-03 DIAGNOSIS — I5033 Acute on chronic diastolic (congestive) heart failure: Secondary | ICD-10-CM | POA: Diagnosis not present

## 2018-07-03 DIAGNOSIS — E785 Hyperlipidemia, unspecified: Secondary | ICD-10-CM | POA: Insufficient documentation

## 2018-07-03 DIAGNOSIS — F17200 Nicotine dependence, unspecified, uncomplicated: Secondary | ICD-10-CM | POA: Insufficient documentation

## 2018-07-03 DIAGNOSIS — I110 Hypertensive heart disease with heart failure: Secondary | ICD-10-CM | POA: Insufficient documentation

## 2018-07-03 NOTE — Progress Notes (Signed)
  Echocardiogram  2D Echocardiogram has been performed.  Dorotha Hirschi L Androw 07/03/2018, 10:50 AM

## 2018-07-04 ENCOUNTER — Telehealth: Payer: Self-pay | Admitting: Critical Care Medicine

## 2018-07-04 DIAGNOSIS — I427 Cardiomyopathy due to drug and external agent: Secondary | ICD-10-CM

## 2018-07-04 NOTE — Telephone Encounter (Signed)
Ronald Marsh could you work on referral in the morning please.

## 2018-07-04 NOTE — Telephone Encounter (Signed)
I spoke to the pts spouse and told her to have the pt call back  Echo shows weak heart muscle EF 25%  Needs referral to CHF clinic which I put an order

## 2018-07-06 ENCOUNTER — Telehealth (HOSPITAL_COMMUNITY): Payer: Self-pay | Admitting: Vascular Surgery

## 2018-07-06 ENCOUNTER — Telehealth: Payer: Self-pay | Admitting: General Practice

## 2018-07-06 NOTE — Telephone Encounter (Signed)
Spoke with patient's spouse, she is aware of appt 07/25/18 @11  am with Dr. Haroldine Laws.  New Pt packet mailed

## 2018-07-06 NOTE — Telephone Encounter (Signed)
Good Morning  Ronald Marsh  [facility] called  [facility] and they told me to send you a message  . Ronald Marsh need to be seen there  Please, see the message below from Dr  Joya Gaskins   Thank you  For your attention and help .

## 2018-07-06 NOTE — Telephone Encounter (Signed)
Pt is sch to see Dr Haroldine Laws in CHF clinic on 3/18

## 2018-07-06 NOTE — Telephone Encounter (Signed)
Left pt message to make New CHF appt  W/ DB 07/25/18 asked pt to call back to make appt

## 2018-07-09 NOTE — Telephone Encounter (Signed)
Thank you   Have a Nice Day !

## 2018-07-17 ENCOUNTER — Encounter (HOSPITAL_COMMUNITY): Payer: Self-pay | Admitting: *Deleted

## 2018-07-17 ENCOUNTER — Other Ambulatory Visit: Payer: Self-pay

## 2018-07-17 ENCOUNTER — Emergency Department (HOSPITAL_COMMUNITY)
Admission: EM | Admit: 2018-07-17 | Discharge: 2018-07-18 | Disposition: A | Discharge status: Home / Self Care | Payer: Medicaid Other | Attending: Emergency Medicine | Admitting: Emergency Medicine

## 2018-07-17 DIAGNOSIS — I5032 Chronic diastolic (congestive) heart failure: Secondary | ICD-10-CM | POA: Insufficient documentation

## 2018-07-17 DIAGNOSIS — Z7902 Long term (current) use of antithrombotics/antiplatelets: Secondary | ICD-10-CM | POA: Diagnosis not present

## 2018-07-17 DIAGNOSIS — Z7982 Long term (current) use of aspirin: Secondary | ICD-10-CM | POA: Insufficient documentation

## 2018-07-17 DIAGNOSIS — Z7984 Long term (current) use of oral hypoglycemic drugs: Secondary | ICD-10-CM | POA: Diagnosis not present

## 2018-07-17 DIAGNOSIS — Z79899 Other long term (current) drug therapy: Secondary | ICD-10-CM | POA: Insufficient documentation

## 2018-07-17 DIAGNOSIS — Y93H2 Activity, gardening and landscaping: Secondary | ICD-10-CM | POA: Insufficient documentation

## 2018-07-17 DIAGNOSIS — I110 Hypertensive heart disease with heart failure: Secondary | ICD-10-CM | POA: Diagnosis not present

## 2018-07-17 DIAGNOSIS — Y92007 Garden or yard of unspecified non-institutional (private) residence as the place of occurrence of the external cause: Secondary | ICD-10-CM | POA: Insufficient documentation

## 2018-07-17 DIAGNOSIS — S60561A Insect bite (nonvenomous) of right hand, initial encounter: Secondary | ICD-10-CM | POA: Insufficient documentation

## 2018-07-17 DIAGNOSIS — Y999 Unspecified external cause status: Secondary | ICD-10-CM | POA: Insufficient documentation

## 2018-07-17 DIAGNOSIS — F17210 Nicotine dependence, cigarettes, uncomplicated: Secondary | ICD-10-CM | POA: Diagnosis not present

## 2018-07-17 DIAGNOSIS — W57XXXA Bitten or stung by nonvenomous insect and other nonvenomous arthropods, initial encounter: Secondary | ICD-10-CM | POA: Insufficient documentation

## 2018-07-17 DIAGNOSIS — E119 Type 2 diabetes mellitus without complications: Secondary | ICD-10-CM | POA: Diagnosis not present

## 2018-07-17 DIAGNOSIS — L089 Local infection of the skin and subcutaneous tissue, unspecified: Secondary | ICD-10-CM | POA: Diagnosis not present

## 2018-07-17 MED FILL — AMLODIPINE BESYLATE 10 MG T: 10 | 30 days supply | Qty: 30 | Fill #1

## 2018-07-17 MED FILL — CLOPIDOGREL 75 MG TABLET: 75 | 30 days supply | Qty: 30 | Fill #1

## 2018-07-17 MED FILL — CARVEDILOL 3.125 MG TABLET: 3.125 | 30 days supply | Qty: 60 | Fill #1

## 2018-07-17 MED FILL — SPIRONOLACTONE 25 MG TABLET: 25 | 30 days supply | Qty: 30 | Fill #1

## 2018-07-17 MED FILL — metFORMIN HCL 500 MG TABS: 500 | 30 days supply | Qty: 60 | Fill #1

## 2018-07-17 MED FILL — ATORVASTATIN 80 MG TABLET: 80 | 30 days supply | Qty: 30 | Fill #1

## 2018-07-17 NOTE — ED Provider Notes (Signed)
[facility] EMERGENCY DEPARTMENT Provider Note   CSN: [id number] Arrival date & time: 07/17/18  2209    History   Chief Complaint Chief Complaint  Patient presents with  . Hand Pain    HPI Ronald Marsh is a 57 y.o. male is here for evaluation of insect bite.  States this afternoon he was in the yard picking up branches when he felt a tingling sensation to the right ring finger.  Initially had no other associated symptoms but over the course of the last several hours has noticed that there has been associated swelling and mild pain localized to the right ring finger and distal hand.  No interventions for this.  Denies associated redness, warmth, lesions, paresthesias.  No pain proximally to the hand, wrist or arm.  Denies IV drug use.  No fevers.  No overlying rash.  Pain slightly worsens with direct palpation and movement of the finger.     HPI  Past Medical History:  Diagnosis Date  . Diabetes mellitus without complication ([place])   . Hepatitis C   . History of CVA (cerebrovascular accident) 02/2010   Left periventricular subcortical ischemic infarction // Residual RUE and RLE weakness  . History of depression    surrounding original stroke - has since resolved (04/2012)  . Hyperlipidemia   . Hypertension   . Internal carotid artery stenosis    BL and mild per CT angiogram (05/2010)  . Tobacco abuse     Patient Active Problem List   Diagnosis Date Noted  . Hepatitis C antibody positive in blood 06/13/2018  . Toenail fungus 06/12/2018  . Bilateral bunions 06/12/2018  . Acute on chronic diastolic CHF (congestive heart failure) ([place]) 05/06/2018  . Cardiomyopathy ([place]) 01/31/2018  . History of substance abuse ([place]) 02/19/2016  . Lumbar radiculopathy 10/01/2015  . Diabetes mellitus type 2, controlled ([place]) 07/03/2012  . Preventative health care 04/25/2012  . Tobacco abuse   . Hypertension   . Hyperlipidemia   . History of CVA (cerebrovascular accident)  02/06/2010    History reviewed. No pertinent surgical history.      Home Medications    Prior to Admission medications   Medication Sig Start Date End Date Taking? Authorizing Provider  amLODipine (NORVASC) 10 MG tablet Take 1 tablet (10 mg total) by mouth daily. 06/12/18   Elsie Stain, MD  aspirin 81 MG tablet Take 1 tablet (81 mg total) by mouth daily. 06/12/18   Elsie Stain, MD  atorvastatin (LIPITOR) 80 MG tablet Take 1 tablet (80 mg total) by mouth daily. 06/12/18   Elsie Stain, MD  carvedilol (COREG) 3.125 MG tablet Take 1 tablet (3.125 mg total) by mouth 2 (two) times daily with a meal. 06/12/18   Elsie Stain, MD  clopidogrel (PLAVIX) 75 MG tablet Take 1 tablet (75 mg total) by mouth daily. 06/12/18   Elsie Stain, MD  metFORMIN (GLUCOPHAGE) 500 MG tablet Take 1 tablet (500 mg total) by mouth 2 (two) times daily with a meal. 06/12/18   Elsie Stain, MD  naproxen (NAPROSYN) 375 MG tablet Take 1 tablet (375 mg total) by mouth 2 (two) times daily as needed for moderate pain. 06/12/18   Elsie Stain, MD  spironolactone (ALDACTONE) 25 MG tablet Take 1 tablet (25 mg total) by mouth daily. 06/12/18   Elsie Stain, MD    Family History Family History  Problem Relation Age of Onset  . Diabetes Mother   . Heart failure  Mother   . Hypertension Mother   . Hyperlipidemia Mother   . Glaucoma Mother   . Lung cancer Father   . Hypertension Brother   . Diabetes Sister   . Arthritis Sister     Social History Social History   Tobacco Use  . Smoking status: Current Some Day Smoker    Packs/day: 0.30    Years: 31.00    Pack years: 9.30    Types: Cigarettes    Start date: 04/25/1981  . Smokeless tobacco: Never Used  . Tobacco comment: up to 3 cigs/day; cutting back.  Substance Use Topics  . Alcohol use: Yes    Comment: occasional  . Drug use: No     Allergies   Benicar [olmesartan]; Ace inhibitors; and Angiotensin receptor blockers   Review of  Systems Review of Systems  Musculoskeletal: Positive for arthralgias and joint swelling.  All other systems reviewed and are negative.    Physical Exam Updated Vital Signs BP 137/80 (BP Location: Left Arm)   Pulse 93   Temp 97.8 F (36.6 C) (Oral)   Resp 18   SpO2 96%   Physical Exam Constitutional:      Appearance: He is well-developed. He is not toxic-appearing.  HENT:     Head: Normocephalic.     Right Ear: External ear normal.     Left Ear: External ear normal.     Nose: Nose normal.  Eyes:     Conjunctiva/sclera: Conjunctivae normal.  Neck:     Musculoskeletal: Full passive range of motion without pain.  Cardiovascular:     Rate and Rhythm: Normal rate.  Pulmonary:     Effort: Pulmonary effort is normal. No tachypnea or respiratory distress.  Musculoskeletal: Normal range of motion.        General: Tenderness present.     Comments: Right hand: Mild focal edema and tenderness to the palmar aspect of the proximal phalanx of the right ring finger.  No overlying skin erythema, warmth, lesions or rash.  Edema and tenderness mild to the palmar aspect of the MCP of the right ring finger.  No focal tenderness to the metacarpals, wrist.  Full range of motion of the digits with mild pain locally to the right ring finger.  No track marks.  Skin:    General: Skin is warm and dry.     Capillary Refill: Capillary refill takes less than 2 seconds.  Neurological:     Mental Status: He is alert and oriented to person, place, and time.  Psychiatric:        Behavior: Behavior normal.        Thought Content: Thought content normal.      ED Treatments / Results  Labs (all labs ordered are listed, but only abnormal results are displayed) Labs Reviewed - No data to display  EKG None  Radiology No results found.  Procedures Procedures (including critical care time)  Medications Ordered in ED Medications - No data to display   Initial Impression / Assessment and Plan / ED  Course  I have reviewed the triage vital signs and the nursing notes.  Pertinent labs & imaging results that were available during my care of the patient were reviewed by me and considered in my medical decision making (see chart for details).        Symptoms and exam most consistent with mild, localized reaction to an insect bite.  Patient denies IV drug use.  No documented history of IV drug use.  There is no overlying track marks.  He has very mild focal edema and tenderness to the region where he felt the sting.  No overlying cellulitis or abscess.  History not consistent with septic arthritis or gout. Extremities otherwise neurovascularly intact.  Afebrile.  I do not see further indication for emergent lab work or imaging.  I do not see clinical signs of superimposed infection and will defer antibiotics at this time.  Will discharge with NSAIDs, ice, elevate.  Instructed to monitor for signs of superimposed infection.  Return precautions were given.  Patient and wife are comfortable with this plan.  Final Clinical Impressions(s) / ED Diagnoses   Final diagnoses:  Insect bite of right hand with local reaction, initial encounter    ED Discharge Orders    None       Arlean Hopping 07/17/18 2351    Palumbo, April, MD 07/18/18 [phone]

## 2018-07-17 NOTE — Discharge Instructions (Signed)
You were seen in the ER for right hand pain and swelling after you noticed a stinging sensation outside.  Symptoms are probably from a localized reaction to insect bite.  At this time there are no complicating symptoms.  Ice.  Elevate.  For pain you can take 600 mg of ibuprofen every 6-8 hours, for more pain control you can take 500 to 1000 mg of acetaminophen every 6-8 hours.  You may develop some redness and itching.  You can take over-the-counter Benadryl for this.  Monitor and return to ER for signs of infection including increased redness, swelling, warmth, pus, lesions.

## 2018-07-17 NOTE — ED Triage Notes (Signed)
Pt reports he was picking up branches in the yard today and felt something bite his right hand 4th finger, did not see anything. Now having swelling and pain in the right hand.

## 2018-07-19 NOTE — Assessment & Plan Note (Signed)
Referral to drug counseling made by his PCP. With medicaid he will need to be in formal counseling/recovery/addiction support group/service to qualify for treatment.

## 2018-07-19 NOTE — Progress Notes (Signed)
Patient did not show for appointment - no charge

## 2018-07-20 ENCOUNTER — Telehealth: Payer: Self-pay | Admitting: Pharmacy Technician

## 2018-07-20 ENCOUNTER — Ambulatory Visit (INDEPENDENT_AMBULATORY_CARE_PROVIDER_SITE_OTHER): Payer: Medicaid Other | Admitting: Infectious Diseases

## 2018-07-20 DIAGNOSIS — F1911 Other psychoactive substance abuse, in remission: Secondary | ICD-10-CM

## 2018-07-20 DIAGNOSIS — B182 Chronic viral hepatitis C: Secondary | ICD-10-CM

## 2018-07-20 MED FILL — NAPROXEN 375 MG TABLET: 375 | 30 days supply | Qty: 60 | Fill #1

## 2018-07-20 NOTE — Telephone Encounter (Signed)
[facility] Patient Advocate Encounter    Findings of the benefits investigation conducted this morning via test claims for the patient's upcoming appointment on 07/20/2018 @ 09:00am are as follows:   Insurance: [place] [employer]- active status  Estimated copay amount will be determined once labs return and medication selected Prior Authorization: will begin insurance process once medication is prescribed  [facility] Patient Advocate will follow up once patient arrives for their appointment.  [name], CPhT Specialty Pharmacy Patient [facility] Phone: [phone] Fax: [phone] 07/20/2018 8:12 AM

## 2018-07-24 ENCOUNTER — Telehealth (HOSPITAL_COMMUNITY): Payer: Self-pay | Admitting: *Deleted

## 2018-07-24 NOTE — Telephone Encounter (Signed)
Left VM for pt to call back. Need to r/s due to covid 19.

## 2018-07-25 ENCOUNTER — Ambulatory Visit (HOSPITAL_COMMUNITY)
Admission: RE | Admit: 2018-07-25 | Discharge: 2018-07-25 | Disposition: A | Discharge status: Home / Self Care | Payer: Medicaid Other | Source: Ambulatory Visit | Attending: Internal Medicine | Admitting: Internal Medicine

## 2018-07-25 ENCOUNTER — Other Ambulatory Visit: Payer: Self-pay

## 2018-07-25 ENCOUNTER — Telehealth: Payer: Self-pay | Admitting: General Practice

## 2018-07-25 ENCOUNTER — Encounter (HOSPITAL_COMMUNITY): Payer: Self-pay | Admitting: Internal Medicine

## 2018-07-25 VITALS — BP 144/108 | HR 87 | Ht 74.5 in | Wt 201.4 lb

## 2018-07-25 DIAGNOSIS — F17210 Nicotine dependence, cigarettes, uncomplicated: Secondary | ICD-10-CM | POA: Diagnosis not present

## 2018-07-25 DIAGNOSIS — F1410 Cocaine abuse, uncomplicated: Secondary | ICD-10-CM | POA: Insufficient documentation

## 2018-07-25 DIAGNOSIS — E119 Type 2 diabetes mellitus without complications: Secondary | ICD-10-CM | POA: Diagnosis not present

## 2018-07-25 DIAGNOSIS — Z79899 Other long term (current) drug therapy: Secondary | ICD-10-CM | POA: Diagnosis not present

## 2018-07-25 DIAGNOSIS — Z7982 Long term (current) use of aspirin: Secondary | ICD-10-CM | POA: Insufficient documentation

## 2018-07-25 DIAGNOSIS — Z833 Family history of diabetes mellitus: Secondary | ICD-10-CM | POA: Insufficient documentation

## 2018-07-25 DIAGNOSIS — I428 Other cardiomyopathies: Secondary | ICD-10-CM | POA: Diagnosis not present

## 2018-07-25 DIAGNOSIS — R531 Weakness: Secondary | ICD-10-CM | POA: Diagnosis not present

## 2018-07-25 DIAGNOSIS — B1920 Unspecified viral hepatitis C without hepatic coma: Secondary | ICD-10-CM | POA: Diagnosis not present

## 2018-07-25 DIAGNOSIS — I5032 Chronic diastolic (congestive) heart failure: Secondary | ICD-10-CM

## 2018-07-25 DIAGNOSIS — Z888 Allergy status to other drugs, medicaments and biological substances status: Secondary | ICD-10-CM | POA: Insufficient documentation

## 2018-07-25 DIAGNOSIS — I5022 Chronic systolic (congestive) heart failure: Secondary | ICD-10-CM

## 2018-07-25 DIAGNOSIS — I110 Hypertensive heart disease with heart failure: Secondary | ICD-10-CM | POA: Diagnosis not present

## 2018-07-25 DIAGNOSIS — Z8249 Family history of ischemic heart disease and other diseases of the circulatory system: Secondary | ICD-10-CM | POA: Insufficient documentation

## 2018-07-25 DIAGNOSIS — I10 Essential (primary) hypertension: Secondary | ICD-10-CM

## 2018-07-25 DIAGNOSIS — Z8673 Personal history of transient ischemic attack (TIA), and cerebral infarction without residual deficits: Secondary | ICD-10-CM | POA: Insufficient documentation

## 2018-07-25 DIAGNOSIS — Z7984 Long term (current) use of oral hypoglycemic drugs: Secondary | ICD-10-CM | POA: Insufficient documentation

## 2018-07-25 DIAGNOSIS — E785 Hyperlipidemia, unspecified: Secondary | ICD-10-CM | POA: Diagnosis not present

## 2018-07-25 LAB — BASIC METABOLIC PANEL
Anion gap: 9 (ref 5–15)
BUN: 10 mg/dL (ref 6–20)
CHLORIDE: 104 mmol/L (ref 98–111)
CO2: 23 mmol/L (ref 22–32)
Calcium: 9.1 mg/dL (ref 8.9–10.3)
Creatinine, Ser: 0.89 mg/dL (ref 0.61–1.24)
GFR calc Af Amer: >60 mL/min (ref >60)
GFR calc non Af Amer: >60 mL/min (ref >60)
Glucose, Bld: 145 mg/dL — ABNORMAL HIGH (ref 70–99)
Potassium: 3.7 mmol/L (ref 3.5–5.1)
Sodium: 136 mmol/L (ref 135–145)

## 2018-07-25 MED ORDER — ISOSORB DINITRATE-HYDRALAZINE 20-37.5 MG PO TABS: 0.5000 | ORAL_TABLET | Freq: Three times a day (TID) | ORAL | 6 refills | Status: DC

## 2018-07-25 MED FILL — BIDIL TABLET: 20-37.5 | 30 days supply | Qty: 45 | Fill #0

## 2018-07-25 NOTE — Patient Instructions (Signed)
Start Bidil 1/2 tab Three times a day   Labs done today  Please follow up with our heart failure pharmacist in 6-8 weeks  Your physician recommends that you schedule a follow-up appointment in: 3 months with Dr Haroldine Laws

## 2018-07-25 NOTE — Progress Notes (Signed)
ADVANCED HF CLINIC CONSULT NOTE  Referring Physician: Dr. Joya Gaskins Primary Care: Primary Cardiologist:  HPI:  58 y/o male with multiple medical problems including substance abuse (tobacco, cocaine), HCV, HTN, HL, DM2, previous TIA and   Has h/o systolic HF dating back to 2011. Echo 02/2010 with EF 50-55%  Admitted to [facility] 9/19 with HF. EF 25-30%. Had cath. No CAD.   Admitted to [facility] 12/19 with recurrent HF and HTN. Saw Dr. Joya Gaskins as outpatient and echo done 07/03/18 EF25-30%   Here with his wife. Says he feels pretty good. Can walk over a mile without a problem. Denies DOE, orthopnea or PND. Walks with a  Limp as R-side is weak. No edema. Smoking 1-2 cigarettes per day. Drinks two 24 ounce beers per day. Says he hasn't use cocaine in several weeks. No CP or pressure.   Had angioedema with Benicar   Review of Systems: [y] = yes, [ ]  = no   General: Weight gain [ ] ; Weight loss [ ] ; Anorexia [ ] ; Fatigue [ ] ; Fever [ ] ; Chills [ ] ; Weakness [ ]   Cardiac: Chest pain/pressure [ ] ; Resting SOB [ ] ; Exertional SOB [ ] ; Orthopnea [ ] ; Pedal Edema [ ] ; Palpitations [ ] ; Syncope [ ] ; Presyncope [ ] ; Paroxysmal nocturnal dyspnea[ ]   Pulmonary: Cough [ ] ; Wheezing[ ] ; Hemoptysis[ ] ; Sputum [ ] ; Snoring [ ]   GI: Vomiting[ ] ; Dysphagia[ ] ; Melena[ ] ; Hematochezia [ ] ; Heartburn[ ] ; Abdominal pain [ ] ; Constipation [ ] ; Diarrhea [ ] ; BRBPR [ ]   GU: Hematuria[ ] ; Dysuria [ ] ; Nocturia[ ]   Vascular: Pain in legs with walking [ ] ; Pain in feet with lying flat [ ] ; Non-healing sores [ ] ; Stroke [ y]; TIA [ ] ; Slurred speech [ ] ;  Neuro: Headaches[ ] ; Vertigo[ ] ; Seizures[ ] ; Paresthesias[ ] ;Blurred vision [ ] ; Diplopia [ ] ; Vision changes [ ]   Ortho/Skin: Arthritis Blue.Reese ]; Joint pain Blue.Reese ]; Muscle pain [ ] ; Joint swelling [ ] ; Back Pain [ ] ; Rash [ ]   Psych: Depression[ ] ; Anxiety[ ]   Heme: Bleeding problems [ ] ; Clotting disorders [ ] ; Anemia [ ]   Endocrine: Diabetes Blue.Reese ]; Thyroid dysfunction[ ]    Past Medical History:  Diagnosis Date  . Diabetes mellitus without complication ([place])   . Hepatitis C   . History of CVA (cerebrovascular accident) 02/2010   Left periventricular subcortical ischemic infarction // Residual RUE and RLE weakness  . History of depression    surrounding original stroke - has since resolved (04/2012)  . Hyperlipidemia   . Hypertension   . Internal carotid artery stenosis    BL and mild per CT angiogram (05/2010)  . Tobacco abuse     Current Outpatient Medications  Medication Sig Dispense Refill  . amLODipine (NORVASC) 10 MG tablet Take 1 tablet (10 mg total) by mouth daily. 30 tablet 2  . atorvastatin (LIPITOR) 80 MG tablet Take 1 tablet (80 mg total) by mouth daily. 30 tablet 4  . carvedilol (COREG) 3.125 MG tablet Take 1 tablet (3.125 mg total) by mouth 2 (two) times daily with a meal. 60 tablet 4  . clopidogrel (PLAVIX) 75 MG tablet Take 1 tablet (75 mg total) by mouth daily. 30 tablet 3  . metFORMIN (GLUCOPHAGE) 500 MG tablet Take 1 tablet (500 mg total) by mouth 2 (two) times daily with a meal. 60 tablet 4  . naproxen (NAPROSYN) 375 MG tablet Take 1 tablet (375 mg total) by mouth 2 (two) times daily as  needed for moderate pain. 60 tablet 2  . spironolactone (ALDACTONE) 25 MG tablet Take 1 tablet (25 mg total) by mouth daily. 30 tablet 4  . aspirin 81 MG tablet Take 1 tablet (81 mg total) by mouth daily. (Patient not taking: Reported on 07/25/2018) 30 tablet 5   No current facility-administered medications for this encounter.     Allergies  Allergen Reactions  . Benicar [Olmesartan] Anaphylaxis and Swelling    Patient cannot name the site of swelling (??)  . Ace Inhibitors Swelling    Patient cannot name the site of swelling (??)  . Angiotensin Receptor Blockers Swelling    Patient cannot name the site of swelling (??)      Social History   Socioeconomic History  . Marital status: Married    Spouse name: Not on file  . Number of children:  1  . Years of education: 11th grade  . Highest education level: Not on file  Occupational History  . Occupation: On disability    Comment: previously worked for [employer] until his stroke in 2011.   Social Needs  . Financial resource strain: Not on file  . Food insecurity:    Worry: Not on file    Inability: Not on file  . Transportation needs:    Medical: Not on file    Non-medical: Not on file  Tobacco Use  . Smoking status: Current Some Day Smoker    Packs/day: 0.30    Years: 31.00    Pack years: 9.30    Types: Cigarettes    Start date: 04/25/1981  . Smokeless tobacco: Never Used  . Tobacco comment: up to 3 cigs/day; cutting back.  Substance and Sexual Activity  . Alcohol use: Yes    Comment: occasional  . Drug use: No  . Sexual activity: Yes  Lifestyle  . Physical activity:    Days per week: Not on file    Minutes per session: Not on file  . Stress: Not on file  Relationships  . Social connections:    Talks on phone: Not on file    Gets together: Not on file    Attends religious service: Not on file    Active member of club or organization: Not on file    Attends meetings of clubs or organizations: Not on file    Relationship status: Not on file  . Intimate partner violence:    Fear of current or ex partner: Not on file    Emotionally abused: Not on file    Physically abused: Not on file    Forced sexual activity: Not on file  Other Topics Concern  . Not on file  Social History Narrative   Lives in [place] with wife.   Has been incarcerated multiple times, last time was in 2012 for 8 months.      Family History  Problem Relation Age of Onset  . Diabetes Mother   . Heart failure Mother   . Hypertension Mother   . Hyperlipidemia Mother   . Glaucoma Mother   . Lung cancer Father   . Hypertension Brother   . Diabetes Sister   . Arthritis Sister     Vitals:   07/25/18 1058  BP: (!) 144/108  Pulse: 87  SpO2: 98%  Weight: 91.4 kg (201 lb  6.4 oz)  Height: 6' 2.5" (1.892 m)    PHYSICAL EXAM: General:  Well appearing. No respiratory difficulty HEENT: normal Neck: supple. no JVD. Carotids 2+ bilat; no bruits.  No lymphadenopathy or thryomegaly appreciated. Cor: PMI nondisplaced. Regular rate & rhythm. No rubs, gallops or murmurs. Lungs: clear Abdomen: soft, nontender, nondistended. No hepatosplenomegaly. No bruits or masses. Good bowel sounds. Extremities: no cyanosis, clubbing, rash, edema Neuro: alert & oriented x 3, cranial nerves grossly intact. moves all 4 extremities w/o difficulty mildly weak RLE. Affect pleasant.  ECG: NSR 97. RBBB LVH. (05/05/18) - Personally reviewed   ASSESSMENT & PLAN:  1. Chronic systolic HF due to NICM - Echo 9/19 EF 25-30%, Echo 2/20 EF 25-30% - Cath 9/19 [facility]. No CAD - Suspect due to HTN and substance use - NYHA I - Volume status looks good - Continue carvedilol, spiro  - No ACE/ARB/ARNI with angioedema to Benicar in past - Start Bidil 0.5 tabs tid  - Limit ETOH and substance abuse - No ICD at this point with NYHA I - Refer to [facility] for med titration  2. HTN - poorly controlled - add Bidil  3. Substance abuse - reinforced need for cessation  4. CVA - Continue DAPT and statin   5. HCV - has been referred to ID for treatment.   Glori Bickers, MD  2:11 PM

## 2018-07-25 NOTE — Telephone Encounter (Signed)
Patient came in wanting to check on the status of their disability papers states they dropped paperwork off at the time of their visit. Please follow up.

## 2018-07-26 ENCOUNTER — Ambulatory Visit: Payer: Medicaid Other | Admitting: Critical Care Medicine

## 2018-07-26 NOTE — Progress Notes (Deleted)
Subjective:    Patient ID: Ronald Marsh, male    DOB: [birth year], 58 y.o.   [name]: [id number]  58 y.o.M here for f/u of HTN ,CHF diastolic  This patient was admitted between the 28th and 06 May 2018 for acute diastolic heart failure and hypoxemia.  The patient good does give a history of using cocaine on a frequent basis.  Patient also has ongoing tobacco use.  The patient had previously been seen for acute bronchitis and was given a course of azithromycin with some improvement in the fever and productive cough however the patient return to the emergency department on 05/05/2018 with acute diastolic heart failure.  The patient was given intravenous diuretics and his blood pressure medicines were adjusted and then he was sent home the following day.  Note there is also a prior history of stroke involving the right side of the patient  The patient is here today to establish for primary care and transition of care follow-up.  Patient does note chronic pain in the lower back.  He also complains of pain in the feet area.  He has run out of some of the medications given at discharge.  Note the patient is less dyspneic compared to the previous admission  The patient does give a history of being positive for hepatitis C when he was in the criminal justice system several years ago  07/26/2018 Pt last seen in Feb. Since that visit has been to ED 3/10 for insect bite on hand  At last OV: History of substance abuse ([place]) History of recent cocaine use  Referral will be made to drug counseling through our licensed clinical social worker  Hyperlipidemia History of hyperlipidemia we will need to recheck lipid panel and will refill at atorvastatin  Bilateral bunions Bilateral toe fungus and bunions and a diabetic will refer to podiatry  Lumbar radiculopathy Lumbar radiculopathy without evidence for focal neurologic deficit  We will obtain lumbar films and prescribe Naprosyn twice daily  as needed 375 mg and will avoid opioids  Diabetes mellitus type 2, controlled ([place]) Type 2 diabetes well controlled with current hemoglobin A1c of 6.4  We will refill the metformin   HepC pos>>[facility] referral.  CHF low EF>>>HF clinic referral,  Podiatry referral for bunions  Wt Readings from Last 3 Encounters:  07/25/18 201 lb 6.4 oz (91.4 kg)  06/12/18 196 lb (88.9 kg)  05/06/18 193 lb 8 oz (87.8 kg)   Past Medical History:  Diagnosis Date  . Diabetes mellitus without complication ([place])   . Hepatitis C   . History of CVA (cerebrovascular accident) 02/2010   Left periventricular subcortical ischemic infarction // Residual RUE and RLE weakness  . History of depression    surrounding original stroke - has since resolved (04/2012)  . Hyperlipidemia   . Hypertension   . Internal carotid artery stenosis    BL and mild per CT angiogram (05/2010)  . Tobacco abuse      Family History  Problem Relation Age of Onset  . Diabetes Mother   . Heart failure Mother   . Hypertension Mother   . Hyperlipidemia Mother   . Glaucoma Mother   . Lung cancer Father   . Hypertension Brother   . Diabetes Sister   . Arthritis Sister      Social History   Socioeconomic History  . Marital status: Married    Spouse name: Not on file  . Number of children: 1  . Years of education: 11th grade  .  Highest education level: Not on file  Occupational History  . Occupation: On disability    Comment: previously worked for [employer] until his stroke in 2011.   Social Needs  . Financial resource strain: Not on file  . Food insecurity:    Worry: Not on file    Inability: Not on file  . Transportation needs:    Medical: Not on file    Non-medical: Not on file  Tobacco Use  . Smoking status: Current Some Day Smoker    Packs/day: 0.30    Years: 31.00    Pack years: 9.30    Types: Cigarettes    Start date: 04/25/1981  . Smokeless tobacco: Never Used  . Tobacco comment: up to 3 cigs/day;  cutting back.  Substance and Sexual Activity  . Alcohol use: Yes    Comment: occasional  . Drug use: No  . Sexual activity: Yes  Lifestyle  . Physical activity:    Days per week: Not on file    Minutes per session: Not on file  . Stress: Not on file  Relationships  . Social connections:    Talks on phone: Not on file    Gets together: Not on file    Attends religious service: Not on file    Active member of club or organization: Not on file    Attends meetings of clubs or organizations: Not on file    Relationship status: Not on file  . Intimate partner violence:    Fear of current or ex partner: Not on file    Emotionally abused: Not on file    Physically abused: Not on file    Forced sexual activity: Not on file  Other Topics Concern  . Not on file  Social History Narrative   Lives in [place] with wife.   Has been incarcerated multiple times, last time was in 2012 for 8 months.     Allergies  Allergen Reactions  . Benicar [Olmesartan] Anaphylaxis and Swelling    Patient cannot name the site of swelling (??)  . Ace Inhibitors Swelling    Patient cannot name the site of swelling (??)  . Angiotensin Receptor Blockers Swelling    Patient cannot name the site of swelling (??)     Outpatient Medications Prior to Visit  Medication Sig Dispense Refill  . amLODipine (NORVASC) 10 MG tablet Take 1 tablet (10 mg total) by mouth daily. 30 tablet 2  . aspirin 81 MG tablet Take 1 tablet (81 mg total) by mouth daily. (Patient not taking: Reported on 07/25/2018) 30 tablet 5  . atorvastatin (LIPITOR) 80 MG tablet Take 1 tablet (80 mg total) by mouth daily. 30 tablet 4  . carvedilol (COREG) 3.125 MG tablet Take 1 tablet (3.125 mg total) by mouth 2 (two) times daily with a meal. 60 tablet 4  . clopidogrel (PLAVIX) 75 MG tablet Take 1 tablet (75 mg total) by mouth daily. 30 tablet 3  . isosorbide-hydrALAZINE (BIDIL) 20-37.5 MG tablet Take 0.5 tablets by mouth 3 (three) times daily. 45  tablet 6  . metFORMIN (GLUCOPHAGE) 500 MG tablet Take 1 tablet (500 mg total) by mouth 2 (two) times daily with a meal. 60 tablet 4  . naproxen (NAPROSYN) 375 MG tablet Take 1 tablet (375 mg total) by mouth 2 (two) times daily as needed for moderate pain. 60 tablet 2  . spironolactone (ALDACTONE) 25 MG tablet Take 1 tablet (25 mg total) by mouth daily. 30 tablet 4   No  facility-administered medications prior to visit.     Review of Systems  Constitutional: Negative for diaphoresis, fatigue and fever.  HENT: Negative.   Respiratory: Negative for cough, chest tightness, shortness of breath and wheezing.   Cardiovascular: Negative for chest pain, palpitations and leg swelling.       No edema  Genitourinary: Positive for difficulty urinating. Negative for frequency, hematuria, scrotal swelling and testicular pain.  Musculoskeletal:       Hips and right sided leg pain.   Neurological: Positive for weakness. Negative for numbness.  Psychiatric/Behavioral: Negative for self-injury, sleep disturbance and suicidal ideas. The patient is not nervous/anxious.        Objective:   Physical Exam  There were no vitals filed for this visit.  Gen: Pleasant, well-nourished, in no distress,  normal affect  ENT: No lesions,  mouth clear,  oropharynx clear, no postnasal drip  Neck: No JVD, no TMG, no carotid bruits  Lungs: No use of accessory muscles, no dullness to percussion, clear without rales or rhonchi  Cardiovascular: RRR, heart sounds normal, no murmur or gallops, no peripheral edema  Abdomen: soft and NT, no HSM,  BS normal  Musculoskeletal: No deformities, no cyanosis or clubbing Bilateral toenail fungus in the lower extremities and significant bunions over the outer aspects of the right and left great toe  Neuro: alert, non focal  Skin: Warm, no lesions or rashes  No results found.  HgbA1C 6.4 CBG 169 ABI 1.3 both LE No echocardiogram in the system No lumbar films in the  system     Assessment & Plan:  I personally reviewed all images and lab data in the [facility] system as well as any outside material available during this office visit and agree with the  radiology impressions.   No problem-specific Assessment & Plan notes found for this encounter.   There are no diagnoses linked to this encounter.Note with previous history of hepatitis C in the criminal justice system will recheck hepatitis panel and give consideration to referral to infectious disease  Note ABI screen normal , doubt PVD  I had an extended discussion with the patient and or family lasting 10minutes of a 25 minute visit including: Smoking cessation and also counseling for cocaine use

## 2018-07-30 NOTE — Telephone Encounter (Signed)
Paperwork is at the front desk. Pt has an appointment on 08/01/2018. Per pt.,he will pick up from office then.

## 2018-08-01 ENCOUNTER — Encounter: Payer: Self-pay | Admitting: Critical Care Medicine

## 2018-08-01 ENCOUNTER — Other Ambulatory Visit: Payer: Self-pay

## 2018-08-01 ENCOUNTER — Ambulatory Visit: Payer: Medicaid Other | Attending: Critical Care Medicine | Admitting: Critical Care Medicine

## 2018-08-01 VITALS — BP 116/74 | HR 75 | Temp 97.5°F | Resp 16 | Wt 201.0 lb

## 2018-08-01 DIAGNOSIS — Z7902 Long term (current) use of antithrombotics/antiplatelets: Secondary | ICD-10-CM | POA: Insufficient documentation

## 2018-08-01 DIAGNOSIS — Z7982 Long term (current) use of aspirin: Secondary | ICD-10-CM | POA: Diagnosis not present

## 2018-08-01 DIAGNOSIS — Z79899 Other long term (current) drug therapy: Secondary | ICD-10-CM | POA: Diagnosis not present

## 2018-08-01 DIAGNOSIS — I10 Essential (primary) hypertension: Secondary | ICD-10-CM | POA: Diagnosis not present

## 2018-08-01 DIAGNOSIS — E1142 Type 2 diabetes mellitus with diabetic polyneuropathy: Secondary | ICD-10-CM | POA: Diagnosis not present

## 2018-08-01 DIAGNOSIS — B182 Chronic viral hepatitis C: Secondary | ICD-10-CM

## 2018-08-01 DIAGNOSIS — Z720 Tobacco use: Secondary | ICD-10-CM | POA: Diagnosis not present

## 2018-08-01 DIAGNOSIS — Z7984 Long term (current) use of oral hypoglycemic drugs: Secondary | ICD-10-CM | POA: Insufficient documentation

## 2018-08-01 DIAGNOSIS — Z8249 Family history of ischemic heart disease and other diseases of the circulatory system: Secondary | ICD-10-CM | POA: Diagnosis not present

## 2018-08-01 DIAGNOSIS — I110 Hypertensive heart disease with heart failure: Secondary | ICD-10-CM | POA: Diagnosis not present

## 2018-08-01 DIAGNOSIS — Z8673 Personal history of transient ischemic attack (TIA), and cerebral infarction without residual deficits: Secondary | ICD-10-CM | POA: Diagnosis not present

## 2018-08-01 DIAGNOSIS — E1169 Type 2 diabetes mellitus with other specified complication: Secondary | ICD-10-CM | POA: Diagnosis not present

## 2018-08-01 DIAGNOSIS — I427 Cardiomyopathy due to drug and external agent: Secondary | ICD-10-CM

## 2018-08-01 DIAGNOSIS — I5032 Chronic diastolic (congestive) heart failure: Secondary | ICD-10-CM | POA: Insufficient documentation

## 2018-08-01 DIAGNOSIS — I429 Cardiomyopathy, unspecified: Secondary | ICD-10-CM | POA: Insufficient documentation

## 2018-08-01 DIAGNOSIS — F17210 Nicotine dependence, cigarettes, uncomplicated: Secondary | ICD-10-CM | POA: Insufficient documentation

## 2018-08-01 DIAGNOSIS — Z801 Family history of malignant neoplasm of trachea, bronchus and lung: Secondary | ICD-10-CM | POA: Diagnosis not present

## 2018-08-01 DIAGNOSIS — E785 Hyperlipidemia, unspecified: Secondary | ICD-10-CM | POA: Insufficient documentation

## 2018-08-01 DIAGNOSIS — Z833 Family history of diabetes mellitus: Secondary | ICD-10-CM | POA: Insufficient documentation

## 2018-08-01 DIAGNOSIS — E7800 Pure hypercholesterolemia, unspecified: Secondary | ICD-10-CM | POA: Diagnosis not present

## 2018-08-01 DIAGNOSIS — Z888 Allergy status to other drugs, medicaments and biological substances status: Secondary | ICD-10-CM | POA: Diagnosis not present

## 2018-08-01 LAB — GLUCOSE, POCT (MANUAL RESULT ENTRY): POC Glucose: 110 mg/dl — AB (ref 70–99)

## 2018-08-01 NOTE — Patient Instructions (Signed)
No change in your medications  A referral to the hepatitis C clinic will be made  Please schedule an eye exam for your diabetes  Urine for microalbumin will be obtained  Return 2 months to establish for primary care in our clinic

## 2018-08-01 NOTE — Progress Notes (Signed)
Subjective:    Patient ID: Ronald Marsh, male    DOB: 10-25-1960, 58 y.o.   [name]: [id number]  58 y.o.M here for f/u of HTN ,CHF diastolic  This patient was admitted between the 28th and 06 May 2018 for acute diastolic heart failure and hypoxemia.  The patient good does give a history of using cocaine on a frequent basis.  Patient also has ongoing tobacco use.  The patient had previously been seen for acute bronchitis and was given a course of azithromycin with some improvement in the fever and productive cough however the patient return to the emergency department on 05/05/2018 with acute diastolic heart failure.  The patient was given intravenous diuretics and his blood pressure medicines were adjusted and then he was sent home the following day.  Note there is also a prior history of stroke involving the right side of the patient  The patient is here today to establish for primary care and transition of care follow-up.  Patient does note chronic pain in the lower back.  He also complains of pain in the feet area.  He has run out of some of the medications given at discharge.  Note the patient is less dyspneic compared to the previous admission  The patient does give a history of being positive for hepatitis C when he was in the criminal justice system several years ago  08/01/2018 Since the last visit the patient has been into the cardiology office is now established with the advanced heart failure clinic.  The patient has had medication adjustments.  The patient has refills on all of his current medications continue to be compliant with his cardiac medicines. The patient also has active hepatitis C with a very high viral load and failed to complete his referral to infectious disease.  He is willing to attempt again to make an appointment with the hepatitis C clinic. The patient's liver functions were only mildly elevated at the last office visit.  Currently the patient has no  respiratory or cardiac complaints at this visit.    Wt Readings from Last 3 Encounters:  08/01/18 201 lb (91.2 kg)  07/25/18 201 lb 6.4 oz (91.4 kg)  06/12/18 196 lb (88.9 kg)   Past Medical History:  Diagnosis Date  . Diabetes mellitus without complication ([place])   . Hepatitis C   . History of CVA (cerebrovascular accident) 02/2010   Left periventricular subcortical ischemic infarction // Residual RUE and RLE weakness  . History of depression    surrounding original stroke - has since resolved (04/2012)  . Hyperlipidemia   . Hypertension   . Internal carotid artery stenosis    BL and mild per CT angiogram (05/2010)  . Tobacco abuse      Family History  Problem Relation Age of Onset  . Diabetes Mother   . Heart failure Mother   . Hypertension Mother   . Hyperlipidemia Mother   . Glaucoma Mother   . Lung cancer Father   . Hypertension Brother   . Diabetes Sister   . Arthritis Sister      Social History   Socioeconomic History  . Marital status: Married    Spouse name: Not on file  . Number of children: 1  . Years of education: 11th grade  . Highest education level: Not on file  Occupational History  . Occupation: On disability    Comment: previously worked for [employer] until his stroke in 2011.   Social Needs  . Financial  resource strain: Not on file  . Food insecurity:    Worry: Not on file    Inability: Not on file  . Transportation needs:    Medical: Not on file    Non-medical: Not on file  Tobacco Use  . Smoking status: Current Some Day Smoker    Packs/day: 0.30    Years: 31.00    Pack years: 9.30    Types: Cigarettes    Start date: 04/25/1981  . Smokeless tobacco: Never Used  . Tobacco comment: up to 3 cigs/day; cutting back.  Substance and Sexual Activity  . Alcohol use: Yes    Comment: occasional  . Drug use: No  . Sexual activity: Yes  Lifestyle  . Physical activity:    Days per week: Not on file    Minutes per session: Not on file   . Stress: Not on file  Relationships  . Social connections:    Talks on phone: Not on file    Gets together: Not on file    Attends religious service: Not on file    Active member of club or organization: Not on file    Attends meetings of clubs or organizations: Not on file    Relationship status: Not on file  . Intimate partner violence:    Fear of current or ex partner: Not on file    Emotionally abused: Not on file    Physically abused: Not on file    Forced sexual activity: Not on file  Other Topics Concern  . Not on file  Social History Narrative   Lives in [place] with wife.   Has been incarcerated multiple times, last time was in 2012 for 8 months.     Allergies  Allergen Reactions  . Benicar [Olmesartan] Anaphylaxis and Swelling    Patient cannot name the site of swelling (??)  . Ace Inhibitors Swelling    Patient cannot name the site of swelling (??)  . Angiotensin Receptor Blockers Swelling    Patient cannot name the site of swelling (??)     Outpatient Medications Prior to Visit  Medication Sig Dispense Refill  . amLODipine (NORVASC) 10 MG tablet Take 1 tablet (10 mg total) by mouth daily. 30 tablet 2  . aspirin 81 MG tablet Take 1 tablet (81 mg total) by mouth daily. 30 tablet 5  . atorvastatin (LIPITOR) 80 MG tablet Take 1 tablet (80 mg total) by mouth daily. 30 tablet 4  . carvedilol (COREG) 3.125 MG tablet Take 1 tablet (3.125 mg total) by mouth 2 (two) times daily with a meal. 60 tablet 4  . clopidogrel (PLAVIX) 75 MG tablet Take 1 tablet (75 mg total) by mouth daily. 30 tablet 3  . isosorbide-hydrALAZINE (BIDIL) 20-37.5 MG tablet Take 0.5 tablets by mouth 3 (three) times daily. 45 tablet 6  . metFORMIN (GLUCOPHAGE) 500 MG tablet Take 1 tablet (500 mg total) by mouth 2 (two) times daily with a meal. 60 tablet 4  . naproxen (NAPROSYN) 375 MG tablet Take 1 tablet (375 mg total) by mouth 2 (two) times daily as needed for moderate pain. 60 tablet 2  .  spironolactone (ALDACTONE) 25 MG tablet Take 1 tablet (25 mg total) by mouth daily. 30 tablet 4   No facility-administered medications prior to visit.     Review of Systems  Constitutional: Negative for diaphoresis, fatigue and fever.  HENT: Negative.   Respiratory: Negative for cough, chest tightness, shortness of breath and wheezing.   Cardiovascular: Negative for  chest pain, palpitations and leg swelling.       No edema  Genitourinary: Positive for difficulty urinating. Negative for frequency, hematuria, scrotal swelling and testicular pain.  Musculoskeletal:       Hips and right sided leg pain.   Neurological: Positive for weakness. Negative for numbness.  Psychiatric/Behavioral: Negative for self-injury, sleep disturbance and suicidal ideas. The patient is not nervous/anxious.        Objective:   Physical Exam   Vitals:   08/01/18 0858  BP: 116/74  Pulse: 75  Resp: 16  Temp: (!) 97.5 F (36.4 C)  TempSrc: Oral  SpO2: 99%  Weight: 201 lb (91.2 kg)    Gen: Pleasant, well-nourished, in no distress,  normal affect  ENT: No lesions,  mouth clear,  oropharynx clear, no postnasal drip  Neck: No JVD, no TMG, no carotid bruits  Lungs: No use of accessory muscles, no dullness to percussion, clear without rales or rhonchi  Cardiovascular: RRR, heart sounds normal, no murmur or gallops, no peripheral edema  Abdomen: soft and NT, no HSM,  BS normal  Musculoskeletal: No deformities, no cyanosis or clubbing  Neuro: alert, non focal  Skin: Warm, no lesions or rashes     Assessment & Plan:  I personally reviewed all images and lab data in the [facility] system as well as any outside material available during this office visit and agree with the  radiology impressions.   Hypertension Hypertension with good control on current medication profile  Note patient is now on BiDil taking this 1/2 tablet 3 times daily per cardiology with good control of blood pressure and cardiac  failure  Cardiomyopathy ([place]) Cardiomyopathy both systolic and diastolic newly diagnosed with ejection fraction 25% with diffuse left ventricular hypokinesis.  The patient is now compensated with chronic heart failure.  The patient is recommended to continue to follow-up with the cardiology clinic and will maintain Coreg, spironolactone, BiDil, and amlodipine  Chronic hepatitis C without hepatic coma ([place]) Chronic hepatitis C with increased viral load  Will refer back to infectious disease for evaluation and treatment  Diabetes mellitus type 2, controlled ([place]) Type 2 diabetes with excellent control on metformin Glucose within range today normal  Will obtain a microalbumin of the urine and recommended the patient obtain an eye exam  Will maintain Lipitor  Hyperlipidemia Hyperlipidemia associated with diabetes will maintain Lipitor  Tobacco abuse The patient was continually counseled for smoking cessation

## 2018-08-01 NOTE — Assessment & Plan Note (Signed)
Cardiomyopathy both systolic and diastolic newly diagnosed with ejection fraction 25% with diffuse left ventricular hypokinesis.  The patient is now compensated with chronic heart failure.  The patient is recommended to continue to follow-up with the cardiology clinic and will maintain Coreg, spironolactone, BiDil, and amlodipine

## 2018-08-01 NOTE — Assessment & Plan Note (Signed)
The patient was continually counseled for smoking cessation

## 2018-08-01 NOTE — Assessment & Plan Note (Signed)
Hyperlipidemia associated with diabetes will maintain Lipitor

## 2018-08-01 NOTE — Progress Notes (Signed)
Referral per Dr. Joya Gaskins-   CBG- 110

## 2018-08-01 NOTE — Assessment & Plan Note (Signed)
Type 2 diabetes with excellent control on metformin Glucose within range today normal  Will obtain a microalbumin of the urine and recommended the patient obtain an eye exam  Will maintain Lipitor

## 2018-08-01 NOTE — Assessment & Plan Note (Signed)
Hypertension with good control on current medication profile  Note patient is now on BiDil taking this 1/2 tablet 3 times daily per cardiology with good control of blood pressure and cardiac failure

## 2018-08-01 NOTE — Assessment & Plan Note (Signed)
Chronic hepatitis C with increased viral load  Will refer back to infectious disease for evaluation and treatment

## 2018-08-02 ENCOUNTER — Telehealth: Payer: Self-pay | Admitting: *Deleted

## 2018-08-02 LAB — MICROALBUMIN, URINE: Microalbumin, Urine: 23.3 ug/mL

## 2018-08-02 NOTE — Telephone Encounter (Signed)
-----   Message from Elsie Stain, MD sent at 08/02/2018 10:16 AM EDT ----- Leodis Sias, can you call mr Scheel and let him know he has low amount of protein in urine which is good.

## 2018-08-02 NOTE — Telephone Encounter (Signed)
MA unable to reach patient due to no answer and no voice mail option.

## 2018-08-18 MED FILL — SPIRONOLACTONE 25 MG TABLET: 25 | 30 days supply | Qty: 30 | Fill #2

## 2018-08-18 MED FILL — NAPROXEN 375 MG TABLET: 375 | 30 days supply | Qty: 60 | Fill #2

## 2018-08-18 MED FILL — AMLODIPINE BESYLATE 10 MG T: 10 | 30 days supply | Qty: 30 | Fill #2

## 2018-08-18 MED FILL — ATORVASTATIN 80 MG TABLET: 80 | 30 days supply | Qty: 30 | Fill #2

## 2018-08-18 MED FILL — CLOPIDOGREL 75 MG TABLET: 75 | 30 days supply | Qty: 30 | Fill #2

## 2018-08-18 MED FILL — CARVEDILOL 3.125 MG TABLET: 3.125 | 30 days supply | Qty: 60 | Fill #2

## 2018-08-29 ENCOUNTER — Encounter: Payer: Medicaid Other | Admitting: Family

## 2018-08-29 NOTE — Progress Notes (Deleted)
Subjective:    Patient ID: Ronald Marsh, male    DOB: Mar 17, 1961, 58 y.o.   [name]: [id number]  No chief complaint on file.   HPI:  Ronald Marsh is a 58 y.o. male with history of hypertension, depression, diabetes and history of substance abuse who presents today for evaluation and treatment of Hepatitis C.  [name] was initially diagnosed with Hepatitis C   Allergies  Allergen Reactions  . Benicar [Olmesartan] Anaphylaxis and Swelling    Patient cannot name the site of swelling (??)  . Ace Inhibitors Swelling    Patient cannot name the site of swelling (??)  . Angiotensin Receptor Blockers Swelling    Patient cannot name the site of swelling (??)      Outpatient Medications Prior to Visit  Medication Sig Dispense Refill  . amLODipine (NORVASC) 10 MG tablet Take 1 tablet (10 mg total) by mouth daily. 30 tablet 2  . aspirin 81 MG tablet Take 1 tablet (81 mg total) by mouth daily. 30 tablet 5  . atorvastatin (LIPITOR) 80 MG tablet Take 1 tablet (80 mg total) by mouth daily. 30 tablet 4  . carvedilol (COREG) 3.125 MG tablet Take 1 tablet (3.125 mg total) by mouth 2 (two) times daily with a meal. 60 tablet 4  . clopidogrel (PLAVIX) 75 MG tablet Take 1 tablet (75 mg total) by mouth daily. 30 tablet 3  . isosorbide-hydrALAZINE (BIDIL) 20-37.5 MG tablet Take 0.5 tablets by mouth 3 (three) times daily. 45 tablet 6  . metFORMIN (GLUCOPHAGE) 500 MG tablet Take 1 tablet (500 mg total) by mouth 2 (two) times daily with a meal. 60 tablet 4  . naproxen (NAPROSYN) 375 MG tablet Take 1 tablet (375 mg total) by mouth 2 (two) times daily as needed for moderate pain. 60 tablet 2  . spironolactone (ALDACTONE) 25 MG tablet Take 1 tablet (25 mg total) by mouth daily. 30 tablet 4   No facility-administered medications prior to visit.      Past Medical History:  Diagnosis Date  . Diabetes mellitus without complication ([place])   . Hepatitis C   . History of CVA (cerebrovascular accident)  02/2010   Left periventricular subcortical ischemic infarction // Residual RUE and RLE weakness  . History of depression    surrounding original stroke - has since resolved (04/2012)  . Hyperlipidemia   . Hypertension   . Internal carotid artery stenosis    BL and mild per CT angiogram (05/2010)  . Tobacco abuse       No past surgical history on file.    Family History  Problem Relation Age of Onset  . Diabetes Mother   . Heart failure Mother   . Hypertension Mother   . Hyperlipidemia Mother   . Glaucoma Mother   . Lung cancer Father   . Hypertension Brother   . Diabetes Sister   . Arthritis Sister       Social History   Socioeconomic History  . Marital status: Married    Spouse name: Not on file  . Number of children: 1  . Years of education: 11th grade  . Highest education level: Not on file  Occupational History  . Occupation: On disability    Comment: previously worked for [employer] until his stroke in 2011.   Social Needs  . Financial resource strain: Not on file  . Food insecurity:    Worry: Not on file    Inability: Not on file  . Transportation needs:  Medical: Not on file    Non-medical: Not on file  Tobacco Use  . Smoking status: Current Some Day Smoker    Packs/day: 0.30    Years: 31.00    Pack years: 9.30    Types: Cigarettes    Start date: 04/25/1981  . Smokeless tobacco: Never Used  . Tobacco comment: up to 3 cigs/day; cutting back.  Substance and Sexual Activity  . Alcohol use: Yes    Comment: occasional  . Drug use: No  . Sexual activity: Yes  Lifestyle  . Physical activity:    Days per week: Not on file    Minutes per session: Not on file  . Stress: Not on file  Relationships  . Social connections:    Talks on phone: Not on file    Gets together: Not on file    Attends religious service: Not on file    Active member of club or organization: Not on file    Attends meetings of clubs or organizations: Not on file     Relationship status: Not on file  . Intimate partner violence:    Fear of current or ex partner: Not on file    Emotionally abused: Not on file    Physically abused: Not on file    Forced sexual activity: Not on file  Other Topics Concern  . Not on file  Social History Narrative   Lives in [place] with wife.   Has been incarcerated multiple times, last time was in 2012 for 8 months.      Review of Systems     Objective:    There were no vitals taken for this visit. Nursing note and vital signs reviewed.  Physical Exam      Assessment & Plan:   Problem List Items Addressed This Visit    None       I am having Kenn File maintain his spironolactone, metFORMIN, clopidogrel, carvedilol, atorvastatin, aspirin, amLODipine, naproxen, and isosorbide-hydrALAZINE.   No orders of the defined types were placed in this encounter.    Follow-up: No follow-ups on file.    Terri Piedra, MSN, FNP-C Nurse Practitioner [facility] Office phone: [phone] Pager: [facility] number: [phone]

## 2018-08-29 NOTE — Telephone Encounter (Addendum)
[facility] Patient Advocate Encounter  Insurance verification completed.    The patient is insured through [place] [employer] and has a $3 copay.  Demitra Danley E. Kailei Cowens, CPhT [facility] Patient Advocate [facility] Phone: [phone] Fax:  [phone]   

## 2018-09-03 ENCOUNTER — Telehealth (HOSPITAL_COMMUNITY): Payer: Self-pay | Admitting: Pharmacist

## 2018-09-03 ENCOUNTER — Inpatient Hospital Stay (HOSPITAL_COMMUNITY): Admission: RE | Admit: 2018-09-03 | Payer: Medicaid Other | Source: Ambulatory Visit

## 2018-09-03 NOTE — Telephone Encounter (Signed)
Called to do tele visit during COVID restrictions.  Wife states patient is not home and cell # goes to [employer]m without ability to leave message Wife to have patient call clinic if/when patient returns home.    Bonnita Nasuti Pharm.D. CPP, BCPS Clinical Pharmacist [phone] 09/03/2018 2:03 PM

## 2018-09-12 ENCOUNTER — Other Ambulatory Visit: Payer: Self-pay | Admitting: Critical Care Medicine

## 2018-09-19 MED FILL — metFORMIN HCL 500 MG TABS: 500 | 30 days supply | Qty: 60 | Fill #2

## 2018-09-19 MED FILL — CARVEDILOL 3.125 MG TABLET: 3.125 | 30 days supply | Qty: 60 | Fill #3

## 2018-09-24 MED FILL — BIDIL TABLET: 20-37.5 | 30 days supply | Qty: 45 | Fill #1

## 2018-09-24 MED FILL — ATORVASTATIN 80 MG TABLET: 80 | 60 days supply | Qty: 60 | Fill #3

## 2018-09-24 MED FILL — CLOPIDOGREL 75 MG TABLET: 75 | 30 days supply | Qty: 30 | Fill #3

## 2018-09-24 MED FILL — SPIRONOLACTONE 25 MG TABLET: 25 | 60 days supply | Qty: 60 | Fill #3

## 2018-10-05 ENCOUNTER — Other Ambulatory Visit: Payer: Self-pay

## 2018-10-05 ENCOUNTER — Encounter: Payer: Self-pay | Admitting: Nurse Practitioner

## 2018-10-05 ENCOUNTER — Ambulatory Visit: Payer: Medicaid Other | Attending: Nurse Practitioner | Admitting: Nurse Practitioner

## 2018-10-05 VITALS — BP 141/84 | HR 86 | Temp 98.7°F | Ht 74.5 in | Wt 203.0 lb

## 2018-10-05 DIAGNOSIS — E1142 Type 2 diabetes mellitus with diabetic polyneuropathy: Secondary | ICD-10-CM | POA: Diagnosis not present

## 2018-10-05 DIAGNOSIS — Z7982 Long term (current) use of aspirin: Secondary | ICD-10-CM | POA: Insufficient documentation

## 2018-10-05 DIAGNOSIS — I110 Hypertensive heart disease with heart failure: Secondary | ICD-10-CM | POA: Insufficient documentation

## 2018-10-05 DIAGNOSIS — I5043 Acute on chronic combined systolic (congestive) and diastolic (congestive) heart failure: Secondary | ICD-10-CM | POA: Insufficient documentation

## 2018-10-05 DIAGNOSIS — F17210 Nicotine dependence, cigarettes, uncomplicated: Secondary | ICD-10-CM

## 2018-10-05 DIAGNOSIS — Z8249 Family history of ischemic heart disease and other diseases of the circulatory system: Secondary | ICD-10-CM | POA: Insufficient documentation

## 2018-10-05 DIAGNOSIS — B1920 Unspecified viral hepatitis C without hepatic coma: Secondary | ICD-10-CM | POA: Diagnosis not present

## 2018-10-05 DIAGNOSIS — Z7902 Long term (current) use of antithrombotics/antiplatelets: Secondary | ICD-10-CM | POA: Insufficient documentation

## 2018-10-05 DIAGNOSIS — Z7984 Long term (current) use of oral hypoglycemic drugs: Secondary | ICD-10-CM | POA: Diagnosis not present

## 2018-10-05 DIAGNOSIS — Z79899 Other long term (current) drug therapy: Secondary | ICD-10-CM | POA: Diagnosis not present

## 2018-10-05 DIAGNOSIS — I10 Essential (primary) hypertension: Secondary | ICD-10-CM

## 2018-10-05 DIAGNOSIS — E1165 Type 2 diabetes mellitus with hyperglycemia: Secondary | ICD-10-CM | POA: Insufficient documentation

## 2018-10-05 DIAGNOSIS — E785 Hyperlipidemia, unspecified: Secondary | ICD-10-CM | POA: Diagnosis not present

## 2018-10-05 DIAGNOSIS — Z1211 Encounter for screening for malignant neoplasm of colon: Secondary | ICD-10-CM | POA: Diagnosis not present

## 2018-10-05 DIAGNOSIS — Z8673 Personal history of transient ischemic attack (TIA), and cerebral infarction without residual deficits: Secondary | ICD-10-CM | POA: Diagnosis not present

## 2018-10-05 DIAGNOSIS — I5033 Acute on chronic diastolic (congestive) heart failure: Secondary | ICD-10-CM | POA: Diagnosis not present

## 2018-10-05 DIAGNOSIS — F17200 Nicotine dependence, unspecified, uncomplicated: Secondary | ICD-10-CM

## 2018-10-05 LAB — GLUCOSE, POCT (MANUAL RESULT ENTRY): POC Glucose: 116 mg/dl — AB (ref 70–99)

## 2018-10-05 MED ORDER — ACCU-CHEK FASTCLIX LANCETS MISC: 3 refills | Status: DC

## 2018-10-05 MED ORDER — GLUCOSE BLOOD VI STRP: ORAL_STRIP | 12 refills | Status: DC

## 2018-10-05 MED ORDER — AMLODIPINE BESYLATE 10 MG PO TABS: 10.0000 mg | ORAL_TABLET | Freq: Every day | ORAL | 2 refills | Status: DC

## 2018-10-05 MED ORDER — ACCU-CHEK FASTCLIX LANCET KIT: 1.0000 | PACK | Freq: Once | 0 refills | Status: AC

## 2018-10-05 MED ORDER — ACCU-CHEK GUIDE CONTROL VI LIQD: 1.0000 | Freq: Once | 0 refills | Status: DC | PRN

## 2018-10-05 MED ORDER — TRUEPLUS LANCETS 28G MISC: 3 refills | Status: DC

## 2018-10-05 MED ORDER — ACCU-CHEK GUIDE ME W/DEVICE KIT: 1.0000 | PACK | Freq: Once | 0 refills | Status: AC

## 2018-10-05 MED ORDER — TRUE METRIX METER W/DEVICE KIT: PACK | 0 refills | Status: DC

## 2018-10-05 MED FILL — AMLODIPINE BESYLATE 10 MG T: 10 | 30 days supply | Qty: 30 | Fill #0

## 2018-10-05 MED FILL — ACCU-CHEK FASTCLIX LANCETS: 30 days supply | Qty: 102 | Fill #0

## 2018-10-05 MED FILL — ACCU-CHEK GUIDE TEST STRIP: 30 days supply | Qty: 100 | Fill #0

## 2018-10-05 MED FILL — ACCU-CHEK GUIDE W/DEVICE KI: W/DEVICE | 1 days supply | Qty: 1 | Fill #0

## 2018-10-05 NOTE — Progress Notes (Signed)
Assessment & Plan:  Ronald Marsh was seen today for establish care.  Diagnoses and all orders for this visit:  Controlled type 2 diabetes mellitus with diabetic polyneuropathy, without long-term current use of insulin (HCC) -     Glucose (CBG) -     Ambulatory referral to Ophthalmology -     Ambulatory referral to Podiatry -     Blood Glucose Calibration (ACCU-CHEK GUIDE CONTROL) LIQD; 1 each by In Vitro route once as needed for up to 1 dose. E11.9 -     Blood Glucose Monitoring Suppl (ACCU-CHEK GUIDE ME) w/Device KIT; 1 each by Does not apply route once for 1 dose. E11.9 -     glucose blood (ACCU-CHEK GUIDE) test strip; Use as instructed. Check blood glucose by fingerstick once per day.  E11.9 -     Lancets Misc. (ACCU-CHEK FASTCLIX LANCET) KIT; 1 kit by Does not apply route once for 1 dose. -     Accu-Chek FastClix Lancets MISC; Use as instructed. Inject into the skin once daily. E11.9 Controlled Continue medications as prescribed.  Continue blood sugar control as discussed in office today, low carbohydrate diet, and regular physical exercise as tolerated, 150 minutes per week (30 min each day, 5 days per week, or 50 min 3 days per week). Keep blood sugar logs with fasting goal of 90-130 mg/dl, post prandial (after you eat) less than 180.  For Hypoglycemia: BS <60 and Hyperglycemia BS >400; contact the clinic ASAP. Annual eye exams and foot exams are recommended.    Essential hypertension -     amLODipine (NORVASC) 10 MG tablet; Take 1 tablet (10 mg total) by mouth daily. -     Blood Pressure Monitor DEVI; Please provide patient with insurance approved blood pressure monitor  Acute on chronic diastolic CHF (congestive heart failure)  ([place]) Follow up with cardiology in 3 weeks as scheduled Take all meds as prescribed.   Tobacco dependence Ronald Marsh was counseled on the dangers of tobacco use, and was advised to quit. Reviewed strategies to maximize success, including removing cigarettes  and smoking materials from environment, stress management and support of family/friends as well as pharmacological alternatives including: Wellbutrin, Chantix, Nicotine patch, Nicotine gum or lozenges. Smoking cessation support: smoking cessation hotline: 1-800-QUIT-NOW.  Smoking cessation classes are also available through [facility]. Call [phone] or visit our website at [email or web address]   A total of 4 minutes was spent on counseling for smoking cessation and Ronald Marsh is ready to quit but declines smoking cessation management today.   Colon cancer screening -     Ambulatory referral to Gastroenterology     Patient has been counseled on age-appropriate routine health concerns for screening and prevention. These are reviewed and up-to-date. Referrals have been placed accordingly. Immunizations are up-to-date or declined.    Subjective:   Chief Complaint  Patient presents with   Establish Care    Pt is establishing care.    HPI Ronald Marsh 58 y.o. male presents to office today to re establish care.  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  Admitted to [facility] 12/19 with recurrent HF and HTN. Saw Dr. Joya Gaskins as outpatient and echo done 07/03/18 EF25-30%    DM TYPE 2 Taking metformin 500 mg BID. A1c well controlled. He does  not have a monitor or glucose meter. Continues to smoke.  States he his trying to cut back on smoking and now down to 2-3 cigarettes per day. Down from half pack .Started smoking at 51 Denies any hypo or hyperglycemic symptoms. Intolerance to ACE/ARB. Taking statin.  Lab Results  Component Value Date   HGBA1C 6.4 (A) 06/12/2018    Mixed Hyperlipidemia LDL close to goal <70. Taking atorvastatin 43m daily. Denies any  statin intolerance or myalgias.  Lab Results  Component Value Date   LDLCALC 72 06/12/2018    Essential Hypertension Last seen by Cardiology/HF clinic on 07-25-2018 for newly diagnosed systolic and diastolic CM. At that time Bidil 1/2 tab TID was added due to poorly controlled HTN. He was to follow up with the CHF pharmacist 6-8 weeks afterward (HE WAS A NO SHOW). He was also instructed to follow up with Cardiology in 3 months. He has an appointment scheduled for 10-25-2018. Additional  medications include: amlodipine 10 mg, coreg 3.125 mg BID, and spironolactone 25 mg daily.  He currently denies chest pain, increased shortness of breath or BLE edema. He is not monitoring his blood pressure. Will order BP machine pending insurance approval.  BP Readings from Last 3 Encounters:  10/05/18 (!) 141/84  08/01/18 116/74  07/25/18 (!) 144/108    Review of Systems  Constitutional: Negative for fever, malaise/fatigue and weight loss.  HENT: Negative.  Negative for nosebleeds.   Eyes: Negative.  Negative for blurred vision, double vision and photophobia.  Respiratory: Negative.  Negative for cough and shortness of breath.   Cardiovascular: Negative.  Negative for chest pain, palpitations and leg swelling.  Gastrointestinal: Negative.  Negative for heartburn, nausea and vomiting.  Musculoskeletal: Negative.  Negative for myalgias.  Neurological: Negative.  Negative for dizziness, focal weakness, seizures and headaches.  Psychiatric/Behavioral: Negative.  Negative for suicidal ideas.    Past Medical History:  Diagnosis Date   [place]    Hepatitis C    History of CVA (cerebrovascular accident) 02/2010   Left periventricular subcortical ischemic infarction // Residual RUE and RLE weakness   History of depression    surrounding original stroke - has since resolved (04/2012)   Hyperlipidemia    Hypertension    Internal carotid artery stenosis    BL and  mild per CT angiogram (05/2010)   [place]   Tobacco abuse     History reviewed. No pertinent surgical history.  Family History  Problem Relation Age of Onset   Diabetes Mother    Heart failure Mother    Hypertension Mother    Hyperlipidemia Mother    Glaucoma Mother    Lung cancer Father    Hypertension Brother    Diabetes Brother    Pancreatic cancer Brother    Diabetes Sister    Arthritis Sister     Social History Reviewed with no changes to be made today.   Outpatient Medications Prior to Visit  Medication Sig Dispense Refill   aspirin 81 MG tablet Take 1 tablet (81 mg total) by mouth daily. 30 tablet 5   atorvastatin (LIPITOR) 80 MG tablet Take 1 tablet (80 mg total) by mouth daily. 30 tablet 4   carvedilol (COREG) 3.125 MG tablet Take 1 tablet (3.125 mg total) by mouth 2 (two) times daily with a meal. 60 tablet 4   clopidogrel (PLAVIX) 75 MG tablet Take 1 tablet (75 mg total) by mouth daily. 30 tablet 3   isosorbide-hydrALAZINE (BIDIL) 20-37.5  MG tablet Take 0.5 tablets by mouth 3 (three) times daily. 45 tablet 6   metFORMIN (GLUCOPHAGE) 500 MG tablet Take 1 tablet (500 mg total) by mouth 2 (two) times daily with a meal. 60 tablet 4   naproxen (NAPROSYN) 375 MG tablet Take 1 tablet (375 mg total) by mouth 2 (two) times daily as needed for moderate pain. 60 tablet 2   spironolactone (ALDACTONE) 25 MG tablet Take 1 tablet (25 mg total) by mouth daily. 30 tablet 4   amLODipine (NORVASC) 10 MG tablet Take 1 tablet (10 mg total) by mouth daily. 30 tablet 2   No facility-administered medications prior to visit.     Allergies  Allergen Reactions   Benicar [Olmesartan] Anaphylaxis and Swelling    Patient cannot name the site of swelling (??)   Ace Inhibitors Swelling    Patient cannot name the site of swelling (??)   Angiotensin Receptor Blockers Swelling    Patient cannot name the site of swelling (??)       Objective:    BP (!)  141/84 (BP Location: Left Arm, Patient Position: Sitting, Cuff Size: Normal)    Pulse 86    Temp 98.7 F (37.1 C) (Oral)    Ht 6' 2.5" (1.892 m)    Wt 203 lb (92.1 kg)    SpO2 99%    BMI 25.71 kg/m  Wt Readings from Last 3 Encounters:  10/05/18 203 lb (92.1 kg)  08/01/18 201 lb (91.2 kg)  07/25/18 201 lb 6.4 oz (91.4 kg)    Physical Exam Vitals signs and nursing note reviewed.  Constitutional:      Appearance: He is well-developed.  HENT:     Head: Normocephalic and atraumatic.  Neck:     Musculoskeletal: Normal range of motion.  Cardiovascular:     Rate and Rhythm: Normal rate and regular rhythm.     Heart sounds: Normal heart sounds. No murmur. No friction rub. No gallop.   Pulmonary:     Effort: Pulmonary effort is normal. No tachypnea or respiratory distress.     Breath sounds: Normal breath sounds. No decreased breath sounds, wheezing, rhonchi or rales.  Chest:     Chest wall: No tenderness.  Abdominal:     General: Bowel sounds are normal.  Musculoskeletal: Normal range of motion.  Skin:    General: Skin is warm and dry.  Neurological:     Mental Status: He is alert and oriented to person, place, and time.     Coordination: Coordination normal.  Psychiatric:        Behavior: Behavior normal. Behavior is cooperative.        Thought Content: Thought content normal.        Judgment: Judgment normal.          Patient has been counseled extensively about nutrition and exercise as well as the importance of adherence with medications and regular follow-up. The patient was given clear instructions to go to ER or return to medical center if symptoms don't improve, worsen or new problems develop. The patient verbalized understanding.   Follow-up: Return in about 2 months (around 12/05/2018).   [name], FNP-BC [facility] [place], [place]   10/14/2018, 12:43 AM

## 2018-10-14 ENCOUNTER — Encounter: Payer: Self-pay | Admitting: Nurse Practitioner

## 2018-10-14 MED ORDER — BLOOD PRESSURE MONITOR DEVI: 0 refills | Status: DC

## 2018-10-22 ENCOUNTER — Other Ambulatory Visit: Payer: Self-pay | Admitting: Critical Care Medicine

## 2018-10-22 ENCOUNTER — Ambulatory Visit (INDEPENDENT_AMBULATORY_CARE_PROVIDER_SITE_OTHER): Payer: Medicaid Other | Admitting: Family

## 2018-10-22 ENCOUNTER — Encounter: Payer: Self-pay | Admitting: Family

## 2018-10-22 ENCOUNTER — Telehealth: Payer: Self-pay | Admitting: Pharmacy Technician

## 2018-10-22 ENCOUNTER — Other Ambulatory Visit: Payer: Self-pay

## 2018-10-22 VITALS — BP 156/100 | HR 70 | Temp 97.5°F | Wt 200.0 lb

## 2018-10-22 DIAGNOSIS — B182 Chronic viral hepatitis C: Secondary | ICD-10-CM | POA: Diagnosis not present

## 2018-10-22 DIAGNOSIS — F1911 Other psychoactive substance abuse, in remission: Secondary | ICD-10-CM

## 2018-10-22 MED FILL — metFORMIN HCL 500 MG TABS: 500 | 30 days supply | Qty: 60 | Fill #3

## 2018-10-22 MED FILL — BIDIL TABLET: 20-37.5 | 30 days supply | Qty: 45 | Fill #2

## 2018-10-22 NOTE — Telephone Encounter (Signed)
[facility] Patient Advocate Encounter  Met with patient this afternoon and had him fill out and sign the readiness form. He receives his mail at the [facility] center at [address] in [place]. [facility] and inquired if we could courier the medications to our location. He is currently without a phone but will call us when he gets new phone, provided him with our phone number.

## 2018-10-22 NOTE — Patient Instructions (Signed)
Nice to meet you.  We will check your blood work today.  Please call the office in 2 weeks to get your results.   Limit acetaminophen (Tylenol) usage to no more than 2 grams (2,000 mg) per day.  Avoid alcohol.  Do not share toothbrushes or razors.  Practice safe sex to protect against transmission as well as sexually transmitted disease.    Hepatitis C Hepatitis C is a viral infection of the liver. It can lead to scarring of the liver (cirrhosis), liver failure, or liver cancer. Hepatitis C may go undetected for months or years because people with the infection may not have symptoms, or they may have only mild symptoms. What are the causes? This condition is caused by the hepatitis C virus (HCV). The virus can spread from person to person (is contagious) through:  Blood.  Childbirth. A woman who has hepatitis C can pass it to her baby during birth.  Bodily fluids, such as breast milk, tears, semen, vaginal fluids, and saliva.  Blood transfusions or organ transplants done in the [place] before 1992.  What increases the risk? The following factors may make you more likely to develop this condition:  Having contact with unclean (contaminated) needles or syringes. This may result from: ? Acupuncture. ? Tattoing. ? Body piercing. ? Injecting drugs.  Having unprotected sex with someone who is infected.  Needing treatment to filter your blood (kidney dialysis).  Having HIV (human immunodeficiency virus) or AIDS (acquired immunodeficiency syndrome).  Working in a job that involves contact with blood or bodily fluids, such as health care.  What are the signs or symptoms? Symptoms of this condition include:  Fatigue.  Loss of appetite.  Nausea.  Vomiting.  Abdominal pain.  Dark yellow urine.  Yellowish skin and eyes (jaundice).  Itchy skin.  Clay-colored bowel movements.  Joint pain.  Bleeding and bruising easily.  Fluid building up in your stomach  (ascites).  In some cases, you may not have any symptoms. How is this diagnosed? This condition is diagnosed with:  Blood tests.  Other tests to check how well your liver is functioning. They may include: ? Magnetic resonance elastography (MRE). This imaging test uses MRIs and sound waves to measure liver stiffness. ? Transient elastography. This imaging test uses ultrasounds to measure liver stiffness. ? Liver biopsy. This test requires taking a small tissue sample from your liver to examine it under a microscope.  How is this treated? Your health care provider may perform noninvasive tests or a liver biopsy to help decide the best course of treatment. Treatment may include:  Antiviral medicines and other medicines.  Follow-up treatments every 6-12 months for infections or other liver conditions.  Receiving a donated liver (liver transplant).  Follow these instructions at home: Medicines  Take over-the-counter and prescription medicines only as told by your health care provider.  Take your antiviral medicine as told by your health care provider. Do not stop taking the antiviral even if you start to feel better.  Do not take any medicines unless approved by your health care provider, including over-the-counter medicines and birth control pills. Activity  Rest as needed.  Do not have sex unless approved by your health care provider.  Ask your health care provider when you may return to school or work. Eating and drinking  Eat a balanced diet with plenty of fruits and vegetables, whole grains, and lowfat (lean) meats or non-meat proteins (such as beans or tofu).  Drink enough fluids to keep  your urine clear or pale yellow.  Do not drink alcohol. General instructions  Do not share toothbrushes, nail clippers, or razors.  Wash your hands frequently with soap and water. If soap and water are not available, use hand sanitizer.  Cover any cuts or open sores on your skin to  prevent spreading the virus.  Keep all follow-up visits as told by your health care provider. This is important. You may need follow-up visits every 6-12 months. How is this prevented? There is no vaccine for hepatitis C. The only way to prevent the disease is to reduce the risk of exposure to the virus. Make sure you:  Wash your hands frequently with soap and water. If soap and water are not available, use hand sanitizer.  Do not share needles or syringes.  Practice safe sex and use condoms.  Avoid handling blood or bodily fluids without gloves or other protection.  Avoid getting tattoos or piercings in shops or other locations that are not clean.  Contact a health care provider if:  You have a fever.  You develop abdominal pain.  You pass dark urine.  You pass clay-colored stools.  You develop joint pain. Get help right away if:  You have increasing fatigue or weakness.  You lose your appetite.  You cannot eat or drink without vomiting.  You develop jaundice or your jaundice gets worse.  You bruise or bleed easily. Summary  Hepatitis C is a viral infection of the liver. It can lead to scarring of the liver (cirrhosis), liver failure, or liver cancer.  The hepatitis C virus (HCV) causes this condition. The virus can pass from person to person (is contagious).  You should not take any medicines unless approved by your health care provider. This includes over-the-counter medicines and birth control pills. This information is not intended to replace advice given to you by your health care provider. Make sure you discuss any questions you have with your health care provider. Document Released: 04/22/2000 Document Revised: 05/31/2016 Document Reviewed: 05/31/2016 Elsevier Interactive Patient Education  [employer].

## 2018-10-22 NOTE — Assessment & Plan Note (Signed)
Ronald Marsh has been sober for 3 months now. Encouraged to find counseling for support, but declines at present.

## 2018-10-22 NOTE — Assessment & Plan Note (Signed)
Ronald Marsh has Chronic Hepatitis C with viral load of 2.8 million likely acquired from previous history of substance abuse which he has now been sober for 3 months. We discussed the pathogenesis, transmission, prevention, risks if left untreated, and treatment options for Hepatitis C. Will check blood work today. He completed financial paperwork with our pharmacy staff. He does not have a phone and will contact our office for results. Plan for tentative treatment with [facility] or [facility].

## 2018-10-22 NOTE — Progress Notes (Signed)
Subjective:    Patient ID: Ronald Marsh, male    DOB: 05-19-1960, 58 y.o.   [name]: [id number]  Chief Complaint  Patient presents with  . Hepatitis C    HPI:  Ronald Marsh is a 58 y.o. male with medical history of Hepatitis C, cardiomyopathy, history of substance abuse, Type 2 diabetes, hypertension, and CVA presents today for evaluation and treatment of Hepatitis C.  Ronald Marsh was initially diagnosed with Hepatitis C within the year 2019 and does not recall exactly when.  Risk factors for acquiring Hepatitis C include prior history of substance abuse. Denies blood transfusions prior to 1992, tattoos, sharing of razors or toothbrushes, or a sexual partner that was positive. No personal or family history of liver disease. Currently asymptomatic. He is a former smoker of crack and has been sober for at least 3 months now. He does continue to use tobacco and he drinks alcohol on occasion.   Ronald Marsh most recent blood work from 06/12/18 shows AST 53, ALT 61, and Hepatitis C RNA level of between 924,000-2.8 million.      Allergies  Allergen Reactions  . Benicar [Olmesartan] Anaphylaxis and Swelling    Patient cannot name the site of swelling (??)  . Ace Inhibitors Swelling    Patient cannot name the site of swelling (??)  . Angiotensin Receptor Blockers Swelling    Patient cannot name the site of swelling (??)      Outpatient Medications Prior to Visit  Medication Sig Dispense Refill  . Accu-Chek FastClix Lancets MISC Use as instructed. Inject into the skin once daily. E11.9 100 each 3  . amLODipine (NORVASC) 10 MG tablet Take 1 tablet (10 mg total) by mouth daily. 90 tablet 2  . aspirin 81 MG tablet Take 1 tablet (81 mg total) by mouth daily. 30 tablet 5  . atorvastatin (LIPITOR) 80 MG tablet Take 1 tablet (80 mg total) by mouth daily. 30 tablet 4  . Blood Glucose Calibration (ACCU-CHEK GUIDE CONTROL) LIQD 1 each by In Vitro route once as needed for up to 1 dose. E11.9 1 each  0  . Blood Pressure Monitor DEVI Please provide patient with insurance approved blood pressure monitor 1 Device 0  . carvedilol (COREG) 3.125 MG tablet Take 1 tablet (3.125 mg total) by mouth 2 (two) times daily with a meal. 60 tablet 4  . clopidogrel (PLAVIX) 75 MG tablet Take 1 tablet (75 mg total) by mouth daily. 30 tablet 3  . glucose blood (ACCU-CHEK GUIDE) test strip Use as instructed. Check blood glucose by fingerstick once per day.  E11.9 100 each 12  . isosorbide-hydrALAZINE (BIDIL) 20-37.5 MG tablet Take 0.5 tablets by mouth 3 (three) times daily. 45 tablet 6  . metFORMIN (GLUCOPHAGE) 500 MG tablet Take 1 tablet (500 mg total) by mouth 2 (two) times daily with a meal. 60 tablet 4  . naproxen (NAPROSYN) 375 MG tablet Take 1 tablet (375 mg total) by mouth 2 (two) times daily as needed for moderate pain. 60 tablet 2  . spironolactone (ALDACTONE) 25 MG tablet Take 1 tablet (25 mg total) by mouth daily. 30 tablet 4   No facility-administered medications prior to visit.      Past Medical History:  Diagnosis Date  . Diabetes mellitus without complication ([place])   . Heart failure ([place])   . Hepatitis C   . History of CVA (cerebrovascular accident) 02/2010   Left periventricular subcortical ischemic infarction // Residual RUE and RLE weakness  . History  of depression    surrounding original stroke - has since resolved (04/2012)  . Hyperlipidemia   . Hypertension   . Internal carotid artery stenosis    BL and mild per CT angiogram (05/2010)  . Stroke ([place]) 2012  . Tobacco abuse       History reviewed. No pertinent surgical history.    Family History  Problem Relation Age of Onset  . Diabetes Mother   . Heart failure Mother   . Hypertension Mother   . Hyperlipidemia Mother   . Glaucoma Mother   . Lung cancer Father   . Hypertension Brother   . Diabetes Brother   . Pancreatic cancer Brother   . Diabetes Sister   . Arthritis Sister       Social History    Socioeconomic History  . Marital status: Married    Spouse name: Not on file  . Number of children: 1  . Years of education: 11th grade  . Highest education level: Not on file  Occupational History  . Occupation: On disability    Comment: previously worked for [employer] until his stroke in 2011.   Social Needs  . Financial resource strain: Not on file  . Food insecurity    Worry: Not on file    Inability: Not on file  . Transportation needs    Medical: Not on file    Non-medical: Not on file  Tobacco Use  . Smoking status: Current Some Day Smoker    Packs/day: 0.30    Years: 31.00    Pack years: 9.30    Types: Cigarettes    Start date: 04/25/1981  . Smokeless tobacco: Never Used  . Tobacco comment: up to 3 cigs/day; cutting back.  Substance and Sexual Activity  . Alcohol use: Not Currently    Comment: occasional  . Drug use: No    Comment: Former smoke crack   . Sexual activity: Yes  Lifestyle  . Physical activity    Days per week: Not on file    Minutes per session: Not on file  . Stress: Not on file  Relationships  . Social Herbalist on phone: Not on file    Gets together: Not on file    Attends religious service: Not on file    Active member of club or organization: Not on file    Attends meetings of clubs or organizations: Not on file    Relationship status: Not on file  . Intimate partner violence    Fear of current or ex partner: Not on file    Emotionally abused: Not on file    Physically abused: Not on file    Forced sexual activity: Not on file  Other Topics Concern  . Not on file  Social History Narrative   Lives in [place] with wife.   Has been incarcerated multiple times, last time was in 2012 for 8 months.      Review of Systems  Constitutional: Negative for chills, diaphoresis, fatigue and fever.  Respiratory: Negative for cough, chest tightness, shortness of breath and wheezing.   Cardiovascular: Negative for chest pain.   Gastrointestinal: Negative for abdominal distention, abdominal pain, constipation, diarrhea, nausea and vomiting.  Neurological: Negative for weakness and headaches.  Hematological: Does not bruise/bleed easily.       Objective:    BP (!) 156/100   Pulse 70   Temp (!) 97.5 F (36.4 C)   Wt 200 lb (90.7 kg)   BMI  25.33 kg/m  Nursing note and vital signs reviewed.  Physical Exam Constitutional:      General: He is not in acute distress.    Appearance: He is well-developed.  Cardiovascular:     Rate and Rhythm: Normal rate and regular rhythm.     Heart sounds: Normal heart sounds. No murmur. No friction rub. No gallop.   Pulmonary:     Effort: Pulmonary effort is normal. No respiratory distress.     Breath sounds: Normal breath sounds. No wheezing or rales.  Chest:     Chest wall: No tenderness.  Abdominal:     General: Bowel sounds are normal. There is no distension.     Palpations: Abdomen is soft. There is no mass.     Tenderness: There is no abdominal tenderness. There is no guarding or rebound.  Musculoskeletal:     Comments: Right sided upper extremity and lower extremity weakness.   Skin:    General: Skin is warm and dry.  Neurological:     Mental Status: He is alert and oriented to person, place, and time.     Motor: Weakness present.  Psychiatric:        Mood and Affect: Mood normal.         Assessment & Plan:   Problem List Items Addressed This Visit      Digestive   Chronic hepatitis C without hepatic coma ([place]) - Primary    Mr. Suleiman has Chronic Hepatitis C with viral load of 2.8 million likely acquired from previous history of substance abuse which he has now been sober for 3 months. We discussed the pathogenesis, transmission, prevention, risks if left untreated, and treatment options for Hepatitis C. Will check blood work today. He completed financial paperwork with our pharmacy staff. He does not have a phone and will contact our office for  results. Plan for tentative treatment with Mavyret or Epclusa.       Relevant Orders   Hepatitis C genotype   Liver Fibrosis, FibroTest-ActiTest   CBC   COMPLETE METABOLIC PANEL WITH GFR   Hepatitis B surface antibody,qualitative   Hepatitis B surface antigen     Other   History of substance abuse [facility])    Mr. Ardis has been sober for 3 months now. Encouraged to find counseling for support, but declines at present.           I am having Kenn File maintain his spironolactone, metFORMIN, clopidogrel, carvedilol, atorvastatin, aspirin, naproxen, isosorbide-hydrALAZINE, amLODipine, Accu-Chek Guide Control, glucose blood, Accu-Chek FastClix Lancets, and Blood Pressure Monitor.   Follow-up: Return in about 1 month (around 11/21/2018), or if symptoms worsen or fail to improve.    Terri Piedra, MSN, FNP-C Nurse Practitioner [facility] Office phone: [phone] Pager: [place] number: [phone]

## 2018-10-25 ENCOUNTER — Encounter (HOSPITAL_COMMUNITY): Payer: Medicaid Other | Admitting: Internal Medicine

## 2018-10-26 NOTE — Telephone Encounter (Signed)
Patient called back on 06/18 and provided [place] his new cell phone number [phone]). Have updated number in the system. His lab results should return in a week and we will precede with the prior authorization for [place] Medicaid at that time. He has been approved to have his medications sent via courier to the clinic and informed the patient of that yesterday. There is a chance he will be moving to a home in [place] while trying to find housing, he will update [place] with that information so that the pharmacy can be made aware of the change and  medication mailed, until then, we will courier medication.

## 2018-10-29 MED FILL — AMLODIPINE BESYLATE 10 MG T: 10 | 30 days supply | Qty: 30 | Fill #1

## 2018-10-31 LAB — CBC
HCT: 40.9 % (ref 38.5–50.0)
Hemoglobin: 14.4 g/dL (ref 13.2–17.1)
MCH: 32.4 pg (ref 27.0–33.0)
MCHC: 35.2 g/dL (ref 32.0–36.0)
MCV: 91.9 fL (ref 80.0–100.0)
MPV: 11.5 fL (ref 7.5–12.5)
Platelets: 250 10*3/uL (ref 140–400)
RBC: 4.45 10*6/uL (ref 4.20–5.80)
RDW: 13.1 % (ref 11.0–15.0)
WBC: 5.9 10*3/uL (ref 3.8–10.8)

## 2018-10-31 LAB — LIVER FIBROSIS, FIBROTEST-ACTITEST
ALT: 25 U/L (ref 9–46)
Alpha-2-Macroglobulin: 157 mg/dL (ref 106–279)
Apolipoprotein A1: 117 mg/dL (ref 94–176)
Bilirubin: 0.6 mg/dL (ref 0.2–1.2)
Fibrosis Score: 0.24
GGT: 43 U/L (ref 3–85)
Haptoglobin: 190 mg/dL (ref 43–212)
Necroinflammat ACT Score: 0.1
Reference ID: 2974387

## 2018-10-31 LAB — COMPLETE METABOLIC PANEL WITH GFR
AG Ratio: 1.5 (calc) (ref 1.0–2.5)
ALT: 25 U/L (ref 9–46)
AST: 24 U/L (ref 10–35)
Albumin: 4.8 g/dL (ref 3.6–5.1)
Alkaline phosphatase (APISO): 58 U/L (ref 35–144)
BUN: 15 mg/dL (ref 7–25)
CO2: 27 mmol/L (ref 20–32)
Calcium: 10.2 mg/dL (ref 8.6–10.3)
Chloride: 103 mmol/L (ref 98–110)
Creat: 0.99 mg/dL (ref 0.70–1.33)
GFR, Est African American: 99 mL/min/{1.73_m2} (ref >=60)
GFR, Est Non African American: 85 mL/min/{1.73_m2} (ref >=60)
Globulin: 3.3 g/dL (calc) (ref 1.9–3.7)
Glucose, Bld: 108 mg/dL — ABNORMAL HIGH (ref 65–99)
Potassium: 4 mmol/L (ref 3.5–5.3)
Sodium: 140 mmol/L (ref 135–146)
Total Bilirubin: 0.7 mg/dL (ref 0.2–1.2)
Total Protein: 8.1 g/dL (ref 6.1–8.1)

## 2018-10-31 LAB — HEPATITIS C GENOTYPE

## 2018-10-31 LAB — HEPATITIS B SURFACE ANTIBODY,QUALITATIVE: Hep B S Ab: BORDERLINE — AB

## 2018-10-31 LAB — HEPATITIS B SURFACE ANTIGEN: Hepatitis B Surface Ag: NONREACTIVE

## 2018-11-01 ENCOUNTER — Other Ambulatory Visit: Payer: Self-pay

## 2018-11-01 MED ORDER — SPIRONOLACTONE 25 MG PO TABS: 25.0000 mg | ORAL_TABLET | Freq: Every day | ORAL | 2 refills | Status: DC

## 2018-11-01 MED FILL — CARVEDILOL 3.125 MG TABLET: 3.125 | 30 days supply | Qty: 60 | Fill #4

## 2018-11-01 NOTE — Telephone Encounter (Signed)
Patient is requesting medication refill for his Carvedilol and Spironolactone to [facility] pharmacy.    Medication was sent. CMA called patient and inform his medication was sent.  Pt. Understood.

## 2018-11-03 IMAGING — CT CT RENAL STONE PROTOCOL
2 of 4 series · 16 of 46 positions shown, 18 images · non-contrast
Comparison: None.

CLINICAL DATA: Hematuria beginning this morning.

EXAM:
CT ABDOMEN AND PELVIS WITHOUT CONTRAST
TECHNIQUE: Multidetector CT imaging of the abdomen and pelvis was performed
following the standard protocol without IV contrast.

[Series 3: stone study 5.0 i30f 2 · axial · 0.84mm/px · z∈[+735,+1195]mm · 13 of 101 slices shown, 15 images]
[im 5/101  soft-tissue]
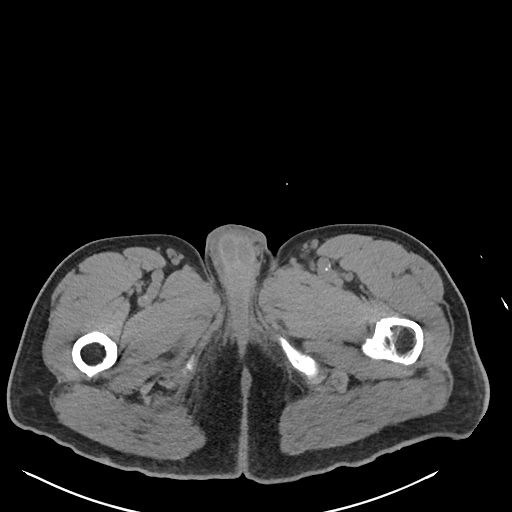
[im 5/101  bone]
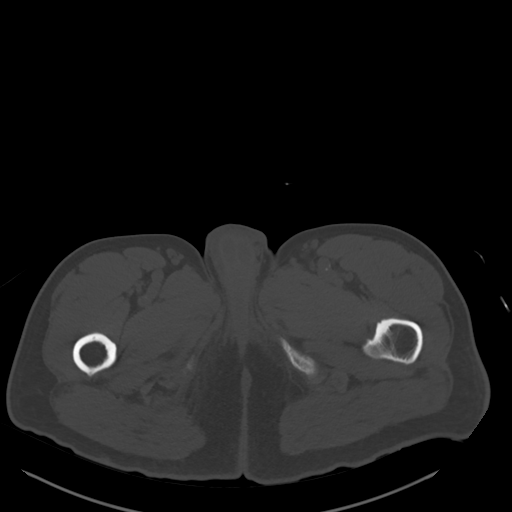
[im 13/101  soft-tissue]
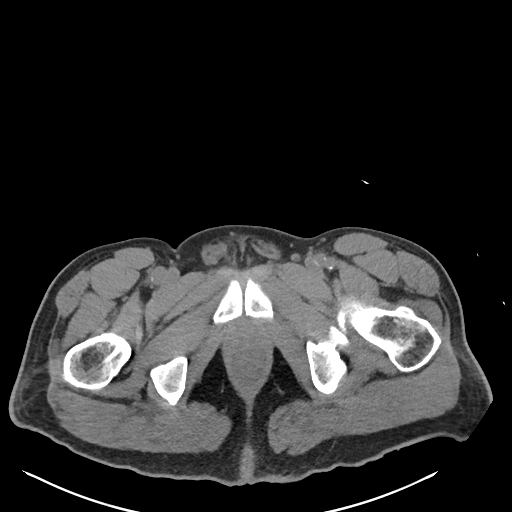
[im 21/101  soft-tissue]
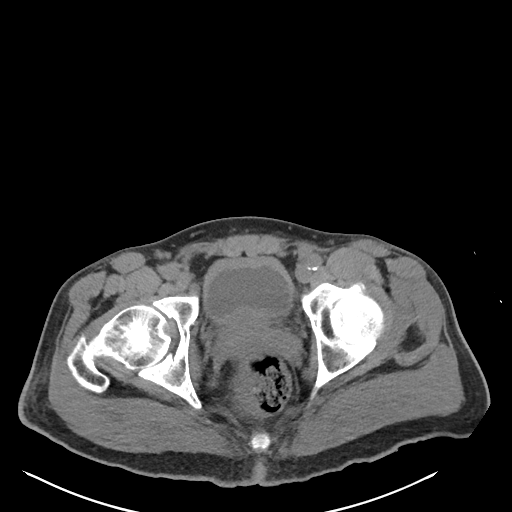
[im 29/101  soft-tissue]
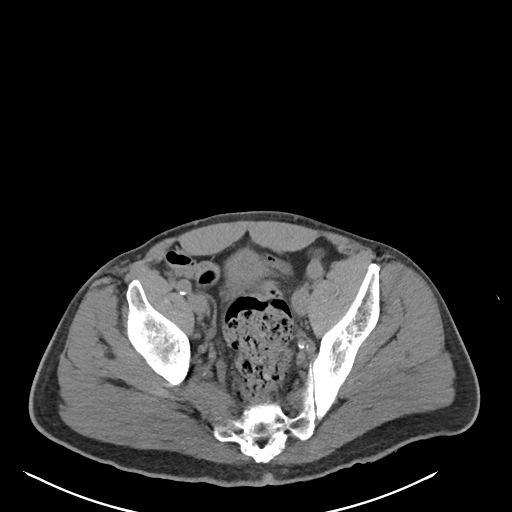
[im 37/101  soft-tissue]
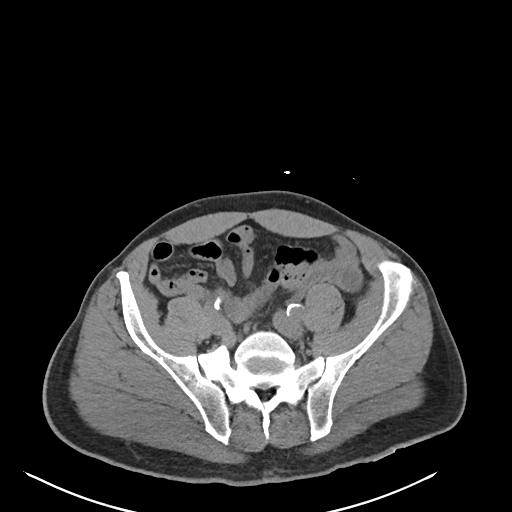
[im 45/101  soft-tissue]
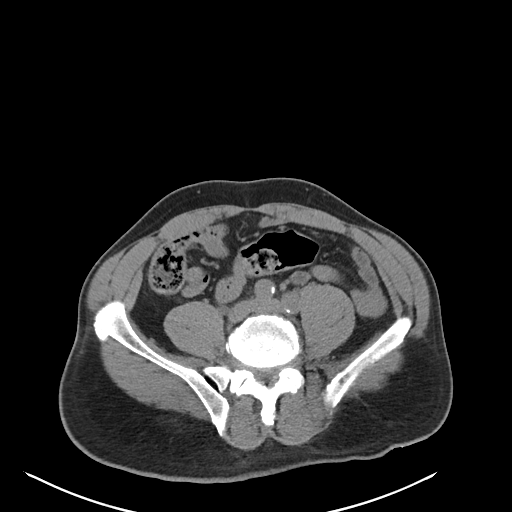
[im 53/101  soft-tissue]
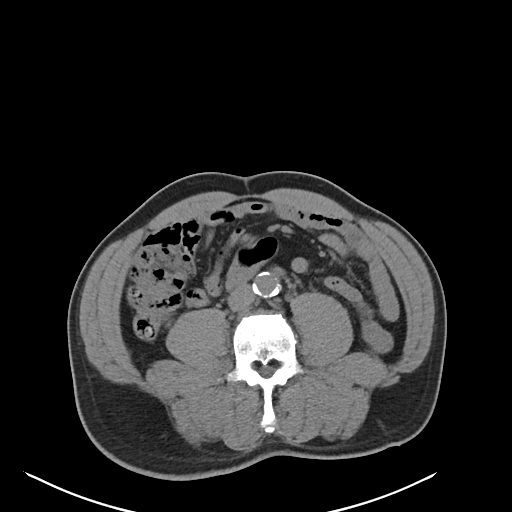
[im 57/101  soft-tissue]
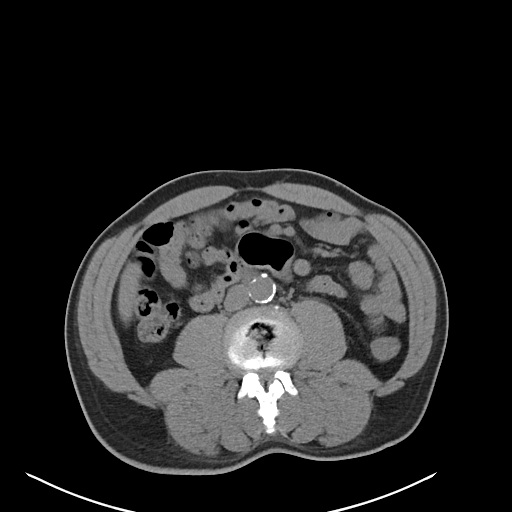
[im 65/101  soft-tissue]
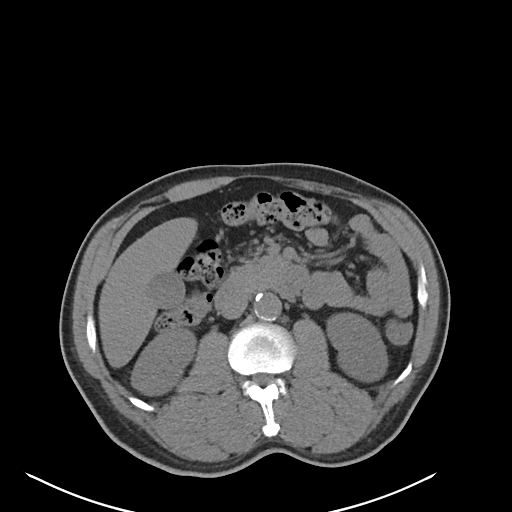
[im 65/101  bone]
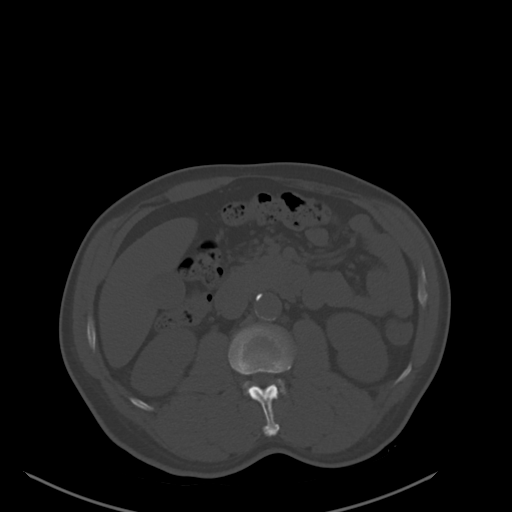
[im 73/101  soft-tissue]
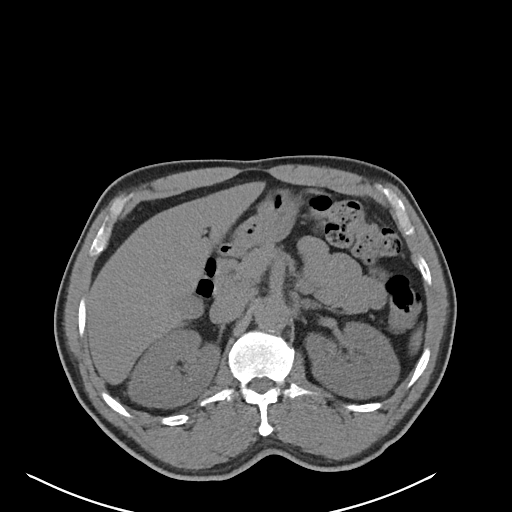
[im 81/101  soft-tissue]
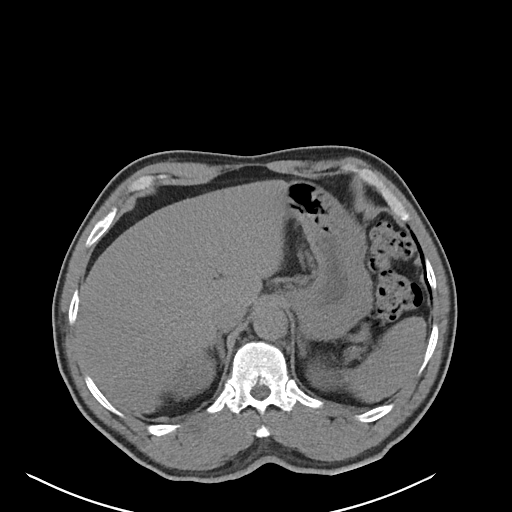
[im 89/101  soft-tissue]
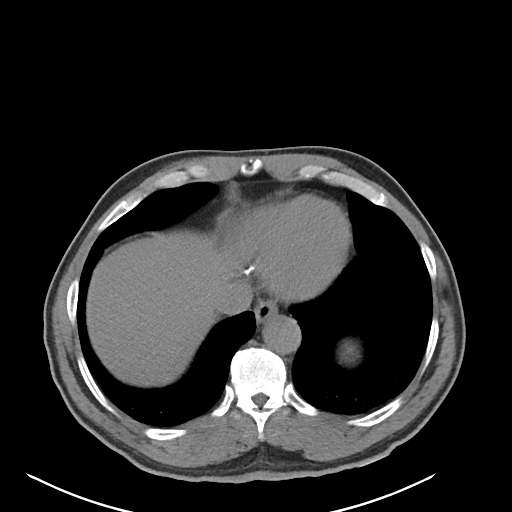
[im 97/101  soft-tissue]
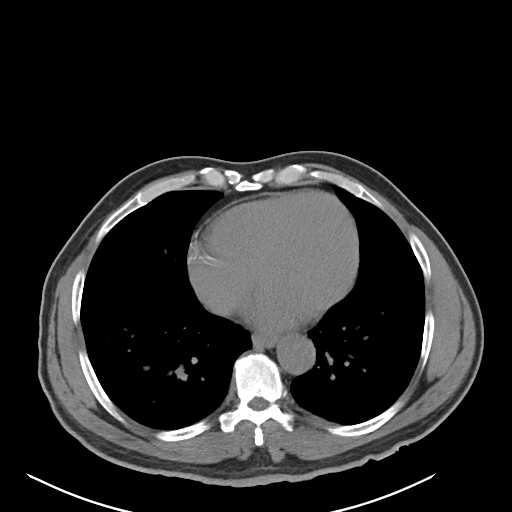

[Series 6: coronal soft tissue · coronal · 0.78mm/px · 3 of 110 slices shown]
[im 37/110  soft-tissue]
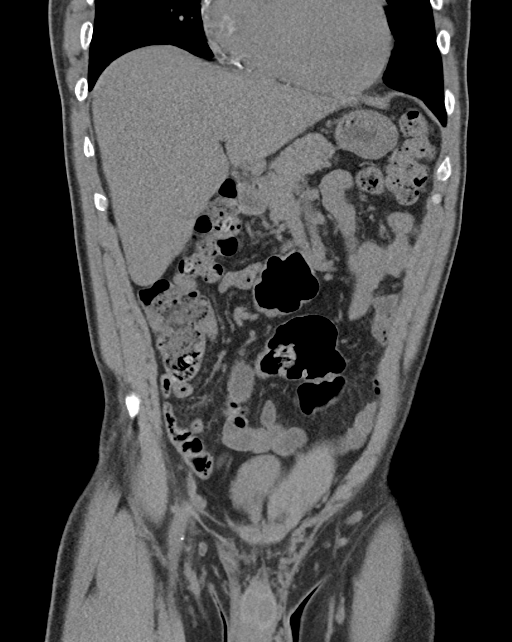
[im 49/110  soft-tissue]
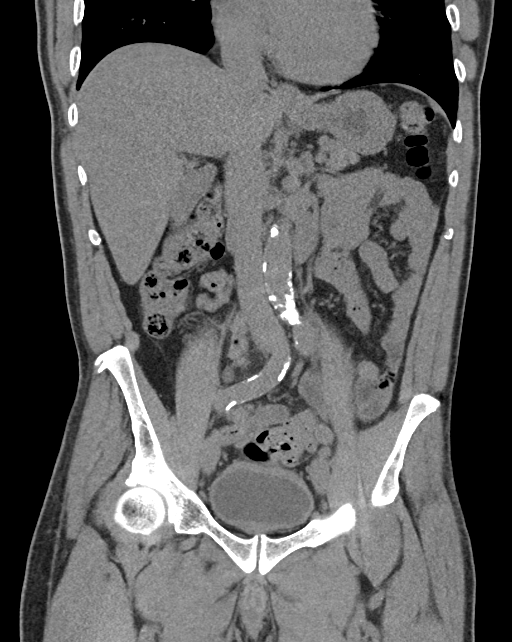
[im 61/110  soft-tissue]
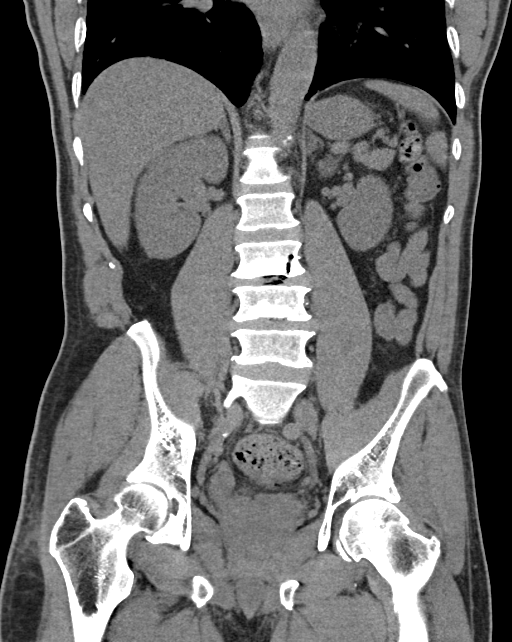

[16 of 46 positions shown; findings below may reference images not displayed]

FINDINGS: Lower chest: No acute abnormality. Calcified plaque over the left
lateral circumflex and right coronary arteries.

Hepatobiliary: Normal.

Pancreas: Normal.

Spleen: Normal.

Adrenals/Urinary Tract: Adrenal glands are normal. Kidneys are
normal size without hydronephrosis or nephrolithiasis. 1 cm cyst
over the mid pole right kidney. Ureters are normal. Bladder is
somewhat decompressed with mild diffuse wall thickening.

Stomach/Bowel: Stomach and small bowel are normal. Appendix is
normal. Colon is within normal with mild fecal retention.

Vascular/Lymphatic: Mild calcified plaque over the abdominal aorta
and iliac arteries. No adenopathy.

Reproductive: Normal.

Other: No free fluid or focal inflammatory change.

Musculoskeletal: Degenerate change of the spine and hips.
IMPRESSION: No evidence of nephroureterolithiasis or obstruction. No acute
findings in the abdomen/pelvis.

Mild bladder wall thickening which non-specific, but may be due to
cystitis.

1 cm right renal cyst.

Aortic Atherosclerosis ([id number]).

Atherosclerotic coronary artery disease.

## 2018-11-05 ENCOUNTER — Other Ambulatory Visit: Payer: Self-pay

## 2018-11-05 MED ORDER — CLOPIDOGREL BISULFATE 75 MG PO TABS: 75.0000 mg | ORAL_TABLET | Freq: Every day | ORAL | 6 refills | Status: DC

## 2018-11-05 MED FILL — CLOPIDOGREL 75 MG TABLET: 75 | 90 days supply | Qty: 90 | Fill #0

## 2018-11-05 NOTE — Telephone Encounter (Signed)
Dr Joya Gaskins authorized this refill via a paper request, I have sent it electronically due to 340b reimbursement requirements.

## 2018-11-12 ENCOUNTER — Other Ambulatory Visit: Payer: Self-pay | Admitting: Family

## 2018-11-12 DIAGNOSIS — B182 Chronic viral hepatitis C: Secondary | ICD-10-CM

## 2018-11-12 MED ORDER — SOFOSBUVIR-VELPATASVIR 400-100 MG PO TABS: 1.0000 | ORAL_TABLET | Freq: Every day | ORAL | 2 refills | Status: DC

## 2018-11-12 NOTE — Progress Notes (Signed)
Ronald Marsh has Genotype 1a chronic Hepatitis C with viral load of 2.8 million. Fibrosis score of F0-F1. Will plan for 12 weeks of Epclusa. Prescription sent to pharmacy.

## 2018-11-13 ENCOUNTER — Telehealth: Payer: Self-pay | Admitting: Pharmacist

## 2018-11-13 ENCOUNTER — Telehealth: Payer: Self-pay | Admitting: Pharmacy Technician

## 2018-11-13 MED FILL — SOFOSBUVIR-VELPATASVIR 400-: 400-100 | 28 days supply | Qty: 28 | Fill #0

## 2018-11-13 NOTE — Telephone Encounter (Signed)
Patient is approved to receive Epclusa x 12 weeks for chronic Hepatitis C infection. Counseled patient to take Epclusa daily with or without food. Encouraged patient not to miss any doses and explained how their chance of cure could go down with each dose missed. Counseled patient on what to do if dose is missed - if it is closer to the missed dose take immediately; if closer to next dose skip dose and take the next dose at the usual time.    Counseled patient on common side effects such as headache, fatigue, and nausea and that these normally decrease with time. I reviewed patient medications and found a drug interaction with his Lipitor.  Patient has a history of stroke and needs the high intensity statin so I counseled him to watch out for any myalgias or muscle pain and to let us know if he develops this.  I will also check a CMET at his 4 week follow-up to check his liver function. Discussed with patient that there are several drug interactions including acid suppressants. Instructed patient to call clinic if he wishes to start a new medication during course of therapy. Also advised patient to call if he experiences any side effects. Patient will follow-up with me in the pharmacy clinic on 8/12.

## 2018-11-13 NOTE — Telephone Encounter (Signed)
[facility] Patient Advocate Encounter  Prior Authorization for Ronald Marsh has been approved.    Effective dates: 11/13/2018 through 01/12/2019  Patients co-pay is $3.00.   I have spoken to the patient and [name] will have his medication sent via courier to arrive 07/08, patient will pick up 07/09 before clinic closes.

## 2018-11-13 NOTE — Telephone Encounter (Addendum)
[facility] Patient Advocate Encounter   Received notification from [employer] that prior authorization for Ronald Marsh is required.   PA submitted on 11/13/2018 Key [id number] W Status is pending, will check on [place] [facility] regularly    [facility] will continue to follow.  [name], [place] Patient [facility] Phone: [phone] Fax: [phone] 11/13/2018 11:21 AM

## 2018-11-16 NOTE — Telephone Encounter (Signed)
Medication arrived from [place] on 07/08, called the patient to let him know the medication arrived but had to leave a voicemail. As of Friday 07/10 medication had not been picked up.

## 2018-11-19 ENCOUNTER — Telehealth: Payer: Self-pay | Admitting: Pharmacy Technician

## 2018-11-19 ENCOUNTER — Encounter: Payer: Self-pay | Admitting: Pharmacy Technician

## 2018-11-19 NOTE — Telephone Encounter (Signed)
Patient picked up medication 11/19/2018 @ 10:30am and will begin taking medication today.

## 2018-11-19 NOTE — Telephone Encounter (Signed)
[facility] Patient Advocate Encounter  [facility] approved the patient's medication for 8 weeks, will need to resubmit prior to the completion of week 7 to start prior authorization for the last 4 weeks.  Prior Approval: [id number] Recipient ID: [id number] Cydney Ok on 01/13/2019 for new 4 weeks approval Drug Name: Marissa Nestle for submission in file cabinet

## 2018-11-19 NOTE — Telephone Encounter (Signed)
Awesome! Thanks

## 2018-11-21 ENCOUNTER — Other Ambulatory Visit: Payer: Self-pay | Admitting: Critical Care Medicine

## 2018-11-21 MED FILL — metFORMIN HCL 500 MG TABS: 500 | 30 days supply | Qty: 60 | Fill #4

## 2018-11-21 MED FILL — SPIRONOLACTONE 25 MG TABLET: 25 | 30 days supply | Qty: 30 | Fill #0

## 2018-11-21 MED FILL — ATORVASTATIN 80 MG TABLET: 80 | 90 days supply | Qty: 90 | Fill #0

## 2018-11-30 MED FILL — BIDIL TABLET: 20-37.5 | 30 days supply | Qty: 45 | Fill #3

## 2018-11-30 MED FILL — AMLODIPINE BESYLATE 10 MG T: 10 | 30 days supply | Qty: 30 | Fill #2

## 2018-12-05 ENCOUNTER — Ambulatory Visit: Payer: Medicaid Other | Admitting: Nurse Practitioner

## 2018-12-07 ENCOUNTER — Encounter: Payer: Self-pay | Admitting: Nurse Practitioner

## 2018-12-07 ENCOUNTER — Other Ambulatory Visit: Payer: Self-pay

## 2018-12-07 ENCOUNTER — Ambulatory Visit: Payer: Medicaid Other | Attending: Nurse Practitioner | Admitting: Nurse Practitioner

## 2018-12-07 VITALS — BP 115/69 | HR 80 | Temp 98.5°F | Ht 74.5 in | Wt 199.2 lb

## 2018-12-07 DIAGNOSIS — Z7984 Long term (current) use of oral hypoglycemic drugs: Secondary | ICD-10-CM | POA: Diagnosis not present

## 2018-12-07 DIAGNOSIS — Z8673 Personal history of transient ischemic attack (TIA), and cerebral infarction without residual deficits: Secondary | ICD-10-CM | POA: Insufficient documentation

## 2018-12-07 DIAGNOSIS — E1142 Type 2 diabetes mellitus with diabetic polyneuropathy: Secondary | ICD-10-CM | POA: Diagnosis not present

## 2018-12-07 DIAGNOSIS — I509 Heart failure, unspecified: Secondary | ICD-10-CM | POA: Insufficient documentation

## 2018-12-07 DIAGNOSIS — Z7982 Long term (current) use of aspirin: Secondary | ICD-10-CM | POA: Diagnosis not present

## 2018-12-07 DIAGNOSIS — Z8249 Family history of ischemic heart disease and other diseases of the circulatory system: Secondary | ICD-10-CM | POA: Insufficient documentation

## 2018-12-07 DIAGNOSIS — Z79899 Other long term (current) drug therapy: Secondary | ICD-10-CM | POA: Diagnosis not present

## 2018-12-07 DIAGNOSIS — E785 Hyperlipidemia, unspecified: Secondary | ICD-10-CM | POA: Insufficient documentation

## 2018-12-07 DIAGNOSIS — Z7902 Long term (current) use of antithrombotics/antiplatelets: Secondary | ICD-10-CM | POA: Diagnosis not present

## 2018-12-07 DIAGNOSIS — B1920 Unspecified viral hepatitis C without hepatic coma: Secondary | ICD-10-CM | POA: Insufficient documentation

## 2018-12-07 DIAGNOSIS — I10 Essential (primary) hypertension: Secondary | ICD-10-CM

## 2018-12-07 DIAGNOSIS — Z720 Tobacco use: Secondary | ICD-10-CM | POA: Diagnosis not present

## 2018-12-07 DIAGNOSIS — I110 Hypertensive heart disease with heart failure: Secondary | ICD-10-CM | POA: Diagnosis not present

## 2018-12-07 LAB — POCT GLYCOSYLATED HEMOGLOBIN (HGB A1C): Hemoglobin A1C: 6 % — AB (ref 4.0–5.6)

## 2018-12-07 LAB — GLUCOSE, POCT (MANUAL RESULT ENTRY): POC Glucose: 138 mg/dl — AB (ref 70–99)

## 2018-12-07 MED ORDER — METFORMIN HCL 500 MG PO TABS: 500.0000 mg | ORAL_TABLET | Freq: Two times a day (BID) | ORAL | 4 refills | Status: DC

## 2018-12-07 NOTE — Progress Notes (Signed)
Assessment & Plan:  Ronald Marsh was seen today for follow-up.  Diagnoses and all orders for this visit:  Controlled type 2 diabetes mellitus with diabetic polyneuropathy, without long-term current use of insulin (HCC) -     Glucose (CBG) -     HgB A1c -     metFORMIN (GLUCOPHAGE) 500 MG tablet; Take 1 tablet (500 mg total) by mouth 2 (two) times daily with a meal. -     Ambulatory referral to Ophthalmology Controlled Continue medications as prescribed.  Continue blood sugar control as discussed in office today, low carbohydrate diet, and regular physical exercise as tolerated, 150 minutes per week (30 min each day, 5 days per week, or 50 min 3 days per week). Keep blood sugar logs with fasting goal of 90-130 mg/dl, post prandial (after you eat) less than 180.  For Hypoglycemia: BS <60 and Hyperglycemia BS >400; contact the clinic ASAP. Annual eye exams and foot exams are recommended.  Essential hypertension Continue all antihypertensives as prescribed.  Remember to bring in your blood pressure log with you for your follow up appointment.  DASH/Mediterranean Diets are healthier choices for HTN.    Dyslipidemia Continue Lipitor 80 mg as prescribed. INSTRUCTIONS: Work on a low fat, heart healthy diet and participate in regular aerobic exercise program by working out at least 150 minutes per week; 5 days a week-30 minutes per day. Avoid red meat, fried foods. junk foods, sodas, sugary drinks, unhealthy snacking, alcohol and smoking.  Drink at least 48oz of water per day and monitor your carbohydrate intake daily.     Patient has been counseled on age-appropriate routine health concerns for screening and prevention. These are reviewed and up-to-date. Referrals have been placed accordingly. Immunizations are up-to-date or declined.    Subjective:   Chief Complaint  Patient presents with  . Follow-up    Pt. is here to follow up on diabetes and HTN.    HPI Ronald Marsh 58 y.o. male  presents to office today for follow up.   has a past medical history of Diabetes mellitus without complication ([place]), Heart failure (I have instructed him to contact Dr. Clayborne Dana office for follow-up.  He is overdue for an office visit.), Hepatitis C, History of CVA (cerebrovascular accident) (02/2010), History of depression, Hyperlipidemia, Hypertension, Internal carotid artery stenosis, Stroke ([facility]) (2012), and Tobacco abuse.    DM TYPE 2 Well controlled. Taking metformin 500 mg BID. He does not monitor his blood glucose levels at home.  Denies any hypo-or hyperglycemic symptoms. He is overdue for eye exam.  Lab Results  Component Value Date   HGBA1C 6.0 (A) 12/07/2018   Lab Results  Component Value Date   HGBA1C 6.4 (A) 06/12/2018    Essential Hypertension Taking amlodipine 10 mg daily, carvedilol 3.125 mg BID (CHF), Bidil 20-37.5 mg (CHF), spironolactone 25 mg daily. He does not monitor his blood pressure at home. Denies chest pain, shortness of breath, palpitations, lightheadedness, dizziness, headaches or BLE edema.  Overall doing and feeling well today. BP Readings from Last 3 Encounters:  12/07/18 115/69  10/22/18 (!) 156/100  10/05/18 (!) 141/84    Dyslipidemia Taking atorvastatin 80 mg daily. High intensity statin (CVA). Denies any statin intolerance.   Lab Results  Component Value Date   LDLCALC 72 06/12/2018    Review of Systems  Constitutional: Negative for fever, malaise/fatigue and weight loss.  HENT: Negative.  Negative for nosebleeds.   Eyes: Negative.  Negative for blurred vision, double vision and photophobia.  Respiratory: Negative.  Negative for cough and shortness of breath.   Cardiovascular: Negative.  Negative for chest pain, palpitations and leg swelling.  Gastrointestinal: Negative.  Negative for heartburn, nausea and vomiting.  Musculoskeletal: Negative.  Negative for myalgias.  Neurological: Negative.  Negative for dizziness, focal  weakness, seizures and headaches.  Psychiatric/Behavioral: Negative.  Negative for suicidal ideas.    Past Medical History:  Diagnosis Date  . Diabetes mellitus without complication ([place])   . Heart failure ([place])   . Hepatitis C   . History of CVA (cerebrovascular accident) 02/2010   Left periventricular subcortical ischemic infarction // Residual RUE and RLE weakness  . History of depression    surrounding original stroke - has since resolved (04/2012)  . Hyperlipidemia   . Hypertension   . Internal carotid artery stenosis    BL and mild per CT angiogram (05/2010)  . Stroke ([place]) 2012  . Tobacco abuse     History reviewed. No pertinent surgical history.  Family History  Problem Relation Age of Onset  . Diabetes Mother   . Heart failure Mother   . Hypertension Mother   . Hyperlipidemia Mother   . Glaucoma Mother   . Lung cancer Father   . Hypertension Brother   . Diabetes Brother   . Pancreatic cancer Brother   . Diabetes Sister   . Arthritis Sister     Social History Reviewed with no changes to be made today.   Outpatient Medications Prior to Visit  Medication Sig Dispense Refill  . Accu-Chek FastClix Lancets MISC Use as instructed. Inject into the skin once daily. E11.9 100 each 3  . amLODipine (NORVASC) 10 MG tablet Take 1 tablet (10 mg total) by mouth daily. 90 tablet 2  . aspirin 81 MG tablet Take 1 tablet (81 mg total) by mouth daily. 30 tablet 5  . atorvastatin (LIPITOR) 80 MG tablet TAKE 1 TABLET (80 MG TOTAL) BY MOUTH DAILY. 30 tablet 2  . Blood Glucose Calibration (ACCU-CHEK GUIDE CONTROL) LIQD 1 each by In Vitro route once as needed for up to 1 dose. E11.9 1 each 0  . Blood Pressure Monitor DEVI Please provide patient with insurance approved blood pressure monitor 1 Device 0  . carvedilol (COREG) 3.125 MG tablet Take 1 tablet (3.125 mg total) by mouth 2 (two) times daily with a meal. 60 tablet 4  . clopidogrel (PLAVIX) 75 MG tablet Take 1 tablet (75 mg  total) by mouth daily. 30 tablet 6  . glucose blood (ACCU-CHEK GUIDE) test strip Use as instructed. Check blood glucose by fingerstick once per day.  E11.9 100 each 12  . isosorbide-hydrALAZINE (BIDIL) 20-37.5 MG tablet Take 0.5 tablets by mouth 3 (three) times daily. 45 tablet 6  . naproxen (NAPROSYN) 375 MG tablet Take 1 tablet (375 mg total) by mouth 2 (two) times daily as needed for moderate pain. 60 tablet 2  . Sofosbuvir-Velpatasvir (EPCLUSA) 400-100 MG TABS Take 1 tablet by mouth daily. Take 1 tablet by mouth daily. 28 tablet 2  . spironolactone (ALDACTONE) 25 MG tablet Take 1 tablet (25 mg total) by mouth daily. 30 tablet 2  . metFORMIN (GLUCOPHAGE) 500 MG tablet Take 1 tablet (500 mg total) by mouth 2 (two) times daily with a meal. 60 tablet 4   No facility-administered medications prior to visit.     Allergies  Allergen Reactions  . Benicar [Olmesartan] Anaphylaxis and Swelling    Patient cannot name the site of swelling (??)  . Ace Inhibitors  Swelling    Patient cannot name the site of swelling (??)  . Angiotensin Receptor Blockers Swelling    Patient cannot name the site of swelling (??)       Objective:    BP 115/69 (BP Location: Right Arm, Patient Position: Sitting, Cuff Size: Normal)   Pulse 80   Temp 98.5 F (36.9 C) (Oral)   Ht 6' 2.5" (1.892 m)   Wt 199 lb 3.2 oz (90.4 kg)   SpO2 97%   BMI 25.23 kg/m  Wt Readings from Last 3 Encounters:  12/07/18 199 lb 3.2 oz (90.4 kg)  10/22/18 200 lb (90.7 kg)  10/05/18 203 lb (92.1 kg)    Physical Exam Vitals signs and nursing note reviewed.  Constitutional:      Appearance: He is well-developed.  HENT:     Head: Normocephalic and atraumatic.  Neck:     Musculoskeletal: Normal range of motion.  Cardiovascular:     Rate and Rhythm: Normal rate and regular rhythm.     Heart sounds: Normal heart sounds. No murmur. No friction rub. No gallop.   Pulmonary:     Effort: Pulmonary effort is normal. No tachypnea or  respiratory distress.     Breath sounds: Normal breath sounds. No decreased breath sounds, wheezing, rhonchi or rales.  Chest:     Chest wall: No tenderness.  Abdominal:     General: Bowel sounds are normal.     Palpations: Abdomen is soft.  Musculoskeletal: Normal range of motion.  Skin:    General: Skin is warm and dry.  Neurological:     Mental Status: He is alert and oriented to person, place, and time.     Coordination: Coordination normal.  Psychiatric:        Behavior: Behavior normal. Behavior is cooperative.        Thought Content: Thought content normal.        Judgment: Judgment normal.          Patient has been counseled extensively about nutrition and exercise as well as the importance of adherence with medications and regular follow-up. The patient was given clear instructions to go to ER or return to medical center if symptoms don't improve, worsen or new problems develop. The patient verbalized understanding.   Follow-up: Return in about 3 months (around 03/09/2019) for htn and 6 months diabetes.   [name], FNP-BC [facility] and [place] [facility], [place]   12/07/2018, 5:47 PM

## 2018-12-10 MED FILL — SOFOSBUVIR-VELPATASVIR 400-: 400-100 | 28 days supply | Qty: 28 | Fill #1

## 2018-12-10 NOTE — Telephone Encounter (Signed)
Spoke with patient today and scheduled his next refill of Epclusa to be sent via courier to arrive to the clinic by Wednesday of this week. Patient is aware and will pick up the medication either Wednesday or Thursday.

## 2018-12-11 ENCOUNTER — Other Ambulatory Visit: Payer: Self-pay

## 2018-12-11 ENCOUNTER — Ambulatory Visit: Payer: Medicaid Other | Admitting: Podiatry

## 2018-12-11 ENCOUNTER — Encounter: Payer: Self-pay | Admitting: Podiatry

## 2018-12-11 DIAGNOSIS — E1142 Type 2 diabetes mellitus with diabetic polyneuropathy: Secondary | ICD-10-CM | POA: Diagnosis not present

## 2018-12-11 DIAGNOSIS — M79675 Pain in left toe(s): Secondary | ICD-10-CM

## 2018-12-11 DIAGNOSIS — M21612 Bunion of left foot: Secondary | ICD-10-CM

## 2018-12-11 DIAGNOSIS — M21611 Bunion of right foot: Secondary | ICD-10-CM

## 2018-12-11 DIAGNOSIS — B351 Tinea unguium: Secondary | ICD-10-CM

## 2018-12-11 DIAGNOSIS — M79674 Pain in right toe(s): Secondary | ICD-10-CM

## 2018-12-11 NOTE — Progress Notes (Signed)
This patient presents to the office with chief complaint of long thick nails and diabetic feet.  This patient  says there  is  no pain and discomfort in his  feet.  This patient says there are long thick painful nails.  These nails are painful walking and wearing shoes.  Patient has no history of infection or drainage from both feet.  Patient is unable to  self treat his own nails .Patient has history of nail surgery previously. This patient presents  to the office today for treatment of the  long nails and a foot evaluation due to history of  diabetes.  General Appearance  Alert, conversant and in no acute stress.  Vascular  Dorsalis pedis and posterior tibial  pulses are palpable  bilaterally.  Capillary return is within normal limits  bilaterally. Temperature is within normal limits  bilaterally.  Neurologic  Senn-Weinstein monofilament wire test within normal limits  bilaterally. Muscle power within normal limits bilaterally.  Nails Thick disfigured discolored nails with subungual debris  from hallux to fifth toes bilaterally. No evidence of bacterial infection or drainage bilaterally.  Orthopedic  No limitations of motion of motion feet .  No crepitus or effusions noted.  No bony pathology or digital deformities noted.  HAV  B/L.  Skin  normotropic skin with no porokeratosis noted bilaterally.  No signs of infections or ulcers noted.     Onychomycosis  Diabetes with no foot complications  IE  Debride nails x 10.  A diabetic foot exam was performed and there is no evidence of any vascular or neurologic pathology.  Patient desires to talk with doctors concerning nail and bunion surgery.  RTC 3 months.   Gardiner Barefoot DPM

## 2018-12-12 NOTE — Telephone Encounter (Signed)
[facility] Patient Advocate Encounter  Patient's Ronald Marsh has been couriered to [facility] from [employer] and he picked it up today.  Venida Jarvis. Nadara Mustard [place] Patient [facility] for Infectious Disease Phone: [phone] Fax:  [phone]

## 2018-12-17 ENCOUNTER — Other Ambulatory Visit: Payer: Self-pay | Admitting: Critical Care Medicine

## 2018-12-17 MED FILL — CARVEDILOL 3.125 MG TABLET: 3.125 | 90 days supply | Qty: 180 | Fill #0

## 2018-12-17 MED FILL — SPIRONOLACTONE 25 MG TABLET: 25 | 30 days supply | Qty: 30 | Fill #1

## 2018-12-19 ENCOUNTER — Ambulatory Visit (INDEPENDENT_AMBULATORY_CARE_PROVIDER_SITE_OTHER): Payer: Medicaid Other | Admitting: Pharmacist

## 2018-12-19 ENCOUNTER — Other Ambulatory Visit: Payer: Self-pay

## 2018-12-19 DIAGNOSIS — B182 Chronic viral hepatitis C: Secondary | ICD-10-CM | POA: Diagnosis not present

## 2018-12-19 NOTE — Progress Notes (Signed)
HPI: Ronald Marsh is a 58 y.o. male who presents to the [place] clinic for Hepatitis C follow-up.  Medication: Epclusa x 12 weeks  Start Date: 11/19/2018  Hepatitis C Genotype: 1a  Fibrosis Score: F0/F1  Hepatitis C RNA: 2.8 million on 06/18/2018  Patient Active Problem List   Diagnosis Date Noted  . Pain due to onychomycosis of toenails of both feet 12/11/2018  . Chronic hepatitis C without hepatic coma ([place]) 06/13/2018  . Toenail fungus 06/12/2018  . Bilateral bunions 06/12/2018  . Acute on chronic diastolic CHF (congestive heart failure) ([place]) 05/06/2018  . Cardiomyopathy ([place]) 01/31/2018  . History of substance abuse ([place]) 02/19/2016  . Lumbar radiculopathy 10/01/2015  . Diabetes mellitus type 2, controlled ([place]) 07/03/2012  . Tobacco abuse   . Hypertension   . Hyperlipidemia   . History of CVA (cerebrovascular accident) 02/06/2010    Patient's Medications  New Prescriptions   No medications on file  Previous Medications   ACCU-CHEK FASTCLIX LANCETS MISC    Use as instructed. Inject into the skin once daily. E11.9   AMLODIPINE (NORVASC) 10 MG TABLET    Take 1 tablet (10 mg total) by mouth daily.   ASPIRIN 81 MG TABLET    Take 1 tablet (81 mg total) by mouth daily.   ATORVASTATIN (LIPITOR) 80 MG TABLET    TAKE 1 TABLET (80 MG TOTAL) BY MOUTH DAILY.   BLOOD GLUCOSE CALIBRATION (ACCU-CHEK GUIDE CONTROL) LIQD    1 each by In Vitro route once as needed for up to 1 dose. E11.9   BLOOD PRESSURE MONITOR DEVI    Please provide patient with insurance approved blood pressure monitor   CARVEDILOL (COREG) 3.125 MG TABLET    TAKE 1 TABLET (3.125 MG TOTAL) BY MOUTH 2 (TWO) TIMES DAILY WITH A MEAL.   CLOPIDOGREL (PLAVIX) 75 MG TABLET    Take 1 tablet (75 mg total) by mouth daily.   GLUCOSE BLOOD (ACCU-CHEK GUIDE) TEST STRIP    Use as instructed. Check blood glucose by fingerstick once per day.  E11.9   ISOSORBIDE-HYDRALAZINE (BIDIL) 20-37.5 MG TABLET    Take 0.5 tablets by  mouth 3 (three) times daily.   LISINOPRIL (ZESTRIL) 20 MG TABLET    Take by mouth.   METFORMIN (GLUCOPHAGE) 500 MG TABLET    Take 1 tablet (500 mg total) by mouth 2 (two) times daily with a meal.   NAPROXEN (NAPROSYN) 375 MG TABLET    Take 1 tablet (375 mg total) by mouth 2 (two) times daily as needed for moderate pain.   SOFOSBUVIR-VELPATASVIR (EPCLUSA) 400-100 MG TABS    Take 1 tablet by mouth daily. Take 1 tablet by mouth daily.   SPIRONOLACTONE (ALDACTONE) 25 MG TABLET    Take 1 tablet (25 mg total) by mouth daily.  Modified Medications   No medications on file  Discontinued Medications   No medications on file    Allergies: Allergies  Allergen Reactions  . Benicar [Olmesartan] Anaphylaxis and Swelling    Patient cannot name the site of swelling (??)  . Ace Inhibitors Swelling    Patient cannot name the site of swelling (??)  . Angiotensin Receptor Blockers Swelling    Patient cannot name the site of swelling (??)    Past Medical History: Past Medical History:  Diagnosis Date  . Diabetes mellitus without complication ([place])   . Heart failure ([place])   . Hepatitis C   . History of CVA (cerebrovascular accident) 02/2010   Left periventricular  subcortical ischemic infarction // Residual RUE and RLE weakness  . History of depression    surrounding original stroke - has since resolved (04/2012)  . Hyperlipidemia   . Hypertension   . Internal carotid artery stenosis    BL and mild per CT angiogram (05/2010)  . Stroke ([place]) 2012  . Tobacco abuse     Social History: Social History   Socioeconomic History  . Marital status: Married    Spouse name: Not on file  . Number of children: 1  . Years of education: 11th grade  . Highest education level: Not on file  Occupational History  . Occupation: On disability    Comment: previously worked for [employer] until his stroke in 2011.   Social Needs  . Financial resource strain: Not on file  . Food insecurity    Worry: Not  on file    Inability: Not on file  . Transportation needs    Medical: Not on file    Non-medical: Not on file  Tobacco Use  . Smoking status: Current Some Day Smoker    Packs/day: 0.30    Years: 31.00    Pack years: 9.30    Types: Cigarettes    Start date: 04/25/1981  . Smokeless tobacco: Never Used  . Tobacco comment: up to 3 cigs/day; cutting back.  Substance and Sexual Activity  . Alcohol use: Not Currently    Comment: occasional  . Drug use: No    Comment: Former smoke crack   . Sexual activity: Yes  Lifestyle  . Physical activity    Days per week: Not on file    Minutes per session: Not on file  . Stress: Not on file  Relationships  . Social Herbalist on phone: Not on file    Gets together: Not on file    Attends religious service: Not on file    Active member of club or organization: Not on file    Attends meetings of clubs or organizations: Not on file    Relationship status: Not on file  Other Topics Concern  . Not on file  Social History Narrative   Lives in [place] with wife.   Has been incarcerated multiple times, last time was in 2012 for 8 months.    Labs: Hepatitis C Lab Results  Component Value Date   HCVGENOTYPE 1a 10/22/2018   FIBROSTAGE F0-F1 10/22/2018   Hepatitis B Lab Results  Component Value Date   HEPBSAB BORDERLINE (A) 10/22/2018   HEPBSAG NON-REACTIVE 10/22/2018   Hepatitis A No results found for: HAV HIV Lab Results  Component Value Date   HIV Non Reactive 05/06/2018   HIV NON REACTIVE 03/02/2013   Lab Results  Component Value Date   CREATININE 0.99 10/22/2018   CREATININE 0.89 07/25/2018   CREATININE 1.01 06/12/2018   CREATININE 1.06 05/06/2018   CREATININE 0.89 05/05/2018   Lab Results  Component Value Date   AST 24 10/22/2018   AST 53 (H) 06/12/2018   AST 50 (H) 11/25/2017   ALT 25 10/22/2018   ALT 25 10/22/2018   ALT 61 (H) 06/12/2018   INR 0.99 06/01/2010   INR 1.04 02/26/2010    Assessment:  Ronald Marsh is here today for follow-up of his chronic Hepatitis C infection.  He started 12 weeks of Epclusa back on 7/13 and has already started his 2nd month. He is not experiencing any side effects and is tolerating it very well.  No missed doses. Congratulated him  on his perfect adherence and encouraged continued compliance.  Reminded him that he needs one more refill before he is done.  Adonis Brook will call him when it has arrived. Answered all questions. Will get labs today and see him back when he is done with treatment.   Plan: - Continue Epclusa x 12 weeks - Hep C viral load today - F/u with me again 10/21 at 1045am  Cassie L. Kuppelweiser, PharmD, BCIDP, AAHIVP, [place] for Infectious Disease 12/21/2018, 1:25 PM

## 2018-12-24 MED FILL — metFORMIN HCL 500 MG TABS: 500 | 30 days supply | Qty: 60 | Fill #0

## 2018-12-27 LAB — HEPATITIS C RNA QUANTITATIVE
HCV Quantitative Log: <1.18 Log IU/mL
HCV RNA, PCR, QN: <15 IU/mL

## 2018-12-28 ENCOUNTER — Telehealth: Payer: Self-pay | Admitting: Pharmacy Technician

## 2018-12-31 MED FILL — SOFOSBUVIR-VELPATASVIR 400-: 400-100 | 28 days supply | Qty: 28 | Fill #2

## 2018-12-31 NOTE — Telephone Encounter (Signed)
[facility] Patient Advocate Encounter  Prior Authorization for Ronald Marsh has been approved.    PA# [id number] W Effective dates: 12/31/2018 through 01/30/2019  Patients co-pay is $3.00.   [facility] will continue to follow.  [name], CPhT Specialty Pharmacy Patient [facility] for Infectious Disease Phone: [phone] Fax: [phone] 12/31/2018 9:56 AM

## 2018-12-31 NOTE — Telephone Encounter (Signed)
[facility] Patient Advocate Encounter   Received notification from [place] that prior authorization for Raeanne Gathers is required.   PA submitted on 12/31/2018 Key [id number] W Status is pending    [facility] will continue to follow.  [name], CPhT Specialty Pharmacy Patient [facility] for Infectious Disease Phone: [phone] Fax: [phone] 12/31/2018 8:36 AM

## 2019-01-01 MED FILL — AMLODIPINE BESYLATE 10 MG T: 10 | 30 days supply | Qty: 30 | Fill #3

## 2019-01-10 MED FILL — BIDIL TABLET: 20-37.5 | 30 days supply | Qty: 45 | Fill #4

## 2019-01-11 ENCOUNTER — Encounter (HOSPITAL_COMMUNITY): Payer: Medicaid Other

## 2019-01-12 IMAGING — CR DG CHEST 2V
2 series · 2 of 2 positions shown · non-contrast
Comparison: 05/02/2017

CLINICAL DATA: Chest tightness, shortness of breath

EXAM:
CHEST - 2 VIEW

[chest pa]
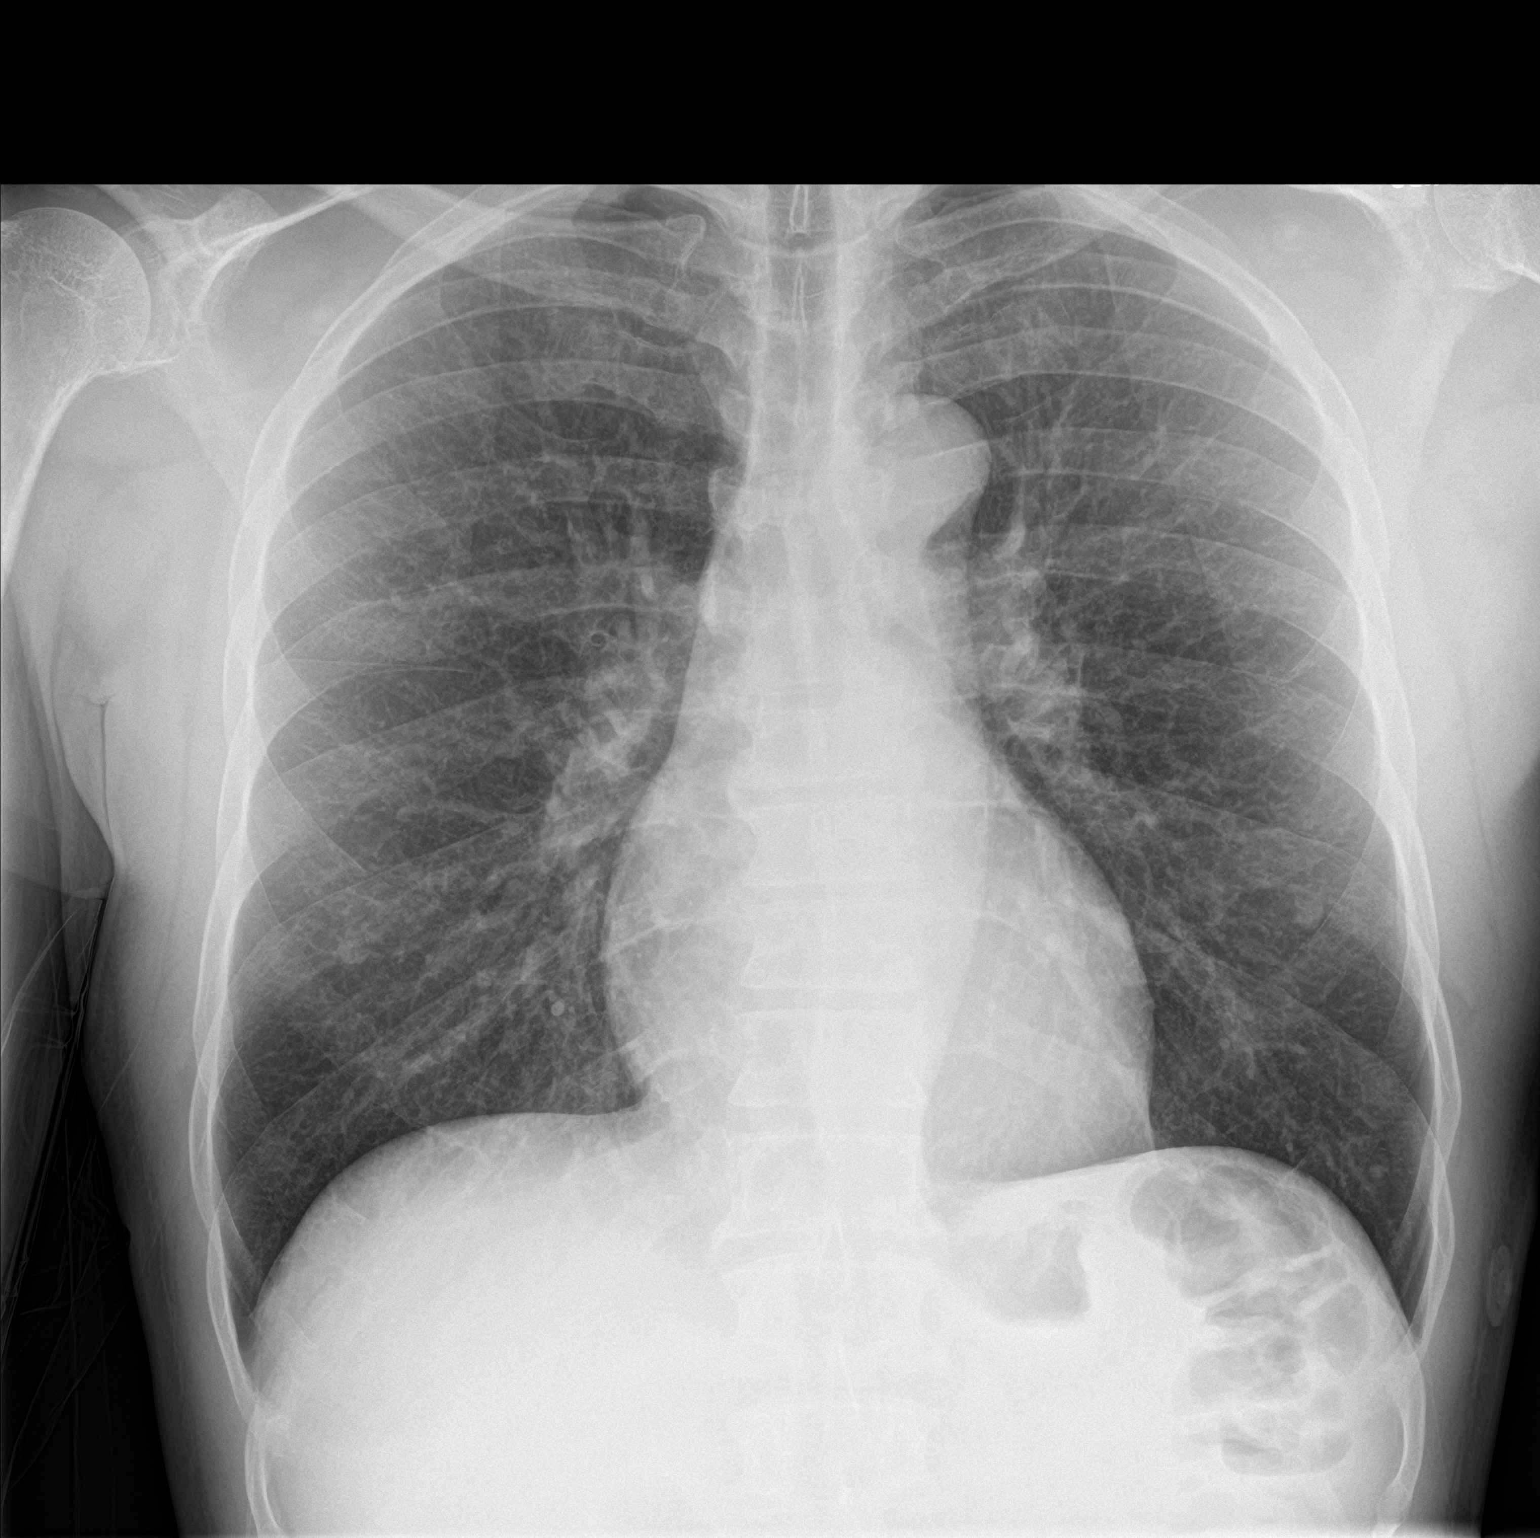

[chest lat]
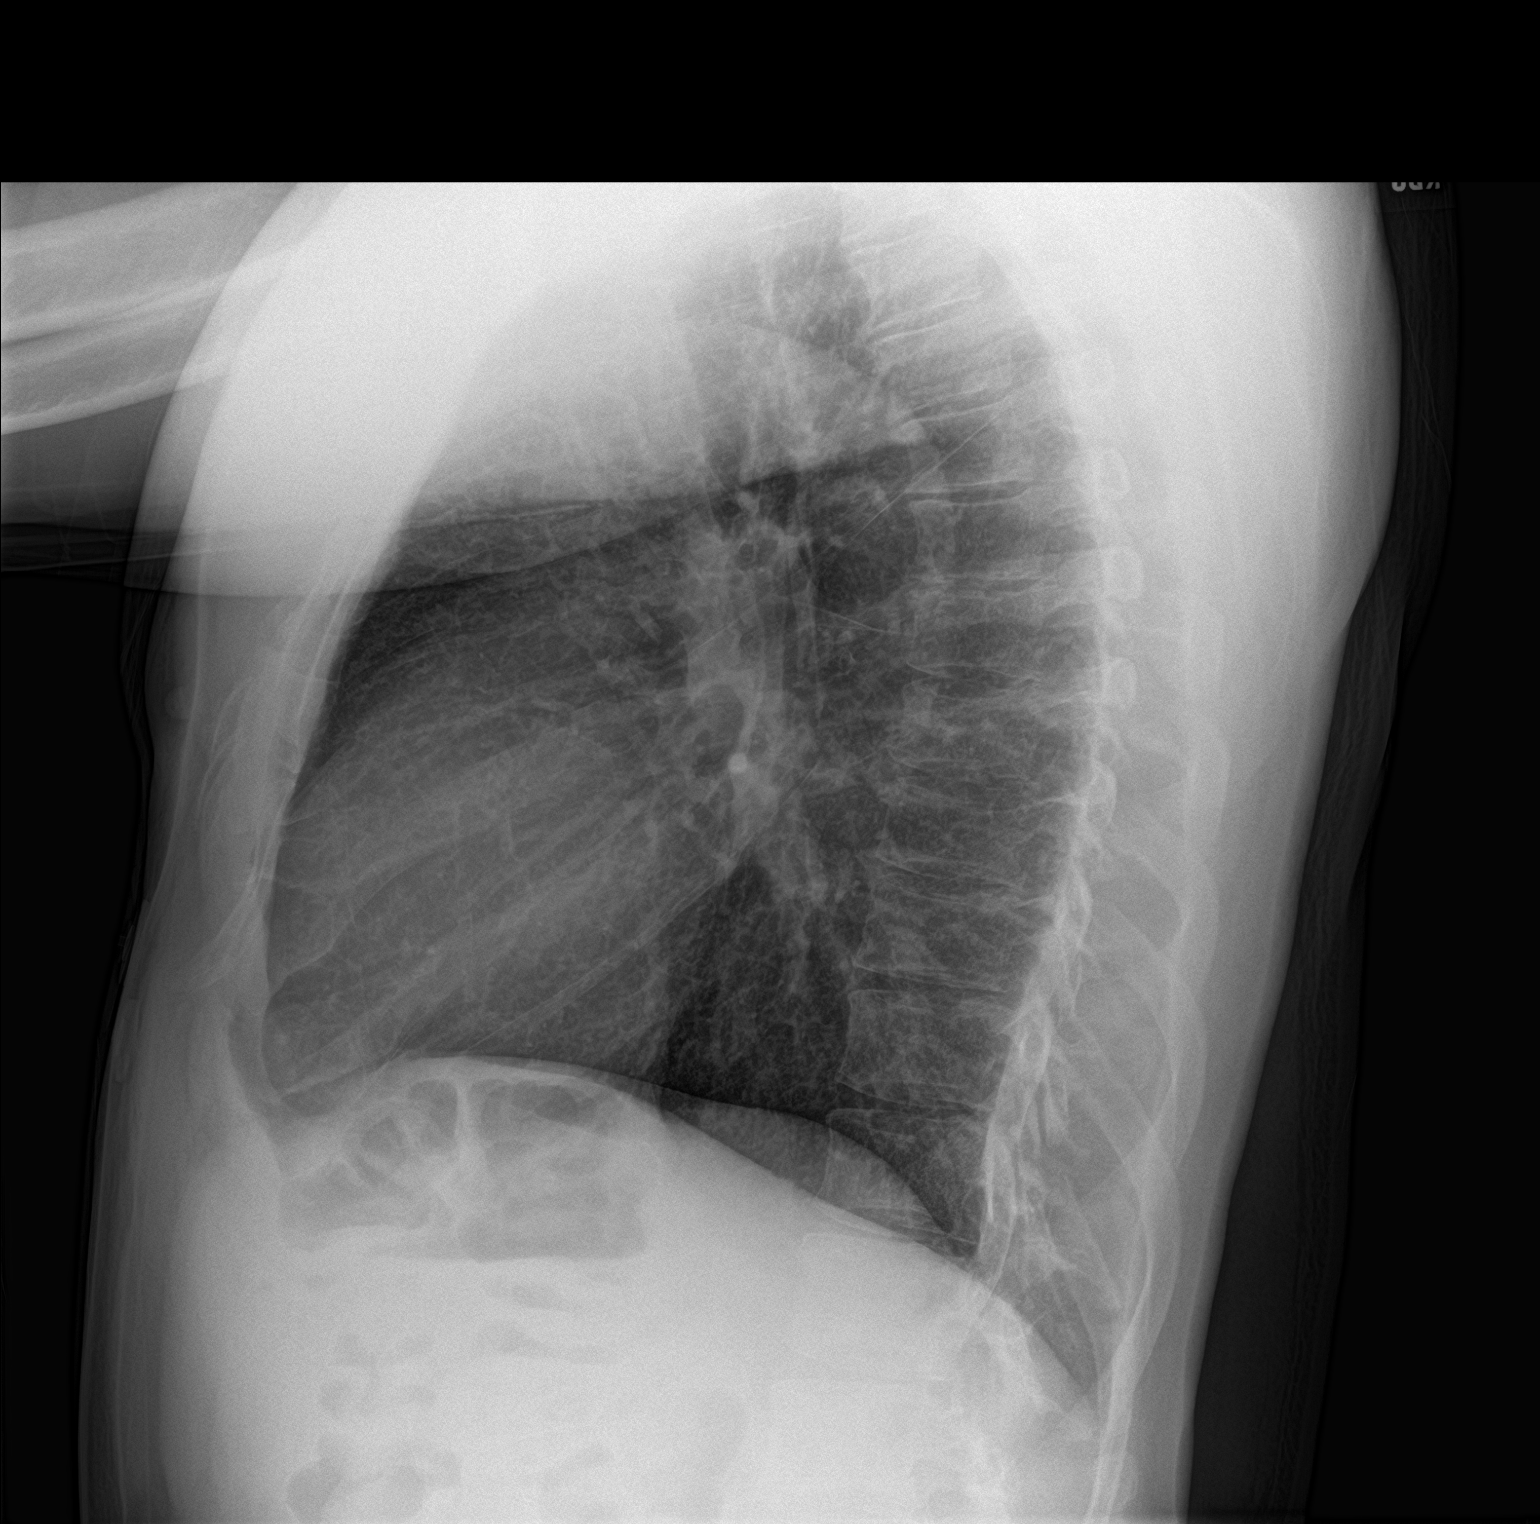

[2 of 2 positions shown; findings below may reference images not displayed]

FINDINGS: Lungs are clear.  No pleural effusion or pneumothorax.

Nipple shadows overlying the bilateral lower lungs.

The heart is normal in size.

Mild degenerative changes of the visualized thoracolumbar spine.
IMPRESSION: No evidence of acute cardiopulmonary disease.

## 2019-01-15 MED FILL — SPIRONOLACTONE 25 MG TABLET: 25 | 30 days supply | Qty: 30 | Fill #2

## 2019-01-18 ENCOUNTER — Other Ambulatory Visit: Payer: Self-pay | Admitting: Nurse Practitioner

## 2019-01-18 ENCOUNTER — Other Ambulatory Visit: Payer: Self-pay | Admitting: Critical Care Medicine

## 2019-01-18 DIAGNOSIS — E1142 Type 2 diabetes mellitus with diabetic polyneuropathy: Secondary | ICD-10-CM

## 2019-01-18 MED FILL — BIDIL TABLET: 20-37.5 | 30 days supply | Qty: 45 | Fill #4

## 2019-01-21 MED FILL — metFORMIN HCL 500 MG TABS: 500 | 30 days supply | Qty: 60 | Fill #0

## 2019-01-23 ENCOUNTER — Other Ambulatory Visit: Payer: Self-pay | Admitting: *Deleted

## 2019-01-23 MED ORDER — NAPROXEN 375 MG PO TABS: 375.0000 mg | ORAL_TABLET | Freq: Two times a day (BID) | ORAL | 2 refills | Status: DC | PRN

## 2019-01-24 DIAGNOSIS — Z23 Encounter for immunization: Secondary | ICD-10-CM | POA: Diagnosis not present

## 2019-01-24 MED FILL — NAPROXEN 375 MG TABLET: 375 | 30 days supply | Qty: 60 | Fill #0

## 2019-01-25 MED FILL — CLOPIDOGREL 75 MG TABLET: 75 | 90 days supply | Qty: 90 | Fill #1

## 2019-01-25 MED FILL — AMLODIPINE BESYLATE 10 MG T: 10 | 30 days supply | Qty: 30 | Fill #4

## 2019-01-30 ENCOUNTER — Encounter (HOSPITAL_COMMUNITY): Payer: Medicaid Other

## 2019-02-12 ENCOUNTER — Telehealth (HOSPITAL_COMMUNITY): Payer: Self-pay | Admitting: Licensed Clinical Social Worker

## 2019-02-12 NOTE — Telephone Encounter (Signed)
CSW had been consulted regarding pt multiple appt cancellations- CSW called pt to confirm he is still able to come to 10/8 appt- pt confirms he is aware of appt and is still able to attend.  Pt also wishes to discuss housing- pt states he is currently living in a trailer that does not have regular electricity and doesn't have running water (or access to it) and is hopeful to get assistance in finding housing.  Pt is agreeable to CSW making referral to the [employer]- referral made.  CSW will also meet with pt on Thursday to discuss further.  CSW will continue to follow and assist as needed  Jorge Ny, [facility] Desk#: [phone] Cell#: [phone]

## 2019-02-14 ENCOUNTER — Ambulatory Visit (HOSPITAL_COMMUNITY)
Admission: RE | Admit: 2019-02-14 | Discharge: 2019-02-14 | Disposition: A | Discharge status: Home / Self Care | Payer: Medicaid Other | Source: Ambulatory Visit | Attending: Adult Health | Admitting: Adult Health

## 2019-02-14 ENCOUNTER — Other Ambulatory Visit: Payer: Self-pay

## 2019-02-14 ENCOUNTER — Encounter (HOSPITAL_COMMUNITY): Payer: Self-pay

## 2019-02-14 VITALS — BP 143/68 | HR 74 | Wt 199.0 lb

## 2019-02-14 DIAGNOSIS — Z8673 Personal history of transient ischemic attack (TIA), and cerebral infarction without residual deficits: Secondary | ICD-10-CM | POA: Diagnosis not present

## 2019-02-14 DIAGNOSIS — E785 Hyperlipidemia, unspecified: Secondary | ICD-10-CM | POA: Insufficient documentation

## 2019-02-14 DIAGNOSIS — I110 Hypertensive heart disease with heart failure: Secondary | ICD-10-CM | POA: Diagnosis not present

## 2019-02-14 DIAGNOSIS — Z7902 Long term (current) use of antithrombotics/antiplatelets: Secondary | ICD-10-CM | POA: Insufficient documentation

## 2019-02-14 DIAGNOSIS — F17210 Nicotine dependence, cigarettes, uncomplicated: Secondary | ICD-10-CM | POA: Diagnosis not present

## 2019-02-14 DIAGNOSIS — Z7982 Long term (current) use of aspirin: Secondary | ICD-10-CM | POA: Insufficient documentation

## 2019-02-14 DIAGNOSIS — I5022 Chronic systolic (congestive) heart failure: Secondary | ICD-10-CM | POA: Insufficient documentation

## 2019-02-14 DIAGNOSIS — I10 Essential (primary) hypertension: Secondary | ICD-10-CM | POA: Diagnosis not present

## 2019-02-14 DIAGNOSIS — Z79899 Other long term (current) drug therapy: Secondary | ICD-10-CM | POA: Diagnosis not present

## 2019-02-14 DIAGNOSIS — E119 Type 2 diabetes mellitus without complications: Secondary | ICD-10-CM | POA: Insufficient documentation

## 2019-02-14 DIAGNOSIS — Z7984 Long term (current) use of oral hypoglycemic drugs: Secondary | ICD-10-CM | POA: Diagnosis not present

## 2019-02-14 DIAGNOSIS — I428 Other cardiomyopathies: Secondary | ICD-10-CM | POA: Diagnosis not present

## 2019-02-14 LAB — BRAIN NATRIURETIC PEPTIDE: B Natriuretic Peptide: 25.9 pg/mL (ref 0.0–100.0)

## 2019-02-14 MED ORDER — CARVEDILOL 3.125 MG PO TABS: 3.1250 mg | ORAL_TABLET | Freq: Two times a day (BID) | ORAL | 2 refills | Status: DC

## 2019-02-14 MED ORDER — ATORVASTATIN CALCIUM 80 MG PO TABS: 80.0000 mg | ORAL_TABLET | Freq: Every day | ORAL | 2 refills | Status: DC

## 2019-02-14 MED ORDER — SPIRONOLACTONE 25 MG PO TABS: 25.0000 mg | ORAL_TABLET | Freq: Every day | ORAL | 2 refills | Status: DC

## 2019-02-14 MED ORDER — AMLODIPINE BESYLATE 10 MG PO TABS: 10.0000 mg | ORAL_TABLET | Freq: Every day | ORAL | 2 refills | Status: DC

## 2019-02-14 MED ORDER — CLOPIDOGREL BISULFATE 75 MG PO TABS: 75.0000 mg | ORAL_TABLET | Freq: Every day | ORAL | 6 refills | Status: DC

## 2019-02-14 MED ORDER — BIDIL 20-37.5 MG PO TABS: 1.0000 | ORAL_TABLET | Freq: Three times a day (TID) | ORAL | 3 refills | Status: DC

## 2019-02-14 NOTE — Addendum Note (Signed)
Encounter addended by: Jorge Ny, LCSW on: 02/14/2019 1:06 PM  Actions taken: Clinical Note Signed

## 2019-02-14 NOTE — Progress Notes (Signed)
Advanced Heart Failure Clinic Note   Referring Physician: PCP: [name], NP PCP-Cardiologist: Dr. Haroldine Laws   HPI:  58 y/o AA male with multiple medical problems including substance abuse (tobacco, ETOH and cocaine), HCV, HTN, HL, DM2, previous and TIA. Had angioedema with Benicar.  Has h/o systolic HF dating back to 2011. Echo 02/2010 with EF 50-55%  Admitted to [facility] 9/19 with HF. EF 25-30%. Had cath. No CAD.   Admitted to [facility] 12/19 with recurrent HF and HTN. Saw Dr. Joya Gaskins as outpatient and echo done 07/03/18 EF, 25-30%. His cardiomyopathy is felt to primarily be 2/2 long standing, poorly controlled HTN.  He had initial consultation in [facility] 07/25/18. Was doing well w/o symptoms. Classified as NYHA class I. He was still smoking 1-2 cigarettes per day. Admitted to drinking two 24 ounce beers per day. Reported abstinence from cocaine. Denied CP or pressure. His BP was elevated at visit, at 144/108.  He was continued on coreg and spironolactone. Bidil was started. He was advised to f/u w/ pharmacist for further med titration but failed to f/u. Multiple cancellations since last OV. CSW reached out to him and discovered he has had issues w/ housing. They are now involved in his care.   Today in f/u, he reports that he has done well. Denies resting dyspnea. No orthopnea/PND or LEE. No exertional dyspnea with mild-moderate activities. Denies CP. Has quit drinking. Denies cocaine. Still smoking 3-4 cigarettes daily. Since his last clinic visit, his PCP added amlodipine 10 mg daily. BP is elevated today at 143/86. HR 74 bpm.  Reports full compliance w/ meds.      Review of Systems: [y] = yes, [ ]  = no   General: Weight gain [ ] ; Weight loss [ ] ; Anorexia [ ] ; Fatigue [ ] ; Fever [ ] ; Chills [ ] ; Weakness [ ]   Cardiac: Chest pain/pressure [ ] ; Resting SOB [ ] ; Exertional SOB [ ] ; Orthopnea [ ] ; Pedal Edema [ ] ; Palpitations [ ] ; Syncope [ ] ; Presyncope [ ] ; Paroxysmal nocturnal dyspnea[ ]    Pulmonary: Cough [ ] ; Wheezing[ ] ; Hemoptysis[ ] ; Sputum [ ] ; Snoring [ ]   GI: Vomiting[ ] ; Dysphagia[ ] ; Melena[ ] ; Hematochezia [ ] ; Heartburn[ ] ; Abdominal pain [ ] ; Constipation [ ] ; Diarrhea [ ] ; BRBPR [ ]   GU: Hematuria[ ] ; Dysuria [ ] ; Nocturia[ ]   Vascular: Pain in legs with walking [ ] ; Pain in feet with lying flat [ ] ; Non-healing sores [ ] ; Stroke [ ] ; TIA [ ] ; Slurred speech [ ] ;  Neuro: Headaches[ ] ; Vertigo[ ] ; Seizures[ ] ; Paresthesias[ ] ;Blurred vision [ ] ; Diplopia [ ] ; Vision changes [ ]   Ortho/Skin: Arthritis [ ] ; Joint pain [ ] ; Muscle pain [ ] ; Joint swelling [ ] ; Back Pain [ ] ; Rash [ ]   Psych: Depression[ ] ; Anxiety[ ]   Heme: Bleeding problems [ ] ; Clotting disorders [ ] ; Anemia [ ]   Endocrine: Diabetes [ ] ; Thyroid dysfunction[ ]    Past Medical History:  Diagnosis Date  . Diabetes mellitus without complication ([place])   . Heart failure ([place])   . Hepatitis C   . History of CVA (cerebrovascular accident) 02/2010   Left periventricular subcortical ischemic infarction // Residual RUE and RLE weakness  . History of depression    surrounding original stroke - has since resolved (04/2012)  . Hyperlipidemia   . Hypertension   . Internal carotid artery stenosis    BL and mild per CT angiogram (05/2010)  . Stroke ([place]) 2012  . Tobacco abuse  Current Outpatient Medications  Medication Sig Dispense Refill  . Accu-Chek FastClix Lancets MISC Use as instructed. Inject into the skin once daily. E11.9 100 each 3  . amLODipine (NORVASC) 10 MG tablet Take 1 tablet (10 mg total) by mouth daily. 90 tablet 2  . aspirin 81 MG tablet Take 1 tablet (81 mg total) by mouth daily. 30 tablet 5  . atorvastatin (LIPITOR) 80 MG tablet TAKE 1 TABLET (80 MG TOTAL) BY MOUTH DAILY. 30 tablet 2  . Blood Glucose Calibration (ACCU-CHEK GUIDE CONTROL) LIQD 1 each by In Vitro route once as needed for up to 1 dose. E11.9 1 each 0  . Blood Pressure Monitor DEVI Please provide patient with  insurance approved blood pressure monitor 1 Device 0  . carvedilol (COREG) 3.125 MG tablet TAKE 1 TABLET (3.125 MG TOTAL) BY MOUTH 2 (TWO) TIMES DAILY WITH A MEAL. 60 tablet 2  . clopidogrel (PLAVIX) 75 MG tablet Take 1 tablet (75 mg total) by mouth daily. 30 tablet 6  . glucose blood (ACCU-CHEK GUIDE) test strip Use as instructed. Check blood glucose by fingerstick once per day.  E11.9 100 each 12  . isosorbide-hydrALAZINE (BIDIL) 20-37.5 MG tablet Take 0.5 tablets by mouth 3 (three) times daily. 45 tablet 6  . metFORMIN (GLUCOPHAGE) 500 MG tablet TAKE 1 TABLET (500 MG TOTAL) BY MOUTH 2 (TWO) TIMES DAILY WITH A MEAL. 60 tablet 2  . naproxen (NAPROSYN) 375 MG tablet Take 1 tablet (375 mg total) by mouth 2 (two) times daily as needed for moderate pain. 60 tablet 2  . Sofosbuvir-Velpatasvir (EPCLUSA) 400-100 MG TABS Take 1 tablet by mouth daily. Take 1 tablet by mouth daily. 28 tablet 2  . spironolactone (ALDACTONE) 25 MG tablet Take 1 tablet (25 mg total) by mouth daily. 30 tablet 2   No current facility-administered medications for this encounter.     Allergies  Allergen Reactions  . Benicar [Olmesartan] Anaphylaxis and Swelling    Patient cannot name the site of swelling (??)  . Ace Inhibitors Swelling    Patient cannot name the site of swelling (??)  . Angiotensin Receptor Blockers Swelling    Patient cannot name the site of swelling (??)      Social History   Socioeconomic History  . Marital status: Married    Spouse name: Not on file  . Number of children: 1  . Years of education: 11th grade  . Highest education level: Not on file  Occupational History  . Occupation: On disability    Comment: previously worked for [employer] until his stroke in 2011.   Social Needs  . Financial resource strain: Not on file  . Food insecurity    Worry: Not on file    Inability: Not on file  . Transportation needs    Medical: Not on file    Non-medical: Not on file  Tobacco Use  .  Smoking status: Current Some Day Smoker    Packs/day: 0.30    Years: 31.00    Pack years: 9.30    Types: Cigarettes    Start date: 04/25/1981  . Smokeless tobacco: Never Used  . Tobacco comment: up to 3 cigs/day; cutting back.  Substance and Sexual Activity  . Alcohol use: Not Currently    Comment: occasional  . Drug use: No    Comment: Former smoke crack   . Sexual activity: Yes  Lifestyle  . Physical activity    Days per week: Not on file    Minutes  per session: Not on file  . Stress: Not on file  Relationships  . Social Herbalist on phone: Not on file    Gets together: Not on file    Attends religious service: Not on file    Active member of club or organization: Not on file    Attends meetings of clubs or organizations: Not on file    Relationship status: Not on file  . Intimate partner violence    Fear of current or ex partner: Not on file    Emotionally abused: Not on file    Physically abused: Not on file    Forced sexual activity: Not on file  Other Topics Concern  . Not on file  Social History Narrative   Lives in [place] with wife.   Has been incarcerated multiple times, last time was in 2012 for 8 months.      Family History  Problem Relation Age of Onset  . Diabetes Mother   . Heart failure Mother   . Hypertension Mother   . Hyperlipidemia Mother   . Glaucoma Mother   . Lung cancer Father   . Hypertension Brother   . Diabetes Brother   . Pancreatic cancer Brother   . Diabetes Sister   . Arthritis Sister     Vitals:   02/14/19 1129  BP: (!) 143/68  Pulse: 74  SpO2: 98%  Weight: 90.3 kg (199 lb)     PHYSICAL EXAM: PHYSICAL EXAM: General:  Well appearing. No respiratory difficulty HEENT: normal Neck: supple. no JVD. Carotids 2+ bilat; no bruits. No lymphadenopathy or thyromegaly appreciated. Cor: PMI nondisplaced. Regular rate & rhythm. No rubs, gallops or murmurs. Lungs: clear Abdomen: soft, nontender, nondistended. No  hepatosplenomegaly. No bruits or masses. Good bowel sounds. Extremities: no cyanosis, clubbing, rash, edema Neuro: alert & oriented x 3, cranial nerves grossly intact. moves all 4 extremities w/o difficulty. Affect pleasant.   ECG: not performed today    ASSESSMENT & PLAN:  1. Chronic systolic HF due to NICM - Echo 9/19 EF 25-30%, Echo 2/20 EF 25-30% - Cath 9/19 [facility]. No CAD - Suspect due to HTN and substance use - NYHA I - Volume status looks good - Continue carvedilol 3.125 bid - Continue spironolactone 25 mg daily  - No ACE/ARB/ARNI with angioedema to Benicar in past - Increase Bidil to 20-37.5 tid - Check BMP today  - Refer to PharmD Clinic for med titration in 2-3 weeks - Consider adding an SGLT2i in 2-3 weeks ([place]) - Limit ETOH and substance abuse - No ICD at this point with NYHA I   2. HTN: elevated at 143/68. HR 74.  - poorly controlled - increase Bidil to 20-37.5 tid - Continue Coreg 3.125 bid - Continue spironolactone 25 daily -Discussed w/ Dr. Haroldine Laws. Ok to continue amlodipine for now, but will try to wean off. -F/u with pharmD in 2-3 weeks. Can try adding an SGLT2i Larey Dresser) at next visit, which will also help w/ BP.   3. Substance abuse - reinforced need for cessation -still smoking 3-4 cigarettes daily -no further ETOH drinking -denies cocaine use.   4. CVA -Continue DAPT w/ ASA and Plavix  -Continue statin   5. HCV - was referred to ID for treatment.  -he reports he has completed treatment.    Lyda Jester, PA-C 02/14/19

## 2019-02-14 NOTE — Patient Instructions (Signed)
Labs were done today. We will ONLY call you if there is something ABNORMAL. NO CALL IS A GOOD CALL!!!   Medication Change:   Bidil - Increased to 37.5 ( 1 whole tablet ) three times a day  All Heart Medications were refilled today and sent to [facility] on [address].  Your Provider would like for you to have an appointment with Pharmacy in 2-3 weeks.  Your Provider would like to see you back in the clinic in 6 - 8 weeks with Dr. Haroldine Laws.  At the [facility], you and your health needs are our priority. As part of our continuing mission to provide you with exceptional heart care, we have created designated Provider Care Teams. These Care Teams include your primary Cardiologist (physician) and Advanced Practice Providers (APPs- Physician Assistants and Nurse Practitioners) who all work together to provide you with the care you need, when you need it.   You may see any of the following providers on your designated Care Team at your next follow up: Marland Kitchen Dr Glori Bickers . Dr Loralie Champagne . Darrick Grinder, NP . Lyda Jester, PA   Please be sure to bring in all your medications bottles to every appointment.

## 2019-02-14 NOTE — Progress Notes (Signed)
CSW met with pt to discuss housing.  Pt currently paying rent to stay in a trailer behind an acquaintances house.  Pt does not have electricity at this location nor running water.  States he buys food each day and just doesn't get things that don't spoil and can sometimes shower at his sisters house.  Pt is interested in getting more permanent housing.  Housing coalition has already reached out to him and is sending him a list of local apartment options.  CSW also printed off list from [employer] serve of local apartments that are within pt price range.  Pt to call and follow up with apartment sites and call CSW if he runs into issues- is concerned about paying for start up costs.  Pt also states he is having trouble affording medications at this time.  Pt gets medications through [facility] who are unable to waive his copays.  CSW discussed transferring medications to [facility] near his home where he should be able to request that his copays be waived since Medicaid is his primary payor source- pt in agreement.  CSW called [facility] to inform of pt desire to transfer meds to their store and they will call [facility] to request transfer.  CSW will continue to follow and assist as needed  Jorge Ny, [facility] Desk#: [phone] Cell#: [phone]

## 2019-02-27 ENCOUNTER — Ambulatory Visit: Payer: Medicaid Other | Admitting: Pharmacist

## 2019-03-07 ENCOUNTER — Inpatient Hospital Stay (HOSPITAL_COMMUNITY): Admission: RE | Admit: 2019-03-07 | Payer: Medicaid Other | Source: Ambulatory Visit

## 2019-03-11 ENCOUNTER — Ambulatory Visit: Payer: Medicaid Other | Admitting: Nurse Practitioner

## 2019-03-12 ENCOUNTER — Ambulatory Visit: Payer: Medicaid Other | Attending: Nurse Practitioner | Admitting: Nurse Practitioner

## 2019-03-12 ENCOUNTER — Encounter: Payer: Self-pay | Admitting: Nurse Practitioner

## 2019-03-12 ENCOUNTER — Other Ambulatory Visit: Payer: Self-pay

## 2019-03-12 DIAGNOSIS — I509 Heart failure, unspecified: Secondary | ICD-10-CM | POA: Diagnosis not present

## 2019-03-12 DIAGNOSIS — I110 Hypertensive heart disease with heart failure: Secondary | ICD-10-CM | POA: Insufficient documentation

## 2019-03-12 DIAGNOSIS — Z8673 Personal history of transient ischemic attack (TIA), and cerebral infarction without residual deficits: Secondary | ICD-10-CM | POA: Diagnosis not present

## 2019-03-12 DIAGNOSIS — Z7984 Long term (current) use of oral hypoglycemic drugs: Secondary | ICD-10-CM | POA: Insufficient documentation

## 2019-03-12 DIAGNOSIS — Z801 Family history of malignant neoplasm of trachea, bronchus and lung: Secondary | ICD-10-CM | POA: Diagnosis not present

## 2019-03-12 DIAGNOSIS — E1142 Type 2 diabetes mellitus with diabetic polyneuropathy: Secondary | ICD-10-CM | POA: Diagnosis not present

## 2019-03-12 DIAGNOSIS — E7800 Pure hypercholesterolemia, unspecified: Secondary | ICD-10-CM | POA: Insufficient documentation

## 2019-03-12 DIAGNOSIS — Z79899 Other long term (current) drug therapy: Secondary | ICD-10-CM | POA: Insufficient documentation

## 2019-03-12 DIAGNOSIS — Z833 Family history of diabetes mellitus: Secondary | ICD-10-CM | POA: Insufficient documentation

## 2019-03-12 DIAGNOSIS — E119 Type 2 diabetes mellitus without complications: Secondary | ICD-10-CM | POA: Diagnosis present

## 2019-03-12 DIAGNOSIS — F17210 Nicotine dependence, cigarettes, uncomplicated: Secondary | ICD-10-CM | POA: Insufficient documentation

## 2019-03-12 DIAGNOSIS — Z800 Family history of malignant neoplasm of digestive organs: Secondary | ICD-10-CM | POA: Insufficient documentation

## 2019-03-12 DIAGNOSIS — I10 Essential (primary) hypertension: Secondary | ICD-10-CM

## 2019-03-12 DIAGNOSIS — Z8261 Family history of arthritis: Secondary | ICD-10-CM | POA: Insufficient documentation

## 2019-03-12 DIAGNOSIS — Z821 Family history of blindness and visual loss: Secondary | ICD-10-CM | POA: Diagnosis not present

## 2019-03-12 DIAGNOSIS — Z8249 Family history of ischemic heart disease and other diseases of the circulatory system: Secondary | ICD-10-CM | POA: Diagnosis not present

## 2019-03-12 MED ORDER — ATORVASTATIN CALCIUM 80 MG PO TABS: 80.0000 mg | ORAL_TABLET | Freq: Every day | ORAL | 2 refills | Status: DC

## 2019-03-12 MED ORDER — METFORMIN HCL 500 MG PO TABS: 500.0000 mg | ORAL_TABLET | Freq: Two times a day (BID) | ORAL | 1 refills | Status: DC

## 2019-03-12 MED ORDER — ACCU-CHEK GUIDE VI STRP: ORAL_STRIP | 12 refills | Status: DC

## 2019-03-12 MED ORDER — ACCU-CHEK FASTCLIX LANCETS MISC: 3 refills | Status: DC

## 2019-03-12 NOTE — Progress Notes (Signed)
Virtual Visit via Telephone Note Due to national recommendations of social distancing due to [place] 19, telehealth visit is felt to be most appropriate for this patient at this time.  I discussed the limitations, risks, security and privacy concerns of performing an evaluation and management service by telephone and the availability of in person appointments. I also discussed with the patient that there may be a patient responsible charge related to this service. The patient expressed understanding and agreed to proceed.    I connected with Kenn File on 03/12/19  at   9:10 AM EST  EDT by telephone and verified that I am speaking with the correct person using two identifiers.   Consent I discussed the limitations, risks, security and privacy concerns of performing an evaluation and management service by telephone and the availability of in person appointments. I also discussed with the patient that there may be a patient responsible charge related to this service. The patient expressed understanding and agreed to proceed.   Location of Patient: Private  Residence   Location of Provider: [place] [facility] [employer] Office    Persons participating in Telemedicine visit: Geryl Rankins FNP-BC [place]    History of Present Illness: Telemedicine visit for: Follow up  has a past medical history of Diabetes mellitus without complication ([place]), Heart failure ([place]), Hepatitis C, History of CVA (cerebrovascular accident) (02/2010), History of depression, Hyperlipidemia, Hypertension, Internal carotid artery stenosis, Stroke ([place]) (2012), and Tobacco abuse.  History of Present Illness: Telemedicine visit for: Follow up  has a past medical history of Diabetes mellitus without complication ([place]), Heart failure ([place]), Hepatitis C, History of CVA (cerebrovascular accident) (02/2010), History of depression, Hyperlipidemia, Hypertension, Internal carotid artery stenosis,  Stroke ([place]) (2012), and Tobacco abuse.  Has appointment with Cardiology in the next upcoming week.   Essential Hypertension  Has a blood pressure cuff at home however has not been monitoring his blood pressure.  Endorses medication compliance taking amlodipine 10 mg daily, carvedilol 3.125 mg twice daily, BiDil 20-37.5 mg 3 times daily and spironolactone 25 mg daily. Denies chest pain, shortness of breath, palpitations, lightheadedness, dizziness, headaches or BLE edema.  BP Readings from Last 3 Encounters:  02/14/19 (!) 143/68  12/11/18 (!) 167/95  12/07/18 115/69   DM TYPE 2 Has been well controlled however Mr. Josh states he has not been monitoring his blood glucose levels consistently at home and cannot recall any recent readings.  He does endorse medication compliance taking Metformin 500 mg twice daily.  On atorvastatin 80 mg daily with LDL slightly above goal of 70 @ 72.  Intolerance to ACE and ARB.  He denies any hypo glycemic symptoms of diabetes however endorses peripheral neuropathy.  Requesting referral to podiatry today due to split toenail on the right big toe. Lab Results  Component Value Date   HGBA1C 6.0 (A) 12/07/2018   Lab Results  Component Value Date   LDLCALC 72 06/12/2018    Past Medical History:  Diagnosis Date  . Diabetes mellitus without complication ([place])   . Heart failure ([place])   . Hepatitis C   . History of CVA (cerebrovascular accident) 02/2010   Left periventricular subcortical ischemic infarction // Residual RUE and RLE weakness  . History of depression    surrounding original stroke - has since resolved (04/2012)  . Hyperlipidemia   . Hypertension   . Internal carotid artery stenosis    BL and mild per CT angiogram (05/2010)  . Stroke ([place]) 2012  .  Tobacco abuse     History reviewed. No pertinent surgical history.  Family History  Problem Relation Age of Onset  . Diabetes Mother   . Heart failure Mother   . Hypertension Mother   .  Hyperlipidemia Mother   . Glaucoma Mother   . Lung cancer Father   . Hypertension Brother   . Diabetes Brother   . Pancreatic cancer Brother   . Diabetes Sister   . Arthritis Sister     Social History   Socioeconomic History  . Marital status: Married    Spouse name: Not on file  . Number of children: 1  . Years of education: 11th grade  . Highest education level: Not on file  Occupational History  . Occupation: On disability    Comment: previously worked for [employer] until his stroke in 2011.   Social Needs  . Financial resource strain: Not on file  . Food insecurity    Worry: Not on file    Inability: Not on file  . Transportation needs    Medical: Not on file    Non-medical: Not on file  Tobacco Use  . Smoking status: Current Some Day Smoker    Packs/day: 0.30    Years: 31.00    Pack years: 9.30    Types: Cigarettes    Start date: 04/25/1981  . Smokeless tobacco: Never Used  . Tobacco comment: up to 3 cigs/day; cutting back.  Substance and Sexual Activity  . Alcohol use: Not Currently    Comment: occasional  . Drug use: No    Comment: Former smoke crack   . Sexual activity: Yes  Lifestyle  . Physical activity    Days per week: Not on file    Minutes per session: Not on file  . Stress: Not on file  Relationships  . Social Herbalist on phone: Not on file    Gets together: Not on file    Attends religious service: Not on file    Active member of club or organization: Not on file    Attends meetings of clubs or organizations: Not on file    Relationship status: Not on file  Other Topics Concern  . Not on file  Social History Narrative   Lives in [place] with wife.   Has been incarcerated multiple times, last time was in 2012 for 8 months.     Observations/Objective: Awake, alert and oriented x 3   Review of Systems  Constitutional: Negative for fever, malaise/fatigue and weight loss.  HENT: Negative.  Negative for nosebleeds.    Eyes: Negative.  Negative for blurred vision, double vision and photophobia.  Respiratory: Negative.  Negative for cough and shortness of breath.   Cardiovascular: Negative.  Negative for chest pain, palpitations and leg swelling.  Gastrointestinal: Negative.  Negative for heartburn, nausea and vomiting.  Musculoskeletal: Negative.  Negative for myalgias.  Neurological: Negative.  Negative for dizziness, focal weakness, seizures and headaches.  Psychiatric/Behavioral: Negative.  Negative for suicidal ideas.    Assessment and Plan: He requested to have his medications sent to [facility] at the [address] in [place]  Diagnoses and all orders for this visit:  Controlled type 2 diabetes mellitus with diabetic polyneuropathy, without long-term current use of insulin ([place]) -     Ambulatory referral to Ophthalmology -     metFORMIN (GLUCOPHAGE) 500 MG tablet; Take 1 tablet (500 mg total) by mouth 2 (two) times daily with a meal. -  Ambulatory referral to Podiatry Continue blood sugar control as discussed in office today, low carbohydrate diet, and regular physical exercise as tolerated, 150 minutes per week (30 min each day, 5 days per week, or 50 min 3 days per week). Keep blood sugar logs with fasting goal of 90-130 mg/dl, post prandial (after you eat) less than 180.  For Hypoglycemia: BS <60 and Hyperglycemia BS >400; contact the clinic ASAP. Annual eye exams and foot exams are recommended.   Essential hypertension Continue all antihypertensives as prescribed.  Remember to bring in your blood pressure log with you for your follow up appointment.  DASH/Mediterranean Diets are healthier choices for HTN.   Pure hypercholesterolemia -     atorvastatin (LIPITOR) 80 MG tablet; Take 1 tablet (80 mg total) by mouth daily. INSTRUCTIONS: Work on a low fat, heart healthy diet and participate in regular aerobic exercise program by working out at least 150 minutes per week; 5 days  a week-30 minutes per day. Avoid red meat/beef/steak,  fried foods. junk foods, sodas, sugary drinks, unhealthy snacking, alcohol and smoking.  Drink at least 80 oz of water per day and monitor your carbohydrate intake daily.      Follow Up Instructions Return in about 2 months (around 05/12/2019).     I discussed the assessment and treatment plan with the patient. The patient was provided an opportunity to ask questions and all were answered. The patient agreed with the plan and demonstrated an understanding of the instructions.   The patient was advised to call back or seek an in-person evaluation if the symptoms worsen or if the condition fails to improve as anticipated.  I provided 18 minutes of non-face-to-face time during this encounter including median intraservice time, reviewing previous notes, labs, imaging, medications and explaining diagnosis and management.  [name], FNP-BC

## 2019-03-20 ENCOUNTER — Ambulatory Visit (HOSPITAL_COMMUNITY)
Admission: RE | Admit: 2019-03-20 | Discharge: 2019-03-20 | Disposition: A | Discharge status: Home / Self Care | Payer: Medicaid Other | Source: Ambulatory Visit | Attending: Cardiology | Admitting: Cardiology

## 2019-03-20 ENCOUNTER — Other Ambulatory Visit: Payer: Self-pay | Admitting: Pharmacist

## 2019-03-20 ENCOUNTER — Other Ambulatory Visit: Payer: Self-pay

## 2019-03-20 ENCOUNTER — Other Ambulatory Visit: Payer: Medicaid Other

## 2019-03-20 DIAGNOSIS — I428 Other cardiomyopathies: Secondary | ICD-10-CM | POA: Insufficient documentation

## 2019-03-20 DIAGNOSIS — E119 Type 2 diabetes mellitus without complications: Secondary | ICD-10-CM | POA: Insufficient documentation

## 2019-03-20 DIAGNOSIS — F1910 Other psychoactive substance abuse, uncomplicated: Secondary | ICD-10-CM | POA: Insufficient documentation

## 2019-03-20 DIAGNOSIS — Z8673 Personal history of transient ischemic attack (TIA), and cerebral infarction without residual deficits: Secondary | ICD-10-CM | POA: Insufficient documentation

## 2019-03-20 DIAGNOSIS — Z7982 Long term (current) use of aspirin: Secondary | ICD-10-CM | POA: Insufficient documentation

## 2019-03-20 DIAGNOSIS — I110 Hypertensive heart disease with heart failure: Secondary | ICD-10-CM | POA: Diagnosis not present

## 2019-03-20 DIAGNOSIS — I5022 Chronic systolic (congestive) heart failure: Secondary | ICD-10-CM | POA: Insufficient documentation

## 2019-03-20 DIAGNOSIS — B1920 Unspecified viral hepatitis C without hepatic coma: Secondary | ICD-10-CM | POA: Diagnosis not present

## 2019-03-20 DIAGNOSIS — Z79899 Other long term (current) drug therapy: Secondary | ICD-10-CM | POA: Diagnosis not present

## 2019-03-20 DIAGNOSIS — Z7984 Long term (current) use of oral hypoglycemic drugs: Secondary | ICD-10-CM | POA: Insufficient documentation

## 2019-03-20 DIAGNOSIS — B182 Chronic viral hepatitis C: Secondary | ICD-10-CM

## 2019-03-20 DIAGNOSIS — F17210 Nicotine dependence, cigarettes, uncomplicated: Secondary | ICD-10-CM | POA: Diagnosis not present

## 2019-03-20 DIAGNOSIS — Z7902 Long term (current) use of antithrombotics/antiplatelets: Secondary | ICD-10-CM | POA: Diagnosis not present

## 2019-03-20 MED ORDER — BIDIL 20-37.5 MG PO TABS: 2.0000 | ORAL_TABLET | Freq: Three times a day (TID) | ORAL | 6 refills | Status: DC

## 2019-03-20 NOTE — Patient Instructions (Signed)
It was a pleasure seeing you today!  MEDICATIONS: -We are changing your medications today -Increase Bidil to 2 tablets three times daily.  -Call if you have questions about your medications.  NEXT APPOINTMENT: Return to clinic in 1 week with Dr. Haroldine Laws.  In general, to take care of your heart failure: -Limit your fluid intake to 2 Liters (half-gallon) per day.   -Limit your salt intake to ideally 2-3 grams (2000-3000 mg) per day. -Weigh yourself daily and record, and bring that "weight diary" to your next appointment.  (Weight gain of 2-3 pounds in 1 day typically means fluid weight.) -The medications for your heart are to help your heart and help you live longer.   -Please contact us before stopping any of your heart medications.  Call the clinic at [phone] with questions or to reschedule future appointments.

## 2019-03-20 NOTE — Progress Notes (Signed)
Hep C lab order

## 2019-03-21 NOTE — Progress Notes (Signed)
Referring Physician: PCP: [name], NP PCP-Cardiologist: Dr. Haroldine Laws   HPI:  58 y/o AA male with multiple medical problems including substance abuse (tobacco, ETOH and cocaine), HCV, HTN, HL, T2DM, previous TIA. Had angioedema with Benicar.  Has h/o systolic HF dating back to 2011. Echo 02/2010 with EF 50-55%  Admitted to [facility] 9/19 with HF. EF 25-30%. Had cath. No CAD.   Admitted to [facility] 12/19 with recurrent HF and HTN. Saw Dr. Joya Gaskins as outpatient and echo done 07/03/18 EF, 25-30%. His cardiomyopathy is felt to primarily be 2/2 long standing, poorly controlled HTN.  He had initial consultation in [facility] 07/25/18. Was doing well w/o symptoms. Classified as NYHA class I. He was still smoking 1-2 cigarettes per day. Admitted to drinking two 24 ounce beers per day. Reported abstinence from cocaine. Denied CP or pressure. His BP was elevated at visit, at 144/108.  He was continued on coreg and spironolactone. Bidil was started. He was advised to f/u w/ pharmacist for further med titration but failed to f/u. Multiple cancellations since last OV. CSW reached out to him and discovered he has had issues w/ housing. They are now involved in his care.   He recently presented to HF Clinic for follow-up on 02/14/19. At that visit, he reported doing well.  Denied resting dyspnea. No orthopnea/PND or LEE. No exertional dyspnea with mild-moderate activities. Denied CP. Has quit drinking. Denied cocaine. Still smoking 3-4 cigarettes daily. Since his last clinic visit, his PCP added amlodipine 10 mg daily. BP is elevated today at 143/86. HR 74 bpm.  Reports full compliance w/ meds.   Today he returns to HF clinic for pharmacist medication titration. At last visit with MD, Bidil was increased to 20-37.5 mg, 1 tablet TID. Overall he is doing well with that change. Noted some dizziness approximately 2 times per week. This occurs most often when moving from a sitting to a standing position. Is not  markedly worse since starting Bidil. He does note some headaches, but these are not bothersome and relieved by acetaminophen. No chest pain or palpitations. No SOB/DOE. He does not weigh himself daily. He is not taking any diuretics. No LEE, PND or orthopnea. He does not follow a low salt diet, but states he does not use a lot of salt in general.     . Shortness of breath/dyspnea on exertion? no  . Orthopnea/PND? no . Edema? no . Lightheadedness/dizziness? no . Daily weights at home? no . Blood pressure/heart rate monitoring at home? no . Following low-sodium/fluid-restricted diet? no  HF Medications: Carvedilol 3.125 mg BID Bidil 20-37.5 mg TID Spironolactone 25 mg daily  Has the patient been experiencing any side effects to the medications prescribed?  Some headaches with Bidil, but not bothersome.   Does the patient have any problems obtaining medications due to transportation or finances?   No - has [place] Medicaid  Understanding of regimen: fair Understanding of indications: fair Potential of compliance: good Patient understands to avoid NSAIDs. Patient understands to avoid decongestants.    Pertinent Lab Values: last drawn 10/22/18 . Serum creatinine 0.99, BUN 15, Potassium 4.0, Sodium 140, BNP 25.9 (02/14/19)  Vital Signs: . Weight: 199.4 lbs (last clinic weight: 199) . Blood pressure: 144/94  . Heart rate: 84   Assessment: 1. 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 No CAD - Suspect due to HTN and substance use - NYHA I - Euvolemic on exam - Vitals  stable: BP 144/94, HR 84 - Continue carvedilol 3.125 mg BID. Noted some daytime fatigue. Unclear if due to carvedilol or heart failure. Consider increasing at next visit.  - No ACE/ARB/ARNI with angioedema to Benicar in past - Continue spironolactone 25 mg daily  - Increase Bidil 20-37.5 to 2 tablets TID. Encouraged TID dosing. He sometimes forgets midday dose.  -  Consider adding an SGLT2i next visit (Farxiga). He will need prior authorization to obtain through [place] Medicaid. Given his reluctance to add new medications today, will defer to next visit.  - Limit ETOH and substance abuse - No ICD at this point with NYHA I  2. HTN: elevated at 149/94. HR 84.  - poorly controlled -Increase Bidil. Continue carvedilol, spironolactone and amlodipine as above.  - Plan to titrate off amlodipine as GDMT for heart failure is increased.  - Can try adding an SGLT2i Larey Dresser) at next visit, which will also help w/ BP.   3. Substance abuse - reinforced need for cessation -still smoking 3-4 cigarettes daily -no further ETOH drinking -denies cocaine use.   4. CVA -Continue DAPT w/ ASA and Plavix  -Continue statin   5. HCV - was referred to ID for treatment. -he reports he has completed treatment with 12 weeks of Epclusa.   6. T2DM  -Well-controlled - last hemoglobin A1C 6.0% (12/07/18) -Continue metformin 500 mg BID -Continue statin -Consider SGLT2i at next visit    Plan: 1) Medication changes: Based on clinical presentation, vital signs and recent labs will Increase Bidil to 2 tablets TID.  2) Follow-up: HF Clinic with Dr. Haroldine Laws in 1 week.    Audry Riles, PharmD, BCPS, CPP Heart Failure Clinic Pharmacist [phone]  Discussed patient with Dr. Lynelle Smoke.  Agree with assessment and plan. Continue amlodipine for BP control as titrate other HF medications, with plans to eventually DC if tolerates GDMT for HF and BP stable.    Bonnita Nasuti Pharm.D. CPP, BCPS Clinical Pharmacist [phone] 03/21/2019 10:55 AM

## 2019-03-22 ENCOUNTER — Encounter: Payer: Self-pay | Admitting: Podiatry

## 2019-03-22 ENCOUNTER — Ambulatory Visit (INDEPENDENT_AMBULATORY_CARE_PROVIDER_SITE_OTHER): Payer: Medicaid Other | Admitting: Podiatry

## 2019-03-22 DIAGNOSIS — M2041 Other hammer toe(s) (acquired), right foot: Secondary | ICD-10-CM

## 2019-03-22 DIAGNOSIS — M21612 Bunion of left foot: Secondary | ICD-10-CM

## 2019-03-22 DIAGNOSIS — M79674 Pain in right toe(s): Secondary | ICD-10-CM

## 2019-03-22 DIAGNOSIS — M79675 Pain in left toe(s): Secondary | ICD-10-CM | POA: Diagnosis not present

## 2019-03-22 DIAGNOSIS — D689 Coagulation defect, unspecified: Secondary | ICD-10-CM | POA: Insufficient documentation

## 2019-03-22 DIAGNOSIS — B351 Tinea unguium: Secondary | ICD-10-CM | POA: Diagnosis not present

## 2019-03-22 DIAGNOSIS — E1142 Type 2 diabetes mellitus with diabetic polyneuropathy: Secondary | ICD-10-CM

## 2019-03-22 DIAGNOSIS — M21611 Bunion of right foot: Secondary | ICD-10-CM

## 2019-03-22 DIAGNOSIS — M2042 Other hammer toe(s) (acquired), left foot: Secondary | ICD-10-CM | POA: Insufficient documentation

## 2019-03-22 NOTE — Progress Notes (Signed)
Complaint:  Visit Type: Patient returns to my office for continued preventative foot care services. Complaint: Patient states" my nails have grown long and thick and become painful to walk and wear shoes" Patient has been diagnosed with DM with no foot complications. The patient presents for preventative foot care services.  Patient is diabetic with history CVA and DM.    Podiatric Exam: Vascular: dorsalis pedis and posterior tibial pulses are palpable bilateral. Capillary return is immediate. Temperature gradient is WNL. Skin turgor WNL  Sensorium: Normal Semmes Weinstein monofilament test. Normal tactile sensation bilaterally. Nail Exam: Pt has thick disfigured discolored nails with subungual debris noted bilateral entire nail hallux through fifth toenails.  Double nail growing right great toe secondary to previous surgery. Ulcer Exam: There is no evidence of ulcer or pre-ulcerative changes or infection. Orthopedic Exam: Muscle tone and strength are WNL. No limitations in general ROM. No crepitus or effusions noted. Foot type and digits show no abnormalities. HAV  B/L  HT 5th  B/L. Skin: No Porokeratosis. No infection or ulcers.  Pinch callus  B/L.  Diagnosis:  Onychomycosis, , Pain in right toe, pain in left toes  Treatment & Plan Procedures and Treatment: Consent by patient was obtained for treatment procedures.   Debridement of mycotic and hypertrophic toenails, 1 through 5 bilateral and clearing of subungual debris. No ulceration, no infection noted.  Patient desires surgical consult for evaluation. Return Visit-Office Procedure: Patient instructed to return to the office for a follow up visit 3 months for continued evaluation and treatment.    Gardiner Barefoot DPM

## 2019-03-24 LAB — HEPATITIS C RNA QUANTITATIVE
HCV Quantitative Log: <1.18 Log IU/mL
HCV RNA, PCR, QN: <15 IU/mL

## 2019-03-28 ENCOUNTER — Encounter (HOSPITAL_COMMUNITY): Payer: Self-pay | Admitting: Internal Medicine

## 2019-03-28 ENCOUNTER — Ambulatory Visit (HOSPITAL_COMMUNITY)
Admission: RE | Admit: 2019-03-28 | Discharge: 2019-03-28 | Disposition: A | Discharge status: Home / Self Care | Payer: Medicaid Other | Source: Ambulatory Visit | Attending: Internal Medicine | Admitting: Internal Medicine

## 2019-03-28 ENCOUNTER — Other Ambulatory Visit: Payer: Self-pay

## 2019-03-28 VITALS — BP 134/76 | HR 76 | Wt 198.6 lb

## 2019-03-28 DIAGNOSIS — Z720 Tobacco use: Secondary | ICD-10-CM | POA: Diagnosis not present

## 2019-03-28 DIAGNOSIS — I5022 Chronic systolic (congestive) heart failure: Secondary | ICD-10-CM

## 2019-03-28 DIAGNOSIS — I10 Essential (primary) hypertension: Secondary | ICD-10-CM | POA: Diagnosis not present

## 2019-03-28 DIAGNOSIS — I429 Cardiomyopathy, unspecified: Secondary | ICD-10-CM | POA: Diagnosis not present

## 2019-03-28 LAB — BASIC METABOLIC PANEL
Anion gap: 10 (ref 5–15)
BUN: 7 mg/dL (ref 6–20)
CO2: 23 mmol/L (ref 22–32)
Calcium: 9.2 mg/dL (ref 8.9–10.3)
Chloride: 105 mmol/L (ref 98–111)
Creatinine, Ser: 0.77 mg/dL (ref 0.61–1.24)
GFR calc Af Amer: >60 mL/min (ref >60)
GFR calc non Af Amer: >60 mL/min (ref >60)
Glucose, Bld: 119 mg/dL — ABNORMAL HIGH (ref 70–99)
Potassium: 3.4 mmol/L — ABNORMAL LOW (ref 3.5–5.1)
Sodium: 138 mmol/L (ref 135–145)

## 2019-03-28 LAB — BRAIN NATRIURETIC PEPTIDE: B Natriuretic Peptide: 21.2 pg/mL (ref 0.0–100.0)

## 2019-03-28 MED ORDER — CARVEDILOL 6.25 MG PO TABS: 6.2500 mg | ORAL_TABLET | Freq: Two times a day (BID) | ORAL | 6 refills | Status: DC

## 2019-03-28 MED FILL — CARVEDILOL 6.25 MG TABLET: 6.25 | 90 days supply | Qty: 180 | Fill #0

## 2019-03-28 NOTE — Progress Notes (Signed)
ADVANCED HF CLINIC NOTE  Referring Physician: Dr. Joya Gaskins HF Cardiologist: DB  HPI:  58 y/o male with multiple medical problems including substance abuse (tobacco, cocaine), HCV (s/p treatment), HTN, HL, DM2, previous TIA and  h/o systolic HF dating back to 2011. Echo 02/2010 with EF 50-55%  Admitted to [facility] 9/19 with acute HF. EF 25-30%. Had cath. No CAD.   Admitted to [facility] 12/19 with recurrent HF and HTN. Saw Dr. Joya Gaskins as outpatient and echo done 07/03/18 EF 25-30%   We last saw him in March 2020. At that point we started Bidil. Has followed with PharmD. Now on 2 tabs tid.   Feels very good. Exercising doing push-ups and walking. Can walk a couple miles without a problem. No CP/SOB. Edema, orthopnea or PND. No problems with meds. No ETOH. Smokes a couple Banker.   Had angioedema with Benicar so not on ACE/ARNI/ARB   Past Medical History:  Diagnosis Date  . Diabetes mellitus without complication ([place])   . Heart failure ([place])   . Hepatitis C   . History of CVA (cerebrovascular accident) 02/2010   Left periventricular subcortical ischemic infarction // Residual RUE and RLE weakness  . History of depression    surrounding original stroke - has since resolved (04/2012)  . Hyperlipidemia   . Hypertension   . Internal carotid artery stenosis    BL and mild per CT angiogram (05/2010)  . Stroke ([place]) 2012  . Tobacco abuse     Current Outpatient Medications  Medication Sig Dispense Refill  . Accu-Chek FastClix Lancets MISC Use as instructed. Inject into the skin once daily. E11.9 100 each 3  . amLODipine (NORVASC) 10 MG tablet Take 1 tablet (10 mg total) by mouth daily. 90 tablet 2  . aspirin 81 MG tablet Take 1 tablet (81 mg total) by mouth daily. 30 tablet 5  . atorvastatin (LIPITOR) 80 MG tablet Take 1 tablet (80 mg total) by mouth daily. 90 tablet 2  . Blood Glucose Calibration (ACCU-CHEK GUIDE CONTROL) LIQD 1 each by In Vitro route once as needed for up to 1 dose.  E11.9 1 each 0  . Blood Pressure Monitor DEVI Please provide patient with insurance approved blood pressure monitor 1 Device 0  . carvedilol (COREG) 3.125 MG tablet Take 1 tablet (3.125 mg total) by mouth 2 (two) times daily with a meal. 60 tablet 2  . clopidogrel (PLAVIX) 75 MG tablet Take 1 tablet (75 mg total) by mouth daily. 30 tablet 6  . glucose blood (ACCU-CHEK GUIDE) test strip Use as instructed. Check blood glucose by fingerstick once per day.  E11.9 100 each 12  . isosorbide-hydrALAZINE (BIDIL) 20-37.5 MG tablet Take 2 tablets by mouth 3 (three) times daily. 180 tablet 6  . metFORMIN (GLUCOPHAGE) 500 MG tablet Take 1 tablet (500 mg total) by mouth 2 (two) times daily with a meal. 180 tablet 1  . naproxen (NAPROSYN) 375 MG tablet Take 1 tablet (375 mg total) by mouth 2 (two) times daily as needed for moderate pain. 60 tablet 2  . spironolactone (ALDACTONE) 25 MG tablet Take 1 tablet (25 mg total) by mouth daily. 30 tablet 2   No current facility-administered medications for this visit.     Allergies  Allergen Reactions  . Benicar [Olmesartan] Anaphylaxis and Swelling    Patient cannot name the site of swelling (??)  . Ace Inhibitors Swelling    Patient cannot name the site of swelling (??)  . Angiotensin Receptor Blockers Swelling  Patient cannot name the site of swelling (??)      Social History   Socioeconomic History  . Marital status: Married    Spouse name: Not on file  . Number of children: 1  . Years of education: 11th grade  . Highest education level: Not on file  Occupational History  . Occupation: On disability    Comment: previously worked for [employer] until his stroke in 2011.   Social Needs  . Financial resource strain: Not on file  . Food insecurity    Worry: Not on file    Inability: Not on file  . Transportation needs    Medical: Not on file    Non-medical: Not on file  Tobacco Use  . Smoking status: Current Some Day Smoker    Packs/day:  0.30    Years: 31.00    Pack years: 9.30    Types: Cigarettes    Start date: 04/25/1981  . Smokeless tobacco: Never Used  . Tobacco comment: up to 3 cigs/day; cutting back.  Substance and Sexual Activity  . Alcohol use: Not Currently    Comment: occasional  . Drug use: No    Comment: Former smoke crack   . Sexual activity: Yes  Lifestyle  . Physical activity    Days per week: Not on file    Minutes per session: Not on file  . Stress: Not on file  Relationships  . Social Herbalist on phone: Not on file    Gets together: Not on file    Attends religious service: Not on file    Active member of club or organization: Not on file    Attends meetings of clubs or organizations: Not on file    Relationship status: Not on file  . Intimate partner violence    Fear of current or ex partner: Not on file    Emotionally abused: Not on file    Physically abused: Not on file    Forced sexual activity: Not on file  Other Topics Concern  . Not on file  Social History Narrative   Lives in [place] with wife.   Has been incarcerated multiple times, last time was in 2012 for 8 months.      Family History  Problem Relation Age of Onset  . Diabetes Mother   . Heart failure Mother   . Hypertension Mother   . Hyperlipidemia Mother   . Glaucoma Mother   . Lung cancer Father   . Hypertension Brother   . Diabetes Brother   . Pancreatic cancer Brother   . Diabetes Sister   . Arthritis Sister     Vitals:   03/28/19 1225  BP: 134/76  Pulse: 76  SpO2: 100%  Weight: 90.1 kg (198 lb 9.6 oz)    PHYSICAL EXAM: General:  Well appearing. No resp difficulty HEENT: normal Neck: supple. no JVD. Carotids 2+ bilat; no bruits. No lymphadenopathy or thryomegaly appreciated. Cor: PMI nondisplaced. Regular rate & rhythm. No rubs, gallops or murmurs. Lungs: clear Abdomen: soft, nontender, nondistended. No hepatosplenomegaly. No bruits or masses. Good bowel sounds. Extremities: no  cyanosis, clubbing, rash, edema Neuro: alert & orientedx3, cranial nerves grossly intact. moves all 4 extremities w/o difficulty. Affect pleasant   ECG: NSR 97. RBBB LVH. (05/05/18) - Personally reviewed   ASSESSMENT & PLAN:  1. Chronic systolic HF due to NICM - Echo 9/19 EF 25-30%, Echo 2/20 EF 25-30% - Cath 9/19 [facility]. No CAD - Suspect due to  HTN and substance use - Improved. NYHA I - Volume status looks good.  - Increase carvedilol to 6.25 bid - Continue spiro 25 daily. Labs today - No ACE/ARB/ARNI with angioedema to Benicar in past - Continue Bidil 2 tabs tid - Denies further use of ETOH and substance abuse - No ICD at this point with NYHA I - Repeat echo.  - IF EF still down will add SGLT2i   2. HTN - Improved control. Meds as above.   3. Substance abuse - reports cessation  4. CVA - Continue DAPT and statin  - Minimal residual deficit   5. HCV - treated. Recent VL undetectable   6. Tobacco use - encouraged to quit completely  Glori Bickers, MD  11:45 AM

## 2019-03-28 NOTE — Patient Instructions (Signed)
INCREASE Carvedilol (Coreg) to 6.25mg  (1 tab) twice a day  Labs today We will only contact you if something comes back abnormal or we need to make some changes. Otherwise no news is good news!  Your physician has requested that you have an echocardiogram. Echocardiography is a painless test that uses sound waves to create images of your heart. It provides your doctor with information about the size and shape of your heart and how well your heart's chambers and valves are working. This procedure takes approximately one hour. There are no restrictions for this procedure.  Your physician recommends that you schedule a follow-up appointment in: 3 months with Dr Haroldine Laws  At the [facility], you and your health needs are our priority. As part of our continuing mission to provide you with exceptional heart care, we have created designated Provider Care Teams. These Care Teams include your primary Cardiologist (physician) and Advanced Practice Providers (APPs- Physician Assistants and Nurse Practitioners) who all work together to provide you with the care you need, when you need it.   You may see any of the following providers on your designated Care Team at your next follow up: Marland Kitchen Dr Glori Bickers . Dr Loralie Champagne . Darrick Grinder, NP . Lyda Jester, PA   Please be sure to bring in all your medications bottles to every appointment.

## 2019-03-29 ENCOUNTER — Telehealth (HOSPITAL_COMMUNITY): Payer: Self-pay | Admitting: *Deleted

## 2019-03-29 MED ORDER — POTASSIUM CHLORIDE CRYS ER 20 MEQ PO TBCR: 20.0000 meq | EXTENDED_RELEASE_TABLET | Freq: Every day | ORAL | 6 refills | Status: DC

## 2019-03-29 NOTE — Telephone Encounter (Signed)
Notes recorded by Scarlette Calico, RN on 03/29/2019 at 2:22 PM EST  Spoke w/pt, he is aware, agreeable and verbalizes understanding, rx for kcl sent in

## 2019-03-29 NOTE — Telephone Encounter (Signed)
-----   Message from Jolaine Artist, MD sent at 03/28/2019  2:38 PM EST ----- Add kcl 20 daily. Take 60 meq on the first day.

## 2019-04-03 DIAGNOSIS — Z5181 Encounter for therapeutic drug level monitoring: Secondary | ICD-10-CM | POA: Diagnosis not present

## 2019-04-10 DIAGNOSIS — Z5181 Encounter for therapeutic drug level monitoring: Secondary | ICD-10-CM | POA: Diagnosis not present

## 2019-04-11 ENCOUNTER — Telehealth (HOSPITAL_COMMUNITY): Payer: Self-pay | Admitting: Licensed Clinical Social Worker

## 2019-04-11 ENCOUNTER — Ambulatory Visit (HOSPITAL_COMMUNITY): Payer: Medicaid Other

## 2019-04-11 NOTE — Telephone Encounter (Signed)
CSW consulted to reach out to pt regarding current housing concerns.  Pt staying in a trailer behind a friends house and the trailer has no electricity or access to running water.  CSW had met with pt regarding this in October and provided with list of housing options/resources for him to look into.  Pt states he is actively looking into possible options at this time and states he will reach out to CSW for assistance if needed.  CSW will continue to follow and assist as needed  Jorge Ny, [facility] Desk#: [phone] Cell#: [phone]

## 2019-04-15 ENCOUNTER — Ambulatory Visit: Payer: Medicaid Other | Admitting: Podiatry

## 2019-04-17 DIAGNOSIS — Z5181 Encounter for therapeutic drug level monitoring: Secondary | ICD-10-CM | POA: Diagnosis not present

## 2019-04-18 ENCOUNTER — Ambulatory Visit (HOSPITAL_COMMUNITY)
Admission: RE | Admit: 2019-04-18 | Discharge: 2019-04-18 | Disposition: A | Discharge status: Home / Self Care | Payer: Medicaid Other | Source: Ambulatory Visit | Attending: Internal Medicine | Admitting: Internal Medicine

## 2019-04-18 ENCOUNTER — Other Ambulatory Visit: Payer: Self-pay

## 2019-04-18 DIAGNOSIS — I509 Heart failure, unspecified: Secondary | ICD-10-CM | POA: Diagnosis not present

## 2019-04-18 DIAGNOSIS — F17200 Nicotine dependence, unspecified, uncomplicated: Secondary | ICD-10-CM | POA: Insufficient documentation

## 2019-04-18 DIAGNOSIS — I110 Hypertensive heart disease with heart failure: Secondary | ICD-10-CM | POA: Insufficient documentation

## 2019-04-18 DIAGNOSIS — Z8673 Personal history of transient ischemic attack (TIA), and cerebral infarction without residual deficits: Secondary | ICD-10-CM | POA: Insufficient documentation

## 2019-04-18 DIAGNOSIS — E785 Hyperlipidemia, unspecified: Secondary | ICD-10-CM | POA: Insufficient documentation

## 2019-04-18 DIAGNOSIS — I358 Other nonrheumatic aortic valve disorders: Secondary | ICD-10-CM | POA: Insufficient documentation

## 2019-04-18 DIAGNOSIS — E119 Type 2 diabetes mellitus without complications: Secondary | ICD-10-CM | POA: Insufficient documentation

## 2019-04-18 DIAGNOSIS — I429 Cardiomyopathy, unspecified: Secondary | ICD-10-CM | POA: Diagnosis not present

## 2019-04-18 NOTE — Progress Notes (Signed)
  Echocardiogram 2D Echocardiogram has been performed.  Ronald Marsh 04/18/2019, 3:29 PM

## 2019-04-22 ENCOUNTER — Encounter: Payer: Self-pay | Admitting: Podiatry

## 2019-04-22 ENCOUNTER — Ambulatory Visit: Payer: Medicaid Other | Admitting: Podiatry

## 2019-04-22 ENCOUNTER — Other Ambulatory Visit: Payer: Self-pay

## 2019-04-22 DIAGNOSIS — M2041 Other hammer toe(s) (acquired), right foot: Secondary | ICD-10-CM

## 2019-04-22 DIAGNOSIS — L600 Ingrowing nail: Secondary | ICD-10-CM

## 2019-04-22 DIAGNOSIS — M2042 Other hammer toe(s) (acquired), left foot: Secondary | ICD-10-CM | POA: Diagnosis not present

## 2019-04-22 MED ORDER — TRAMADOL HCL 50 MG PO TABS: 50.0000 mg | ORAL_TABLET | Freq: Three times a day (TID) | ORAL | 2 refills | Status: DC

## 2019-04-22 MED ORDER — NEOMYCIN-POLYMYXIN-HC 3.5-10000-1 OT SOLN: OTIC | 0 refills | Status: DC

## 2019-04-22 NOTE — Patient Instructions (Signed)

## 2019-04-22 NOTE — Progress Notes (Signed)
Subjective:   Patient ID: Ronald Marsh, male   DOB: 58 y.o.   [name]: [id number]   HPI Patient presents stating he wants to have this hallux nail removed as it is become increasingly sore and he is tried to take care of it himself but he no longer can trim it and trimming it here is no longer effective for that particular nail.  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  I explained procedure risk and I explained healing and he wants surgery and today I infiltrated the right hallux 60 mg like Marcaine mixture sterile prep applied to the toe and using sterile instrumentation I removed the 2 pieces of the damaged hallux nail exposed matrix and applied phenol followed by alcohol for applications 30 seconds.  Applied sterile dressing gave instructions on soaks leaving dressing on 24 hours and taken off early if throbbing were to occur and also wrote a prescription for tramadol and drops to use postoperatively.  Encouraged him to call with any questions concerns he may have during healing but this should heal uneventfully

## 2019-04-23 DIAGNOSIS — Z5181 Encounter for therapeutic drug level monitoring: Secondary | ICD-10-CM | POA: Diagnosis not present

## 2019-05-14 DIAGNOSIS — Z5181 Encounter for therapeutic drug level monitoring: Secondary | ICD-10-CM | POA: Diagnosis not present

## 2019-05-16 ENCOUNTER — Other Ambulatory Visit (HOSPITAL_COMMUNITY): Payer: Self-pay | Admitting: Cardiology

## 2019-05-21 ENCOUNTER — Other Ambulatory Visit (HOSPITAL_COMMUNITY): Payer: Self-pay | Admitting: Cardiology

## 2019-05-21 DIAGNOSIS — Z5181 Encounter for therapeutic drug level monitoring: Secondary | ICD-10-CM | POA: Diagnosis not present

## 2019-05-22 ENCOUNTER — Other Ambulatory Visit (HOSPITAL_COMMUNITY): Payer: Self-pay

## 2019-05-24 ENCOUNTER — Other Ambulatory Visit (HOSPITAL_COMMUNITY): Payer: Self-pay

## 2019-05-24 MED ORDER — POTASSIUM CHLORIDE CRYS ER 20 MEQ PO TBCR: 20.0000 meq | EXTENDED_RELEASE_TABLET | Freq: Every day | ORAL | 6 refills | Status: DC

## 2019-05-28 DIAGNOSIS — Z5181 Encounter for therapeutic drug level monitoring: Secondary | ICD-10-CM | POA: Diagnosis not present

## 2019-05-31 ENCOUNTER — Ambulatory Visit: Payer: Medicaid Other | Admitting: Podiatry

## 2019-06-11 DIAGNOSIS — Z5181 Encounter for therapeutic drug level monitoring: Secondary | ICD-10-CM | POA: Diagnosis not present

## 2019-06-18 DIAGNOSIS — Z5181 Encounter for therapeutic drug level monitoring: Secondary | ICD-10-CM | POA: Diagnosis not present

## 2019-06-21 ENCOUNTER — Ambulatory Visit: Payer: Medicaid Other | Admitting: Podiatry

## 2019-06-24 DIAGNOSIS — Z5181 Encounter for therapeutic drug level monitoring: Secondary | ICD-10-CM | POA: Diagnosis not present

## 2019-07-02 ENCOUNTER — Ambulatory Visit: Payer: Medicaid Other | Admitting: Podiatry

## 2019-07-02 ENCOUNTER — Telehealth (HOSPITAL_COMMUNITY): Payer: Self-pay

## 2019-07-02 NOTE — Telephone Encounter (Signed)

## 2019-07-03 ENCOUNTER — Ambulatory Visit (HOSPITAL_COMMUNITY)
Admission: RE | Admit: 2019-07-03 | Discharge: 2019-07-03 | Disposition: A | Discharge status: Home / Self Care | Payer: Medicaid Other | Source: Ambulatory Visit | Attending: Internal Medicine | Admitting: Internal Medicine

## 2019-07-03 ENCOUNTER — Other Ambulatory Visit: Payer: Self-pay

## 2019-07-03 ENCOUNTER — Encounter (HOSPITAL_COMMUNITY): Payer: Self-pay | Admitting: Internal Medicine

## 2019-07-03 VITALS — BP 128/76 | HR 75 | Wt 198.4 lb

## 2019-07-03 DIAGNOSIS — Z79899 Other long term (current) drug therapy: Secondary | ICD-10-CM | POA: Diagnosis not present

## 2019-07-03 DIAGNOSIS — I428 Other cardiomyopathies: Secondary | ICD-10-CM | POA: Insufficient documentation

## 2019-07-03 DIAGNOSIS — I110 Hypertensive heart disease with heart failure: Secondary | ICD-10-CM | POA: Insufficient documentation

## 2019-07-03 DIAGNOSIS — E119 Type 2 diabetes mellitus without complications: Secondary | ICD-10-CM | POA: Insufficient documentation

## 2019-07-03 DIAGNOSIS — R42 Dizziness and giddiness: Secondary | ICD-10-CM | POA: Diagnosis present

## 2019-07-03 DIAGNOSIS — Z7982 Long term (current) use of aspirin: Secondary | ICD-10-CM | POA: Diagnosis not present

## 2019-07-03 DIAGNOSIS — Z720 Tobacco use: Secondary | ICD-10-CM

## 2019-07-03 DIAGNOSIS — Z7984 Long term (current) use of oral hypoglycemic drugs: Secondary | ICD-10-CM | POA: Insufficient documentation

## 2019-07-03 DIAGNOSIS — E785 Hyperlipidemia, unspecified: Secondary | ICD-10-CM | POA: Diagnosis not present

## 2019-07-03 DIAGNOSIS — Z7902 Long term (current) use of antithrombotics/antiplatelets: Secondary | ICD-10-CM | POA: Insufficient documentation

## 2019-07-03 DIAGNOSIS — I10 Essential (primary) hypertension: Secondary | ICD-10-CM

## 2019-07-03 DIAGNOSIS — I5022 Chronic systolic (congestive) heart failure: Secondary | ICD-10-CM | POA: Insufficient documentation

## 2019-07-03 DIAGNOSIS — Z7289 Other problems related to lifestyle: Secondary | ICD-10-CM | POA: Insufficient documentation

## 2019-07-03 DIAGNOSIS — Z8249 Family history of ischemic heart disease and other diseases of the circulatory system: Secondary | ICD-10-CM | POA: Insufficient documentation

## 2019-07-03 DIAGNOSIS — J449 Chronic obstructive pulmonary disease, unspecified: Secondary | ICD-10-CM | POA: Diagnosis not present

## 2019-07-03 DIAGNOSIS — Z8673 Personal history of transient ischemic attack (TIA), and cerebral infarction without residual deficits: Secondary | ICD-10-CM | POA: Diagnosis not present

## 2019-07-03 DIAGNOSIS — F17210 Nicotine dependence, cigarettes, uncomplicated: Secondary | ICD-10-CM | POA: Diagnosis not present

## 2019-07-03 LAB — BASIC METABOLIC PANEL
Anion gap: 9 (ref 5–15)
BUN: 10 mg/dL (ref 6–20)
CO2: 24 mmol/L (ref 22–32)
Calcium: 9.5 mg/dL (ref 8.9–10.3)
Chloride: 105 mmol/L (ref 98–111)
Creatinine, Ser: 0.89 mg/dL (ref 0.61–1.24)
GFR calc Af Amer: >60 mL/min (ref >60)
GFR calc non Af Amer: >60 mL/min (ref >60)
Glucose, Bld: 115 mg/dL — ABNORMAL HIGH (ref 70–99)
Potassium: 4 mmol/L (ref 3.5–5.1)
Sodium: 138 mmol/L (ref 135–145)

## 2019-07-03 LAB — CBC
HCT: 39.6 % (ref 39.0–52.0)
Hemoglobin: 13 g/dL (ref 13.0–17.0)
MCH: 31 pg (ref 26.0–34.0)
MCHC: 32.8 g/dL (ref 30.0–36.0)
MCV: 94.3 fL (ref 80.0–100.0)
Platelets: 261 10*3/uL (ref 150–400)
RBC: 4.2 MIL/uL — ABNORMAL LOW (ref 4.22–5.81)
RDW: 13.9 % (ref 11.5–15.5)
WBC: 5.8 10*3/uL (ref 4.0–10.5)
nRBC: 0 % (ref 0.0–0.2)

## 2019-07-03 LAB — BRAIN NATRIURETIC PEPTIDE: B Natriuretic Peptide: 9.3 pg/mL (ref 0.0–100.0)

## 2019-07-03 NOTE — Progress Notes (Signed)
ADVANCED HF CLINIC NOTE  Referring Physician: Dr. Joya Gaskins HF Cardiologist: DB  HPI:  59 y/o male with multiple medical problems including substance abuse (tobacco, cocaine), HCV (s/p treatment), HTN, HL, DM2, previous TIA and  h/o systolic HF dating back to 2011. Echo 02/2010 with EF 50-55%  Admitted to [facility] 9/19 with acute HF. EF 25-30%. Had cath. No CAD.   Admitted to [facility] 12/19 with recurrent HF and HTN. Echo done 07/03/18 EF 25-30%   Echo 12/20 EF 40-45% RV ok Personally reviewe  Here for routine f/u. Feels very good. Exercising doing push-ups and walking. Does about 200 push ups every other day. No CP, sob, orthopnea or PND. Taking all meds. Says he gets dizzy when he stands up. Cut back to 2-3 cigs/day   Had angioedema with Benicar so not on ACE/ARNI/ARB   Past Medical History:  Diagnosis Date  . Diabetes mellitus without complication ([place])   . Heart failure ([place])   . Hepatitis C   . History of CVA (cerebrovascular accident) 02/2010   Left periventricular subcortical ischemic infarction // Residual RUE and RLE weakness  . History of depression    surrounding original stroke - has since resolved (04/2012)  . Hyperlipidemia   . Hypertension   . Internal carotid artery stenosis    BL and mild per CT angiogram (05/2010)  . Stroke ([place]) 2012  . Tobacco abuse     Current Outpatient Medications  Medication Sig Dispense Refill  . Accu-Chek FastClix Lancets MISC Use as instructed. Inject into the skin once daily. E11.9 100 each 3  . amLODipine (NORVASC) 10 MG tablet Take 1 tablet (10 mg total) by mouth daily. 90 tablet 2  . aspirin 81 MG tablet Take 1 tablet (81 mg total) by mouth daily. 30 tablet 5  . atorvastatin (LIPITOR) 80 MG tablet Take 1 tablet (80 mg total) by mouth daily. 90 tablet 2  . Blood Glucose Calibration (ACCU-CHEK GUIDE CONTROL) LIQD 1 each by In Vitro route once as needed for up to 1 dose. E11.9 1 each 0  . Blood Pressure Monitor DEVI Please provide  patient with insurance approved blood pressure monitor 1 Device 0  . carvedilol (COREG) 6.25 MG tablet Take 1 tablet (6.25 mg total) by mouth 2 (two) times daily with a meal. 60 tablet 6  . clopidogrel (PLAVIX) 75 MG tablet Take 1 tablet (75 mg total) by mouth daily. 30 tablet 6  . glucose blood (ACCU-CHEK GUIDE) test strip Use as instructed. Check blood glucose by fingerstick once per day.  E11.9 100 each 12  . isosorbide-hydrALAZINE (BIDIL) 20-37.5 MG tablet Take 2 tablets by mouth 3 (three) times daily. 180 tablet 6  . naproxen (NAPROSYN) 375 MG tablet Take 1 tablet (375 mg total) by mouth 2 (two) times daily as needed for moderate pain. 60 tablet 2  . neomycin-polymyxin-hydrocortisone (CORTISPORIN) OTIC solution Apply 2-3 drops to the ingrown toenail site twice daily. Cover with band-aid. 10 mL 0  . spironolactone (ALDACTONE) 25 MG tablet TAKE 1 TABLET(25 MG) BY MOUTH DAILY 90 tablet 3  . traMADol (ULTRAM) 50 MG tablet Take 1 tablet (50 mg total) by mouth 3 (three) times daily. 15 tablet 2  . metFORMIN (GLUCOPHAGE) 500 MG tablet Take 1 tablet (500 mg total) by mouth 2 (two) times daily with a meal. 180 tablet 1   No current facility-administered medications for this encounter.    Allergies  Allergen Reactions  . Benicar [Olmesartan] Anaphylaxis and Swelling    Patient  cannot name the site of swelling (??)  . Ace Inhibitors Swelling    Patient cannot name the site of swelling (??)  . Angiotensin Receptor Blockers Swelling    Patient cannot name the site of swelling (??)      Social History   Socioeconomic History  . Marital status: Married    Spouse name: Not on file  . Number of children: 1  . Years of education: 11th grade  . Highest education level: Not on file  Occupational History  . Occupation: On disability    Comment: previously worked for [employer] until his stroke in 2011.   Tobacco Use  . Smoking status: Current Some Day Smoker    Packs/day: 0.30    Years:  31.00    Pack years: 9.30    Types: Cigarettes    Start date: 04/25/1981  . Smokeless tobacco: Never Used  . Tobacco comment: up to 3 cigs/day; cutting back.  Substance and Sexual Activity  . Alcohol use: Not Currently    Comment: occasional  . Drug use: No    Comment: Former smoke crack   . Sexual activity: Yes  Other Topics Concern  . Not on file  Social History Narrative   Lives in [place] with wife.   Has been incarcerated multiple times, last time was in 2012 for 8 months.   Social Determinants of Health   Financial Resource Strain:   . Difficulty of Paying Living Expenses: Not on file  Food Insecurity:   . Worried About Charity fundraiser in the Last Year: Not on file  . Ran Out of Food in the Last Year: Not on file  Transportation Needs:   . Lack of Transportation (Medical): Not on file  . Lack of Transportation (Non-Medical): Not on file  Physical Activity:   . Days of Exercise per Week: Not on file  . Minutes of Exercise per Session: Not on file  Stress:   . Feeling of Stress : Not on file  Social Connections:   . Frequency of Communication with Friends and Family: Not on file  . Frequency of Social Gatherings with Friends and Family: Not on file  . Attends Religious Services: Not on file  . Active Member of Clubs or Organizations: Not on file  . Attends Archivist Meetings: Not on file  . Marital Status: Not on file  Intimate Partner Violence:   . Fear of Current or Ex-Partner: Not on file  . Emotionally Abused: Not on file  . Physically Abused: Not on file  . Sexually Abused: Not on file      Family History  Problem Relation Age of Onset  . Diabetes Mother   . Heart failure Mother   . Hypertension Mother   . Hyperlipidemia Mother   . Glaucoma Mother   . Lung cancer Father   . Hypertension Brother   . Diabetes Brother   . Pancreatic cancer Brother   . Diabetes Sister   . Arthritis Sister     Vitals:   07/03/19 1344  BP: 128/76    Pulse: 75  SpO2: 99%  Weight: 90 kg (198 lb 6 oz)    PHYSICAL EXAM: General:  Well appearing. No resp difficulty HEENT: normal Neck: supple. no JVD. Carotids 2+ bilat; no bruits. No lymphadenopathy or thryomegaly appreciated. Cor: PMI nondisplaced. Regular rate & rhythm. No rubs, gallops or murmurs. Lungs: clear with decreased BS throughout  Abdomen: soft, nontender, nondistended. No hepatosplenomegaly. No bruits or masses.  Good bowel sounds. Extremities: no cyanosis, clubbing, rash, edema Neuro: alert & orientedx3, cranial nerves grossly intact. moves all 4 extremities w/o difficulty. Affect pleasant   ECG: NSR 97. RBBB LVH. (05/05/18) - Personally reviewed   ASSESSMENT & PLAN:  1. Chronic systolic HF due to NICM - Echo 9/19 EF 25-30%, Echo 2/20 EF 25-30 - Cath 9/19 [facility]. No CAD - Echo 12/20 EF improved to 40-45% - Suspect due to HTN and substance use - Improved. NYHA I - Volume status looks good.  - Continue carvedilol to 6.25 bid - Continue spiro 25 daily. - No ACE/ARB/ARNI with angioedema to Benicar in past - Continue Bidil 2 tabs tid - Denies further use of ETOH and substance abuse - No ICD at this point with NYHA I  2. HTN - Blood pressure well controlled. Continue current regimen. - Can drop amlopidine as needed  3. Substance abuse - reports cessation  4. CVA - Continue DAPT and statin  - Minimal residual deficit   5. HCV - treated. Recent VL undetectable   6. Tobacco use - encouraged to quit completely - has evidence of COPD on exam   Glori Bickers, MD  2:27 PM

## 2019-07-03 NOTE — Patient Instructions (Signed)
Labs done today, we will notify you of abnormal results  Please call our office in July to schedule your 6 month follow up  If you have any questions or concerns before your next appointment please send [place] a message through [place] or call our office at [phone].  At the [facility], you and your health needs are our priority. As part of our continuing mission to provide you with exceptional heart care, we have created designated Provider Care Teams. These Care Teams include your primary Cardiologist (physician) and Advanced Practice Providers (APPs- Physician Assistants and Nurse Practitioners) who all work together to provide you with the care you need, when you need it.   You may see any of the following providers on your designated Care Team at your next follow up: Marland Kitchen Dr Glori Bickers . Dr Loralie Champagne . Darrick Grinder, NP . Lyda Jester, PA . Audry Riles, PharmD   Please be sure to bring in all your medications bottles to every appointment.

## 2019-07-04 DIAGNOSIS — Z5181 Encounter for therapeutic drug level monitoring: Secondary | ICD-10-CM | POA: Diagnosis not present

## 2019-07-16 DIAGNOSIS — Z5181 Encounter for therapeutic drug level monitoring: Secondary | ICD-10-CM | POA: Diagnosis not present

## 2019-07-23 DIAGNOSIS — Z5181 Encounter for therapeutic drug level monitoring: Secondary | ICD-10-CM | POA: Diagnosis not present

## 2019-07-27 ENCOUNTER — Other Ambulatory Visit (HOSPITAL_COMMUNITY): Payer: Self-pay | Admitting: Cardiology

## 2019-07-27 DIAGNOSIS — I10 Essential (primary) hypertension: Secondary | ICD-10-CM

## 2019-07-30 NOTE — Telephone Encounter (Signed)
This is a CHF pt 

## 2019-08-06 DIAGNOSIS — Z5181 Encounter for therapeutic drug level monitoring: Secondary | ICD-10-CM | POA: Diagnosis not present

## 2019-08-13 DIAGNOSIS — Z5181 Encounter for therapeutic drug level monitoring: Secondary | ICD-10-CM | POA: Diagnosis not present

## 2019-08-17 ENCOUNTER — Other Ambulatory Visit: Payer: Self-pay

## 2019-08-17 ENCOUNTER — Emergency Department (HOSPITAL_COMMUNITY)
Admission: EM | Admit: 2019-08-17 | Discharge: 2019-08-17 | Disposition: A | Discharge status: Home / Self Care | Payer: Medicaid Other | Attending: Emergency Medicine | Admitting: Emergency Medicine

## 2019-08-17 ENCOUNTER — Encounter (HOSPITAL_COMMUNITY): Payer: Self-pay | Admitting: Emergency Medicine

## 2019-08-17 DIAGNOSIS — F17210 Nicotine dependence, cigarettes, uncomplicated: Secondary | ICD-10-CM | POA: Diagnosis not present

## 2019-08-17 DIAGNOSIS — I110 Hypertensive heart disease with heart failure: Secondary | ICD-10-CM | POA: Diagnosis not present

## 2019-08-17 DIAGNOSIS — Z79899 Other long term (current) drug therapy: Secondary | ICD-10-CM | POA: Insufficient documentation

## 2019-08-17 DIAGNOSIS — H9193 Unspecified hearing loss, bilateral: Secondary | ICD-10-CM | POA: Diagnosis present

## 2019-08-17 DIAGNOSIS — E119 Type 2 diabetes mellitus without complications: Secondary | ICD-10-CM | POA: Diagnosis not present

## 2019-08-17 DIAGNOSIS — Z7902 Long term (current) use of antithrombotics/antiplatelets: Secondary | ICD-10-CM | POA: Insufficient documentation

## 2019-08-17 DIAGNOSIS — Z7982 Long term (current) use of aspirin: Secondary | ICD-10-CM | POA: Diagnosis not present

## 2019-08-17 DIAGNOSIS — I5032 Chronic diastolic (congestive) heart failure: Secondary | ICD-10-CM | POA: Diagnosis not present

## 2019-08-17 DIAGNOSIS — H6123 Impacted cerumen, bilateral: Secondary | ICD-10-CM | POA: Insufficient documentation

## 2019-08-17 MED ORDER — CARBAMIDE PEROXIDE 6.5 % OT SOLN
5.0000 [drp] | Freq: Once | OTIC | Status: AC
  Administered 2019-08-17: 11:00:00 5 [drp] via OTIC
  Filled 2019-08-17: qty 15

## 2019-08-17 MED ORDER — CIPROFLOXACIN-DEXAMETHASONE 0.3-0.1 % OT SUSP: 4.0000 [drp] | Freq: Two times a day (BID) | OTIC | 0 refills | Status: DC

## 2019-08-17 MED ORDER — CIPROFLOXACIN-DEXAMETHASONE 0.3-0.1 % OT SUSP
4.0000 [drp] | Freq: Once | OTIC | Status: AC
  Administered 2019-08-17: 4 [drp] via OTIC
  Filled 2019-08-17: qty 7.5

## 2019-08-17 NOTE — ED Triage Notes (Signed)
Reports R ear pain and decreased hearing upon waking this am. States he stuck a q tip in his ear a few times and noticed some blood. Denies any change in pain after using q tip. Denies any other associated symptoms.

## 2019-08-17 NOTE — Discharge Instructions (Signed)
Place the drops to the right ear 4 times daily.  Follow-up with ear nose and throat physicians if you continue to have symptoms.  Return to the emergency department for any new symptoms

## 2019-08-17 NOTE — ED Provider Notes (Signed)
[facility] Provider Note   CSN: [id number] Arrival date & time: 08/17/19  1020     History Chief Complaint  Patient presents with  . Otalgia    Ronald Marsh is a 59 y.o. male with past medical history significant for diabetes, heart failure, CVA, tobacco use who presents for evaluation of decreased hearing upon waking up this morning.  Patient denies any pain.  Denies any pulsatile tinnitus.  No lightheadedness, dizziness, headache, neck pain, neck stiffness.  Dates he did stick a Q-tip in his right ear and thought he noticed some dark blood after using the Q-tip.  Denies fever, chills, nausea, vomiting, congestion, rhinorrhea, chest pain, shortness of breath, domino pain, diarrhea, dysuria.  Denies additional aggravating or alleviating factors.  Admits to his sensation of bilateral ear fullness R>L  History obtained from patient and past medical records.  No interpreter is used.  HPI     Past Medical History:  Diagnosis Date  . Diabetes mellitus without complication ([place])   . Heart failure ([place])   . Hepatitis C   . History of CVA (cerebrovascular accident) 02/2010   Left periventricular subcortical ischemic infarction // Residual RUE and RLE weakness  . History of depression    surrounding original stroke - has since resolved (04/2012)  . Hyperlipidemia   . Hypertension   . Internal carotid artery stenosis    BL and mild per CT angiogram (05/2010)  . Stroke ([place]) 2012  . Tobacco abuse     Patient Active Problem List   Diagnosis Date Noted  . Hammer toes of both feet 03/22/2019  . Coagulation disorder ([place]) 03/22/2019  . Pain due to onychomycosis of toenails of both feet 12/11/2018  . Chronic hepatitis C without hepatic coma ([place]) 06/13/2018  . Toenail fungus 06/12/2018  . Bilateral bunions 06/12/2018  . Acute on chronic diastolic CHF (congestive heart failure) ([place]) 05/06/2018  . Cardiomyopathy ([place]) 01/31/2018  . History of  substance abuse ([place]) 02/19/2016  . Lumbar radiculopathy 10/01/2015  . Diabetes mellitus type 2, controlled ([place]) 07/03/2012  . Tobacco abuse   . Hypertension   . Hyperlipidemia   . History of CVA (cerebrovascular accident) 02/06/2010    History reviewed. No pertinent surgical history.     Family History  Problem Relation Age of Onset  . Diabetes Mother   . Heart failure Mother   . Hypertension Mother   . Hyperlipidemia Mother   . Glaucoma Mother   . Lung cancer Father   . Hypertension Brother   . Diabetes Brother   . Pancreatic cancer Brother   . Diabetes Sister   . Arthritis Sister     Social History   Tobacco Use  . Smoking status: Current Some Day Smoker    Packs/day: 0.30    Years: 31.00    Pack years: 9.30    Types: Cigarettes    Start date: 04/25/1981  . Smokeless tobacco: Never Used  . Tobacco comment: up to 3 cigs/day; cutting back.  Substance Use Topics  . Alcohol use: Not Currently    Comment: occasional  . Drug use: No    Comment: Former smoke crack     Home Medications Prior to Admission medications   Medication Sig Start Date End Date Taking? Authorizing Provider  Accu-Chek FastClix Lancets MISC Use as instructed. Inject into the skin once daily. E11.9 03/12/19   [name], NP  amLODipine (NORVASC) 10 MG tablet TAKE 1 TABLET(10 MG) BY MOUTH DAILY 07/30/19  Lyda Jester M, PA-C  aspirin 81 MG tablet Take 1 tablet (81 mg total) by mouth daily. 06/12/18   Elsie Stain, MD  atorvastatin (LIPITOR) 80 MG tablet Take 1 tablet (80 mg total) by mouth daily. 03/12/19 07/02/20  [name], NP  Blood Glucose Calibration (ACCU-CHEK GUIDE CONTROL) LIQD 1 each by In Vitro route once as needed for up to 1 dose. E11.9 10/05/18   [name], NP  Blood Pressure Monitor DEVI Please provide patient with insurance approved blood pressure monitor 10/14/18   [name], NP  carvedilol (COREG) 6.25 MG tablet Take 1 tablet (6.25 mg total) by  mouth 2 (two) times daily with a meal. 03/28/19   Bensimhon, Shaune Pascal, MD  ciprofloxacin-dexamethasone (CIPRODEX) OTIC suspension Place 4 drops into the right ear 2 (two) times daily. 08/17/19   Harlym Gehling A, PA-C  clopidogrel (PLAVIX) 75 MG tablet Take 1 tablet (75 mg total) by mouth daily. 02/14/19   Lyda Jester M, PA-C  glucose blood (ACCU-CHEK GUIDE) test strip Use as instructed. Check blood glucose by fingerstick once per day.  E11.9 03/12/19   [name], NP  isosorbide-hydrALAZINE (BIDIL) 20-37.5 MG tablet Take 2 tablets by mouth 3 (three) times daily. 03/20/19   Bensimhon, Shaune Pascal, MD  metFORMIN (GLUCOPHAGE) 500 MG tablet Take 1 tablet (500 mg total) by mouth 2 (two) times daily with a meal. 03/12/19 06/10/19  [name], NP  naproxen (NAPROSYN) 375 MG tablet Take 1 tablet (375 mg total) by mouth 2 (two) times daily as needed for moderate pain. 01/23/19   [name], NP  neomycin-polymyxin-hydrocortisone (CORTISPORIN) OTIC solution Apply 2-3 drops to the ingrown toenail site twice daily. Cover with band-aid. 04/22/19   [name], DPM  spironolactone (ALDACTONE) 25 MG tablet TAKE 1 TABLET(25 MG) BY MOUTH DAILY 05/22/19   Bensimhon, Shaune Pascal, MD  traMADol (ULTRAM) 50 MG tablet Take 1 tablet (50 mg total) by mouth 3 (three) times daily. 04/22/19   [name], DPM    Allergies    Benicar [olmesartan], Ace inhibitors, and Angiotensin receptor blockers  Review of Systems   Review of Systems  Constitutional: Negative.   HENT: Positive for hearing loss (hearing muffled). Negative for congestion, dental problem, drooling, ear discharge, ear pain, facial swelling, nosebleeds, postnasal drip, rhinorrhea, sinus pressure, sinus pain, sneezing, sore throat, trouble swallowing and voice change.        Ear fullness  Eyes: Negative.   Respiratory: Negative.   Cardiovascular: Negative.   Gastrointestinal: Negative.   Genitourinary: Negative.   Musculoskeletal:  Negative.   Skin: Negative.   Neurological: Negative.   All other systems reviewed and are negative.   Physical Exam Updated Vital Signs BP (!) 151/84 (BP Location: Left Arm)   Pulse 73   Temp 98 F (36.7 C) (Oral)   Resp 14   Ht 6' 2.5" (1.892 m)   Wt 95.3 kg   SpO2 100%   BMI 26.60 kg/m   Physical Exam Vitals and nursing note reviewed.  Constitutional:      General: He is not in acute distress.    Appearance: He is well-developed. He is not ill-appearing, toxic-appearing or diaphoretic.  HENT:     Head: Normocephalic and atraumatic.     Ears:     Comments: Left TM obstructed by cerumen.  No evidence of bilateral canal edema.  Patient does have partially obstructed TM on right.  There does appear to be some erythema  without gross blood.  No tenderness with retraction of pain or palpation of tragus.  No tenderness over bilateral mastoids.    Nose: Nose normal.     Mouth/Throat:     Mouth: Mucous membranes are moist.     Pharynx: Oropharynx is clear.  Eyes:     Pupils: Pupils are equal, round, and reactive to light.  Neck:     Comments: No neck stiffness or neck rigidity.  No tenderness Cardiovascular:     Rate and Rhythm: Normal rate and regular rhythm.     Pulses: Normal pulses.     Heart sounds: Normal heart sounds.  Pulmonary:     Effort: Pulmonary effort is normal. No respiratory distress.     Breath sounds: Normal breath sounds.  Abdominal:     General: Bowel sounds are normal. There is no distension.     Palpations: Abdomen is soft.  Musculoskeletal:        General: Normal range of motion.     Cervical back: Normal range of motion and neck supple.  Skin:    General: Skin is warm and dry.     Capillary Refill: Capillary refill takes less than 2 seconds.  Neurological:     Mental Status: He is alert.     ED Results / Procedures / Treatments   Labs (all labs ordered are listed, but only abnormal results are displayed) Labs Reviewed - No data to  display  EKG None  Radiology No results found.  Procedures .Ear Cerumen Removal  Date/Time: 08/17/2019 12:38 PM Performed by: Nettie Elm, PA-C Authorized by: Nettie Elm, PA-C   Consent:    Consent obtained:  Verbal   Consent given by:  Patient   Risks discussed:  Bleeding, dizziness, infection, incomplete removal, pain and TM perforation   Alternatives discussed:  No treatment, delayed treatment, alternative treatment, observation and referral Procedure details:    Location:  R ear and L ear   Procedure type: irrigation   Post-procedure details:    Inspection:  Macerated skin and bleeding   Hearing quality:  Improved   Patient tolerance of procedure:  Tolerated well, no immediate complications   (including critical care time)  Medications Ordered in ED Medications  ciprofloxacin-dexamethasone (CIPRODEX) 0.3-0.1 % OTIC (EAR) suspension 4 drop (has no administration in time range)  carbamide peroxide (DEBROX) 6.5 % OTIC (EAR) solution 5 drop (5 drops Both EARS Given 08/17/19 1127)    ED Course  I have reviewed the triage vital signs and the nursing notes.  Pertinent labs & imaging results that were available during my care of the patient were reviewed by me and considered in my medical decision making (see chart for details).  59 year old male presents for evaluation of sensation of muffled hearing right greater than left.  Thought he saw blood after using a Q-tip to his right ear today.  He denies any headache, lightheadedness, dizziness, neck pain, neck stiffness, pulsatile tinnitus, recent injury or trauma.  No prior history of drainage or recent swimming activities.  Patient neurovascularly intact.  Does have bilateral cerumen impactions complete to left partial to right.  His right TM that is not obstructed does appear to be mildly erythematous however no gross blood.  No tenderness with retraction of pinna or palpation of tragus.  No tenderness over  bilateral mastoid, full neck range of motion without difficulty.  Plan on Debrox, ear irrigation reevaluate  Patient reassessed. Bilateral TMs with successful irrigation.  Does have macerated bilateral TMs  with some slight bleeding at his right TM.  Will place on antibiotics and have follow-up outpatient.   The patient has been appropriately medically screened and/or stabilized in the ED. I have low suspicion for any other emergent medical condition which would require further screening, evaluation or treatment in the ED or require inpatient management.  Patient is hemodynamically stable and in no acute distress.  Patient able to ambulate in department prior to ED.  Evaluation does not show acute pathology that would require ongoing or additional emergent interventions while in the emergency department or further inpatient treatment.  I have discussed the diagnosis with the patient and answered all questions.  Pain is been managed while in the emergency department and patient has no further complaints prior to discharge.  Patient is comfortable with plan discussed in room and is stable for discharge at this time.  I have discussed strict return precautions for returning to the emergency department.  Patient was encouraged to follow-up with PCP/specialist refer to at discharge.   MDM Rules/Calculators/A&P                       Final Clinical Impression(s) / ED Diagnoses Final diagnoses:  Bilateral impacted cerumen    Rx / DC Orders ED Discharge Orders         Ordered    ciprofloxacin-dexamethasone (CIPRODEX) OTIC suspension  2 times daily     08/17/19 1240           Juaquin Ludington A, PA-C 08/17/19 1240    Isla Pence, MD 08/17/19 1515

## 2019-08-19 MED FILL — CIPRODEX 0.3-0.1 % SUSP: 0.3-0.1 | 14 days supply | Qty: 8 | Fill #0

## 2019-08-20 DIAGNOSIS — Z5181 Encounter for therapeutic drug level monitoring: Secondary | ICD-10-CM | POA: Diagnosis not present

## 2019-08-27 DIAGNOSIS — Z5181 Encounter for therapeutic drug level monitoring: Secondary | ICD-10-CM | POA: Diagnosis not present

## 2019-09-03 DIAGNOSIS — Z5181 Encounter for therapeutic drug level monitoring: Secondary | ICD-10-CM | POA: Diagnosis not present

## 2019-09-09 ENCOUNTER — Emergency Department (HOSPITAL_COMMUNITY)
Admission: EM | Admit: 2019-09-09 | Discharge: 2019-09-09 | Disposition: A | Discharge status: Home / Self Care | Payer: Medicaid Other | Attending: Emergency Medicine | Admitting: Emergency Medicine

## 2019-09-09 ENCOUNTER — Encounter (HOSPITAL_COMMUNITY): Payer: Self-pay

## 2019-09-09 DIAGNOSIS — E119 Type 2 diabetes mellitus without complications: Secondary | ICD-10-CM | POA: Insufficient documentation

## 2019-09-09 DIAGNOSIS — Z7984 Long term (current) use of oral hypoglycemic drugs: Secondary | ICD-10-CM | POA: Diagnosis not present

## 2019-09-09 DIAGNOSIS — K0889 Other specified disorders of teeth and supporting structures: Secondary | ICD-10-CM | POA: Diagnosis not present

## 2019-09-09 DIAGNOSIS — I110 Hypertensive heart disease with heart failure: Secondary | ICD-10-CM | POA: Insufficient documentation

## 2019-09-09 DIAGNOSIS — F17210 Nicotine dependence, cigarettes, uncomplicated: Secondary | ICD-10-CM | POA: Diagnosis not present

## 2019-09-09 DIAGNOSIS — I5033 Acute on chronic diastolic (congestive) heart failure: Secondary | ICD-10-CM | POA: Insufficient documentation

## 2019-09-09 DIAGNOSIS — Z7982 Long term (current) use of aspirin: Secondary | ICD-10-CM | POA: Insufficient documentation

## 2019-09-09 MED ORDER — AMOXICILLIN 500 MG PO CAPS: 500.0000 mg | ORAL_CAPSULE | Freq: Three times a day (TID) | ORAL | 0 refills | Status: DC

## 2019-09-09 MED ORDER — HYDROCODONE-ACETAMINOPHEN 5-325 MG PO TABS
1.0000 | ORAL_TABLET | Freq: Once | ORAL | Status: AC
  Administered 2019-09-09: 1 via ORAL
  Filled 2019-09-09: qty 1

## 2019-09-09 MED ORDER — CELECOXIB 200 MG PO CAPS: 200.0000 mg | ORAL_CAPSULE | Freq: Two times a day (BID) | ORAL | 0 refills | Status: AC

## 2019-09-09 MED ORDER — ACETAMINOPHEN 500 MG PO TABS: 500.0000 mg | ORAL_TABLET | Freq: Four times a day (QID) | ORAL | 0 refills | Status: DC | PRN

## 2019-09-09 MED FILL — CELECOXIB 200 MG CAPSULE: 200 | 10 days supply | Qty: 20 | Fill #0

## 2019-09-09 MED FILL — AMOXICILLIN 500 MG CAPSULE: 500 | 7 days supply | Qty: 21 | Fill #0

## 2019-09-09 NOTE — Discharge Instructions (Addendum)
Please follow-up with your primary care provider regarding today's encounter as well as your dentist, Dr. Quincy Simmonds DDS, for ongoing evaluation and management.  Please take your medications, as prescribed.  Do not combine Celebrex with other NSAIDs.  Take the amoxicillin for the entire 7-day course and do not stop early even if your symptoms improve.  Return to the ED or seek immediate medical attention should you experience any new or worsening symptoms.  You were given narcotic and or sedative medications while in the emergency department. Do not drive. Do not use machinery or power tools. Do not sign legal documents. Do not drink alcohol. Do not take sleeping pills. Do not supervise children by yourself. Do not participate in activities that require climbing or being in high places.

## 2019-09-09 NOTE — ED Provider Notes (Signed)
[facility] Provider Note   CSN: [id number] Arrival date & time: 09/09/19  1231     History Chief Complaint  Patient presents with  . Dental Pain    Ronald Marsh is a 59 y.o. male with no relevant past medical history presents to the ED with a 4-day history of right upper dental pain.  Patient reports that he used to go to Dr. Quincy Simmonds DMD here in [place], but has not been there "in a minute".  He does have outpatient primary care follow-up and had plan to speak with them about taking an antibiotic and some pain medication.  He states it is difficult to eat on the affected side, but denies any difficulty swallowing, wheezing, shortness of breath, trismus, drooling, fevers or chills, headache or dizziness, voice change, or other symptoms.  He has been taking ibuprofen without any significant relief.  HPI     Past Medical History:  Diagnosis Date  . Diabetes mellitus without complication ([place])   . Heart failure ([place])   . Hepatitis C   . History of CVA (cerebrovascular accident) 02/2010   Left periventricular subcortical ischemic infarction // Residual RUE and RLE weakness  . History of depression    surrounding original stroke - has since resolved (04/2012)  . Hyperlipidemia   . Hypertension   . Internal carotid artery stenosis    BL and mild per CT angiogram (05/2010)  . Stroke ([place]) 2012  . Tobacco abuse     Patient Active Problem List   Diagnosis Date Noted  . Hammer toes of both feet 03/22/2019  . Coagulation disorder ([place]) 03/22/2019  . Pain due to onychomycosis of toenails of both feet 12/11/2018  . Chronic hepatitis C without hepatic coma ([place]) 06/13/2018  . Toenail fungus 06/12/2018  . Bilateral bunions 06/12/2018  . Acute on chronic diastolic CHF (congestive heart failure) ([place]) 05/06/2018  . Cardiomyopathy ([place]) 01/31/2018  . History of substance abuse ([place]) 02/19/2016  . Lumbar radiculopathy 10/01/2015  . Diabetes  mellitus type 2, controlled ([place]) 07/03/2012  . Tobacco abuse   . Hypertension   . Hyperlipidemia   . History of CVA (cerebrovascular accident) 02/06/2010    History reviewed. No pertinent surgical history.     Family History  Problem Relation Age of Onset  . Diabetes Mother   . Heart failure Mother   . Hypertension Mother   . Hyperlipidemia Mother   . Glaucoma Mother   . Lung cancer Father   . Hypertension Brother   . Diabetes Brother   . Pancreatic cancer Brother   . Diabetes Sister   . Arthritis Sister     Social History   Tobacco Use  . Smoking status: Current Some Day Smoker    Packs/day: 0.30    Years: 31.00    Pack years: 9.30    Types: Cigarettes    Start date: 04/25/1981  . Smokeless tobacco: Never Used  . Tobacco comment: up to 3 cigs/day; cutting back.  Substance Use Topics  . Alcohol use: Not Currently    Comment: occasional  . Drug use: No    Comment: Former smoke crack     Home Medications Prior to Admission medications   Medication Sig Start Date End Date Taking? Authorizing Provider  Accu-Chek FastClix Lancets MISC Use as instructed. Inject into the skin once daily. E11.9 03/12/19   [name], NP  acetaminophen (TYLENOL) 500 MG tablet Take 1 tablet (500 mg total) by mouth every 6 (six)  hours as needed for moderate pain. 09/09/19   Corena Herter, PA-C  amLODipine (NORVASC) 10 MG tablet TAKE 1 TABLET(10 MG) BY MOUTH DAILY 07/30/19   Lyda Jester M, PA-C  amoxicillin (AMOXIL) 500 MG capsule Take 1 capsule (500 mg total) by mouth 3 (three) times daily. 09/09/19   Corena Herter, PA-C  aspirin 81 MG tablet Take 1 tablet (81 mg total) by mouth daily. 06/12/18   Elsie Stain, MD  atorvastatin (LIPITOR) 80 MG tablet Take 1 tablet (80 mg total) by mouth daily. 03/12/19 07/02/20  [name], NP  Blood Glucose Calibration (ACCU-CHEK GUIDE CONTROL) LIQD 1 each by In Vitro route once as needed for up to 1 dose. E11.9 10/05/18   [name], NP  Blood Pressure Monitor DEVI Please provide patient with insurance approved blood pressure monitor 10/14/18   [name], NP  carvedilol (COREG) 6.25 MG tablet Take 1 tablet (6.25 mg total) by mouth 2 (two) times daily with a meal. 03/28/19   Bensimhon, Shaune Pascal, MD  celecoxib (CELEBREX) 200 MG capsule Take 1 capsule (200 mg total) by mouth 2 (two) times daily for 10 days. 09/09/19 09/19/19  Corena Herter, PA-C  ciprofloxacin-dexamethasone (CIPRODEX) OTIC suspension Place 4 drops into the right ear 2 (two) times daily. 08/17/19   Henderly, Britni A, PA-C  clopidogrel (PLAVIX) 75 MG tablet Take 1 tablet (75 mg total) by mouth daily. 02/14/19   Lyda Jester M, PA-C  glucose blood (ACCU-CHEK GUIDE) test strip Use as instructed. Check blood glucose by fingerstick once per day.  E11.9 03/12/19   [name], NP  isosorbide-hydrALAZINE (BIDIL) 20-37.5 MG tablet Take 2 tablets by mouth 3 (three) times daily. 03/20/19   Bensimhon, Shaune Pascal, MD  metFORMIN (GLUCOPHAGE) 500 MG tablet Take 1 tablet (500 mg total) by mouth 2 (two) times daily with a meal. 03/12/19 06/10/19  [name], NP  naproxen (NAPROSYN) 375 MG tablet Take 1 tablet (375 mg total) by mouth 2 (two) times daily as needed for moderate pain. 01/23/19   [name], NP  neomycin-polymyxin-hydrocortisone (CORTISPORIN) OTIC solution Apply 2-3 drops to the ingrown toenail site twice daily. Cover with band-aid. 04/22/19   [name], DPM  spironolactone (ALDACTONE) 25 MG tablet TAKE 1 TABLET(25 MG) BY MOUTH DAILY 05/22/19   Bensimhon, Shaune Pascal, MD  traMADol (ULTRAM) 50 MG tablet Take 1 tablet (50 mg total) by mouth 3 (three) times daily. 04/22/19   [name], DPM    Allergies    Benicar [olmesartan], Ace inhibitors, and Angiotensin receptor blockers  Review of Systems   Review of Systems  Constitutional: Negative for fever.  HENT: Positive for dental problem. Negative for drooling, facial swelling, trouble  swallowing and voice change.   Respiratory: Negative for shortness of breath and wheezing.     Physical Exam Updated Vital Signs BP 140/89   Pulse (!) 106   Temp 98.3 F (36.8 C) (Oral)   Resp 18   Ht 6' 2.5" (1.892 m)   Wt 95.3 kg   SpO2 98%   BMI 26.60 kg/m   Physical Exam Vitals and nursing note reviewed. Exam conducted with a chaperone present.  Constitutional:      Appearance: Normal appearance.  HENT:     Head: Normocephalic and atraumatic.     Comments: No facial swelling or asymmetry.    Mouth/Throat:     Comments: Significant tenderness involving #5 tooth.  Carie noted.  Mild  surrounding erythema.  No masses or areas of fluctuance concerning for abscess.  No trismus.  Tolerating secretions well.  Patent oropharynx.  Poor dentition throughout. Eyes:     General: No scleral icterus.    Conjunctiva/sclera: Conjunctivae normal.  Cardiovascular:     Rate and Rhythm: Normal rate and regular rhythm.     Pulses: Normal pulses.     Heart sounds: Normal heart sounds.  Pulmonary:     Effort: Pulmonary effort is normal. No respiratory distress.     Breath sounds: Normal breath sounds. No wheezing.  Skin:    General: Skin is dry.     Capillary Refill: Capillary refill takes less than 2 seconds.  Neurological:     Mental Status: He is alert and oriented to person, place, and time.     GCS: GCS eye subscore is 4. GCS verbal subscore is 5. GCS motor subscore is 6.  Psychiatric:        Mood and Affect: Mood normal.        Behavior: Behavior normal.        Thought Content: Thought content normal.     ED Results / Procedures / Treatments   Labs (all labs ordered are listed, but only abnormal results are displayed) Labs Reviewed - No data to display  EKG None  Radiology No results found.  Procedures Procedures (including critical care time)  Medications Ordered in ED Medications  HYDROcodone-acetaminophen (NORCO/VICODIN) 5-325 MG per tablet 1 tablet (has no  administration in time range)    ED Course  I have reviewed the triage vital signs and the nursing notes.  Pertinent labs & imaging results that were available during my care of the patient were reviewed by me and considered in my medical decision making (see chart for details).    MDM Rules/Calculators/A&P                      Patient presents for dental pain involving #5 tooth.  Patient has poor dentition throughout and has evidence of numerous dental extractions.  His physical exam however is overall reassuring as there is no trismus, drooling, large masses or fluctuance concerning for abscess, facial swelling, tongue swelling, floor of mouth induration, or other concerning findings.  His vital signs are stable and within normal limits.  While his heart rate was mildly elevated during triage, it was normal on my examination.  Given his discomfort despite NSAIDs, will provide 1 Vicodin here in the ED, however will discharge with Celebrex, Tylenol, and a 7-day course of amoxicillin.  Plan is for him to follow-up with his dentist regarding today's encounter and for definitive intervention.  He is also advised to follow-up with his primary care provider.  Strict ED return precautions discussed.  Patient voices understanding and is agreeable to plan.  Final Clinical Impression(s) / ED Diagnoses Final diagnoses:  Pain, dental    Rx / DC Orders ED Discharge Orders         Ordered    amoxicillin (AMOXIL) 500 MG capsule  3 times daily     09/09/19 1520    celecoxib (CELEBREX) 200 MG capsule  2 times daily     09/09/19 1520    acetaminophen (TYLENOL) 500 MG tablet  Every 6 hours PRN     09/09/19 1520           Reita Chard 09/09/19 1527    Tegeler, Gwenyth Allegra, MD 09/09/19 1531

## 2019-09-09 NOTE — ED Triage Notes (Signed)
Pt reports right upper dental pain for 2 days, using tylenol without relief. No swelling noted.

## 2019-09-10 DIAGNOSIS — Z5181 Encounter for therapeutic drug level monitoring: Secondary | ICD-10-CM | POA: Diagnosis not present

## 2019-09-17 DIAGNOSIS — Z5181 Encounter for therapeutic drug level monitoring: Secondary | ICD-10-CM | POA: Diagnosis not present

## 2019-09-19 ENCOUNTER — Other Ambulatory Visit (HOSPITAL_COMMUNITY): Payer: Self-pay | Admitting: Cardiology

## 2019-09-24 DIAGNOSIS — Z5181 Encounter for therapeutic drug level monitoring: Secondary | ICD-10-CM | POA: Diagnosis not present

## 2019-10-01 DIAGNOSIS — Z5181 Encounter for therapeutic drug level monitoring: Secondary | ICD-10-CM | POA: Diagnosis not present

## 2019-10-08 DIAGNOSIS — Z5181 Encounter for therapeutic drug level monitoring: Secondary | ICD-10-CM | POA: Diagnosis not present

## 2019-10-09 ENCOUNTER — Other Ambulatory Visit: Payer: Self-pay | Admitting: Nurse Practitioner

## 2019-10-09 DIAGNOSIS — E1142 Type 2 diabetes mellitus with diabetic polyneuropathy: Secondary | ICD-10-CM

## 2019-10-10 ENCOUNTER — Other Ambulatory Visit: Payer: Self-pay

## 2019-10-14 ENCOUNTER — Other Ambulatory Visit: Payer: Self-pay

## 2019-10-15 DIAGNOSIS — Z5181 Encounter for therapeutic drug level monitoring: Secondary | ICD-10-CM | POA: Diagnosis not present

## 2019-10-21 ENCOUNTER — Telehealth: Payer: Self-pay

## 2019-10-21 ENCOUNTER — Telehealth: Payer: Self-pay | Admitting: *Deleted

## 2019-10-21 ENCOUNTER — Other Ambulatory Visit: Payer: Self-pay | Admitting: Family Medicine

## 2019-10-21 DIAGNOSIS — E1142 Type 2 diabetes mellitus with diabetic polyneuropathy: Secondary | ICD-10-CM

## 2019-10-21 MED ORDER — METFORMIN HCL 500 MG PO TABS: 500.0000 mg | ORAL_TABLET | Freq: Two times a day (BID) | ORAL | 0 refills | Status: DC

## 2019-10-21 NOTE — Telephone Encounter (Signed)
Patient is currently not taking potassium

## 2019-10-21 NOTE — Telephone Encounter (Signed)
Refill sent.

## 2019-10-21 NOTE — Telephone Encounter (Signed)
Received refill request from [facility] requesting a refill on potassium 20 meq.

## 2019-10-21 NOTE — Telephone Encounter (Signed)
1) Medication(s) Requested (by name): metformin 500 mg & potassium cl   2) Pharmacy of Choice:  [facility] [address]  3) Special Requests: appt Wednesday 10/24/19   Approved medications will be sent to the pharmacy, we will reach out if there is an issue.  Requests made after 3pm may not be addressed until the following business day!  If a patient is unsure of the name of the medication(s) please note and ask patient to call back when they are able to provide all info, do not send to responsible party until all information is available!

## 2019-10-22 DIAGNOSIS — Z5181 Encounter for therapeutic drug level monitoring: Secondary | ICD-10-CM | POA: Diagnosis not present

## 2019-10-23 ENCOUNTER — Ambulatory Visit: Payer: Medicaid Other | Attending: Nurse Practitioner | Admitting: Physician Assistant

## 2019-10-23 ENCOUNTER — Other Ambulatory Visit: Payer: Self-pay

## 2019-10-23 VITALS — BP 125/66 | HR 67 | Temp 97.7°F | Ht 74.5 in | Wt 192.2 lb

## 2019-10-23 DIAGNOSIS — I110 Hypertensive heart disease with heart failure: Secondary | ICD-10-CM | POA: Insufficient documentation

## 2019-10-23 DIAGNOSIS — I5033 Acute on chronic diastolic (congestive) heart failure: Secondary | ICD-10-CM | POA: Insufficient documentation

## 2019-10-23 DIAGNOSIS — Z791 Long term (current) use of non-steroidal anti-inflammatories (NSAID): Secondary | ICD-10-CM | POA: Insufficient documentation

## 2019-10-23 DIAGNOSIS — E785 Hyperlipidemia, unspecified: Secondary | ICD-10-CM | POA: Insufficient documentation

## 2019-10-23 DIAGNOSIS — Z7902 Long term (current) use of antithrombotics/antiplatelets: Secondary | ICD-10-CM | POA: Diagnosis not present

## 2019-10-23 DIAGNOSIS — E1142 Type 2 diabetes mellitus with diabetic polyneuropathy: Secondary | ICD-10-CM | POA: Insufficient documentation

## 2019-10-23 DIAGNOSIS — E7800 Pure hypercholesterolemia, unspecified: Secondary | ICD-10-CM | POA: Insufficient documentation

## 2019-10-23 DIAGNOSIS — Z8673 Personal history of transient ischemic attack (TIA), and cerebral infarction without residual deficits: Secondary | ICD-10-CM | POA: Insufficient documentation

## 2019-10-23 DIAGNOSIS — Z7984 Long term (current) use of oral hypoglycemic drugs: Secondary | ICD-10-CM | POA: Insufficient documentation

## 2019-10-23 DIAGNOSIS — I10 Essential (primary) hypertension: Secondary | ICD-10-CM

## 2019-10-23 DIAGNOSIS — Z7982 Long term (current) use of aspirin: Secondary | ICD-10-CM | POA: Insufficient documentation

## 2019-10-23 DIAGNOSIS — Z79899 Other long term (current) drug therapy: Secondary | ICD-10-CM | POA: Diagnosis not present

## 2019-10-23 LAB — POCT GLYCOSYLATED HEMOGLOBIN (HGB A1C): Hemoglobin A1C: 5.7 % — AB (ref 4.0–5.6)

## 2019-10-23 LAB — GLUCOSE, POCT (MANUAL RESULT ENTRY): POC Glucose: 150 mg/dl — AB (ref 70–99)

## 2019-10-23 MED ORDER — ATORVASTATIN CALCIUM 80 MG PO TABS: 80.0000 mg | ORAL_TABLET | Freq: Every day | ORAL | 2 refills | Status: DC

## 2019-10-23 MED ORDER — CARVEDILOL 6.25 MG PO TABS: 6.2500 mg | ORAL_TABLET | Freq: Two times a day (BID) | ORAL | 1 refills | Status: DC

## 2019-10-23 MED ORDER — BIDIL 20-37.5 MG PO TABS: 2.0000 | ORAL_TABLET | Freq: Three times a day (TID) | ORAL | 1 refills | Status: DC

## 2019-10-23 MED ORDER — CLOPIDOGREL BISULFATE 75 MG PO TABS: 75.0000 mg | ORAL_TABLET | Freq: Every day | ORAL | 1 refills | Status: DC

## 2019-10-23 MED ORDER — AMLODIPINE BESYLATE 10 MG PO TABS: ORAL_TABLET | ORAL | 1 refills | Status: DC

## 2019-10-23 MED ORDER — SPIRONOLACTONE 25 MG PO TABS: ORAL_TABLET | ORAL | 1 refills | Status: DC

## 2019-10-23 MED ORDER — METFORMIN HCL 500 MG PO TABS: 500.0000 mg | ORAL_TABLET | Freq: Two times a day (BID) | ORAL | 1 refills | Status: DC

## 2019-10-23 NOTE — Progress Notes (Signed)
Patient ID: Ronald Marsh, male   DOB: 01-02-1961, 59 y.o.   [name]: [id number]   Ronald Marsh, is a 59 y.o. male  OMB:[id number]  GTX:[id number]  DOB - [birth year]  Subjective:  Chief Complaint and HPI: Ronald Marsh is a 59 y.o. male here today for med RF.  Doing well.  No problems or issues other than running out of meds.    ROS:   Constitutional:  No f/c, No night sweats, No unexplained weight loss. EENT:  No vision changes, No blurry vision, No hearing changes. No mouth, throat, or ear problems.  Respiratory: No cough, No SOB Cardiac: No CP, no palpitations GI:  No abd pain, No N/V/D. GU: No Urinary s/sx Musculoskeletal: +back pain Neuro: No headache, no dizziness, no motor weakness.  Skin: No rash Endocrine:  No polydipsia. No polyuria.  Psych: Denies SI/HI  No problems updated.  ALLERGIES: Allergies  Allergen Reactions  . Benicar [Olmesartan] Anaphylaxis and Swelling    Patient cannot name the site of swelling (??)  . Ace Inhibitors Swelling    Patient cannot name the site of swelling (??)  . Angiotensin Receptor Blockers Swelling    Patient cannot name the site of swelling (??)    PAST MEDICAL HISTORY: Past Medical History:  Diagnosis Date  . Diabetes mellitus without complication ([place])   . Heart failure ([place])   . Hepatitis C   . History of CVA (cerebrovascular accident) 02/2010   Left periventricular subcortical ischemic infarction // Residual RUE and RLE weakness  . History of depression    surrounding original stroke - has since resolved (04/2012)  . Hyperlipidemia   . Hypertension   . Internal carotid artery stenosis    BL and mild per CT angiogram (05/2010)  . Stroke ([place]) 2012  . Tobacco abuse     MEDICATIONS AT HOME: Prior to Admission medications   Medication Sig Start Date End Date Taking? Authorizing Provider  Accu-Chek FastClix Lancets MISC Use as instructed. Inject into the skin once daily. E11.9 03/12/19  Yes [name], NP   acetaminophen (TYLENOL) 500 MG tablet Take 1 tablet (500 mg total) by mouth every 6 (six) hours as needed for moderate pain. 09/09/19  Yes Corena Herter, PA-C  amLODipine (NORVASC) 10 MG tablet TAKE 1 TABLET(10 MG) BY MOUTH DAILY 10/23/19  Yes Argentina Donovan, PA-C  aspirin 81 MG tablet Take 1 tablet (81 mg total) by mouth daily. 06/12/18  Yes Elsie Stain, MD  atorvastatin (LIPITOR) 80 MG tablet Take 1 tablet (80 mg total) by mouth daily. 10/23/19 01/21/20 Yes Aizley Stenseth, Dionne Bucy, PA-C  Blood Glucose Calibration (ACCU-CHEK GUIDE CONTROL) LIQD 1 each by In Vitro route once as needed for up to 1 dose. E11.9 10/05/18  Yes [name], NP  Blood Pressure Monitor DEVI Please provide patient with insurance approved blood pressure monitor 10/14/18  Yes [name], NP  carvedilol (COREG) 6.25 MG tablet Take 1 tablet (6.25 mg total) by mouth 2 (two) times daily with a meal. 10/23/19  Yes Laquon Emel M, PA-C  clopidogrel (PLAVIX) 75 MG tablet Take 1 tablet (75 mg total) by mouth daily. 10/23/19  Yes Freeman Caldron M, PA-C  glucose blood (ACCU-CHEK GUIDE) test strip Use as instructed. Check blood glucose by fingerstick once per day.  E11.9 03/12/19  Yes [name], NP  isosorbide-hydrALAZINE (BIDIL) 20-37.5 MG tablet Take 2 tablets by mouth 3 (three) times daily. 10/23/19  Yes Freeman Caldron M, PA-C  metFORMIN (GLUCOPHAGE) 500  MG tablet Take 1 tablet (500 mg total) by mouth 2 (two) times daily with a meal. 10/23/19 11/22/19 Yes Mecca Guitron, Dionne Bucy, PA-C  spironolactone (ALDACTONE) 25 MG tablet TAKE 1 TABLET(25 MG) BY MOUTH DAILY 10/23/19  Yes Stavroula Rohde M, PA-C  naproxen (NAPROSYN) 375 MG tablet Take 1 tablet (375 mg total) by mouth 2 (two) times daily as needed for moderate pain. Patient not taking: Reported on 10/23/2019 01/23/19   [name], NP  neomycin-polymyxin-hydrocortisone (CORTISPORIN) OTIC solution Apply 2-3 drops to the ingrown toenail site twice daily. Cover with  band-aid. Patient not taking: Reported on 10/23/2019 04/22/19   [name], DPM     Objective:  EXAM:   Vitals:   10/23/19 1015  BP: 125/66  Pulse: 67  Temp: 97.7 F (36.5 C)  TempSrc: Temporal  SpO2: 97%  Weight: 192 lb 3.2 oz (87.2 kg)  Height: 6' 2.5" (1.892 m)    General appearance : A&OX3. NAD. Non-toxic-appearing HEENT: Atraumatic and Normocephalic.  PERRLA. EOM intact.   Chest/Lungs:  Breathing-non-labored, Good air entry bilaterally, breath sounds normal without rales, rhonchi, or wheezing  CVS: S1 S2 regular, no murmurs, gallops, rubs  Extremities: Bilateral Lower Ext shows no edema, both legs are warm to touch with = pulse throughout Neurology:  CN II-XII grossly intact, Non focal.   Psych:  TP linear. J/I WNL. Normal speech. Appropriate eye contact and affect.  Skin:  No Rash  Data Review Lab Results  Component Value Date   HGBA1C 5.7 (A) 10/23/2019   HGBA1C 6.0 (A) 12/07/2018   HGBA1C 6.4 (A) 06/12/2018     Assessment & Plan   1. Controlled type 2 diabetes mellitus with diabetic polyneuropathy, without long-term current use of insulin (HCC) Controlled-continue current regimen - Glucose (CBG) - HgB A1c - metFORMIN (GLUCOPHAGE) 500 MG tablet; Take 1 tablet (500 mg total) by mouth 2 (two) times daily with a meal.  Dispense: 180 tablet; Refill: 1  2. Pure hypercholesterolemia - atorvastatin (LIPITOR) 80 MG tablet; Take 1 tablet (80 mg total) by mouth daily.  Dispense: 90 tablet; Refill: 2  3. Essential hypertension Controlled-continue current regimen - amLODipine (NORVASC) 10 MG tablet; TAKE 1 TABLET(10 MG) BY MOUTH DAILY  Dispense: 90 tablet; Refill: 1 - isosorbide-hydrALAZINE (BIDIL) 20-37.5 MG tablet; Take 2 tablets by mouth 3 (three) times daily.  Dispense: 540 tablet; Refill: 1 - Comprehensive metabolic panel  4. Acute on chronic diastolic CHF (congestive heart failure) (HCC) - carvedilol (COREG) 6.25 MG tablet; Take 1 tablet (6.25 mg total) by  mouth 2 (two) times daily with a meal.  Dispense: 180 tablet; Refill: 1 - spironolactone (ALDACTONE) 25 MG tablet; TAKE 1 TABLET(25 MG) BY MOUTH DAILY  Dispense: 90 tablet; Refill: 1 - Comprehensive metabolic panel  5. History of CVA (cerebrovascular accident) - clopidogrel (PLAVIX) 75 MG tablet; Take 1 tablet (75 mg total) by mouth daily.  Dispense: 90 tablet; Refill: 1   Patient have been counseled extensively about nutrition and exercise  Return in about 3 months (around 01/23/2020) for with PCP for chronic condidtions.  The patient was given clear instructions to go to ER or return to medical center if symptoms don't improve, worsen or new problems develop. The patient verbalized understanding. The patient was told to call to get lab results if they haven't heard anything in the next week.     Freeman Caldron, PA-C [facility] and [facility] [place], [place]   10/23/2019, 10:41 AM

## 2019-10-24 LAB — COMPREHENSIVE METABOLIC PANEL
ALT: 8 IU/L (ref 0–44)
AST: 13 IU/L (ref 0–40)
Albumin/Globulin Ratio: 1.5 (ref 1.2–2.2)
Albumin: 4.2 g/dL (ref 3.8–4.9)
Alkaline Phosphatase: 53 IU/L (ref 48–121)
BUN/Creatinine Ratio: 11 (ref 9–20)
BUN: 11 mg/dL (ref 6–24)
Bilirubin Total: 0.3 mg/dL (ref 0.0–1.2)
CO2: 22 mmol/L (ref 20–29)
Calcium: 9.1 mg/dL (ref 8.7–10.2)
Chloride: 105 mmol/L (ref 96–106)
Creatinine, Ser: 0.97 mg/dL (ref 0.76–1.27)
GFR calc Af Amer: 100 mL/min/{1.73_m2} (ref >59)
GFR calc non Af Amer: 87 mL/min/{1.73_m2} (ref >59)
Globulin, Total: 2.8 g/dL (ref 1.5–4.5)
Glucose: 113 mg/dL — ABNORMAL HIGH (ref 65–99)
Potassium: 4.4 mmol/L (ref 3.5–5.2)
Sodium: 139 mmol/L (ref 134–144)
Total Protein: 7 g/dL (ref 6.0–8.5)

## 2019-10-24 LAB — LIPID PANEL
Chol/HDL Ratio: 4.2 ratio (ref 0.0–5.0)
Cholesterol, Total: 171 mg/dL (ref 100–199)
HDL: 41 mg/dL (ref >39)
LDL Chol Calc (NIH): 118 mg/dL — ABNORMAL HIGH (ref 0–99)
Triglycerides: 62 mg/dL (ref 0–149)
VLDL Cholesterol Cal: 12 mg/dL (ref 5–40)

## 2019-10-29 DIAGNOSIS — Z5181 Encounter for therapeutic drug level monitoring: Secondary | ICD-10-CM | POA: Diagnosis not present

## 2019-11-06 DIAGNOSIS — Z23 Encounter for immunization: Secondary | ICD-10-CM | POA: Diagnosis not present

## 2019-11-13 DIAGNOSIS — Z5181 Encounter for therapeutic drug level monitoring: Secondary | ICD-10-CM | POA: Diagnosis not present

## 2019-12-02 ENCOUNTER — Encounter (HOSPITAL_COMMUNITY): Payer: Self-pay | Admitting: Emergency Medicine

## 2019-12-02 ENCOUNTER — Emergency Department (HOSPITAL_COMMUNITY)
Admission: EM | Admit: 2019-12-02 | Discharge: 2019-12-02 | Disposition: A | Discharge status: Home / Self Care | Payer: Medicaid Other | Attending: Emergency Medicine | Admitting: Emergency Medicine

## 2019-12-02 ENCOUNTER — Other Ambulatory Visit: Payer: Self-pay

## 2019-12-02 DIAGNOSIS — Y999 Unspecified external cause status: Secondary | ICD-10-CM | POA: Diagnosis not present

## 2019-12-02 DIAGNOSIS — I509 Heart failure, unspecified: Secondary | ICD-10-CM | POA: Diagnosis not present

## 2019-12-02 DIAGNOSIS — Z801 Family history of malignant neoplasm of trachea, bronchus and lung: Secondary | ICD-10-CM | POA: Insufficient documentation

## 2019-12-02 DIAGNOSIS — Z7982 Long term (current) use of aspirin: Secondary | ICD-10-CM | POA: Insufficient documentation

## 2019-12-02 DIAGNOSIS — Y929 Unspecified place or not applicable: Secondary | ICD-10-CM | POA: Insufficient documentation

## 2019-12-02 DIAGNOSIS — F17210 Nicotine dependence, cigarettes, uncomplicated: Secondary | ICD-10-CM | POA: Insufficient documentation

## 2019-12-02 DIAGNOSIS — Z800 Family history of malignant neoplasm of digestive organs: Secondary | ICD-10-CM | POA: Diagnosis not present

## 2019-12-02 DIAGNOSIS — I110 Hypertensive heart disease with heart failure: Secondary | ICD-10-CM | POA: Insufficient documentation

## 2019-12-02 DIAGNOSIS — S199XXA Unspecified injury of neck, initial encounter: Secondary | ICD-10-CM | POA: Diagnosis present

## 2019-12-02 DIAGNOSIS — Z79899 Other long term (current) drug therapy: Secondary | ICD-10-CM | POA: Insufficient documentation

## 2019-12-02 DIAGNOSIS — S161XXA Strain of muscle, fascia and tendon at neck level, initial encounter: Secondary | ICD-10-CM | POA: Diagnosis not present

## 2019-12-02 DIAGNOSIS — Y939 Activity, unspecified: Secondary | ICD-10-CM | POA: Diagnosis not present

## 2019-12-02 DIAGNOSIS — E1159 Type 2 diabetes mellitus with other circulatory complications: Secondary | ICD-10-CM | POA: Insufficient documentation

## 2019-12-02 MED ORDER — IBUPROFEN 400 MG PO TABS
600.0000 mg | ORAL_TABLET | Freq: Once | ORAL | Status: AC
  Administered 2019-12-02: 600 mg via ORAL
  Filled 2019-12-02: qty 1

## 2019-12-02 MED ORDER — HYDROCODONE-ACETAMINOPHEN 5-325 MG PO TABS
1.0000 | ORAL_TABLET | Freq: Once | ORAL | Status: AC
  Administered 2019-12-02: 1 via ORAL
  Filled 2019-12-02: qty 1

## 2019-12-02 NOTE — Discharge Instructions (Signed)
Take ibuprofen 600 mg every 6 hours as needed for pan/stiffness. Activity as tolerated.
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
## 2019-12-03 ENCOUNTER — Telehealth: Payer: Self-pay | Admitting: *Deleted
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
## 2019-12-03 NOTE — Telephone Encounter (Signed)
Attempted to contact pt to complete transition of care assessment; message states the number is not in service; unable to leave message.  Lenor Coffin, RN, BSN, [place] Patient [place] [phone]

## 2020-01-05 NOTE — Progress Notes (Signed)
ADVANCED HF CLINIC NOTE  Referring Physician: Dr. Joya Gaskins HF Cardiologist: DB  HPI:  Mr..Mode is  A 59 y/o male with multiple medical problems including substance abuse (tobacco, cocaine), HCV (s/p treatment), HTN, HL, DM2, previous TIA and  h/o systolic HF dating back to 2011. Echo 02/2010 with EF 50-55%  Admitted to [facility] 9/19 with acute HF. EF 25-30%. Had cath. No CAD.   Admitted to [facility] 12/19 with recurrent HF and HTN. Echo done 07/03/18 EF 25-30%   Echo 12/20 EF 40-45% RV ok Personally reviewe  Here for routine f/u. Feels very good. Exercising doing push-ups and walking. Does about 300 push ups every other day. Also doing a lot of walking. No CP, SOB, edema, orthopnea or PND. Taking all meds. Smoking a few cigs per day. No ETOH.   Had angioedema with Benicar so not on ACE/ARNI/ARB   Past Medical History:  Diagnosis Date  . Diabetes mellitus without complication ([place])   . Heart failure ([place])   . Hepatitis C   . History of CVA (cerebrovascular accident) 02/2010   Left periventricular subcortical ischemic infarction // Residual RUE and RLE weakness  . History of depression    surrounding original stroke - has since resolved (04/2012)  . Hyperlipidemia   . Hypertension   . Internal carotid artery stenosis    BL and mild per CT angiogram (05/2010)  . Stroke ([place]) 2012  . Tobacco abuse     Current Outpatient Medications  Medication Sig Dispense Refill  . Accu-Chek FastClix Lancets MISC Use as instructed. Inject into the skin once daily. E11.9 100 each 3  . acetaminophen (TYLENOL) 500 MG tablet Take 1 tablet (500 mg total) by mouth every 6 (six) hours as needed for moderate pain. 30 tablet 0  . amLODipine (NORVASC) 10 MG tablet TAKE 1 TABLET(10 MG) BY MOUTH DAILY 90 tablet 1  . aspirin 81 MG tablet Take 1 tablet (81 mg total) by mouth daily. 30 tablet 5  . atorvastatin (LIPITOR) 80 MG tablet Take 1 tablet (80 mg total) by mouth daily. 90 tablet 2  . Blood Glucose  Calibration (ACCU-CHEK GUIDE CONTROL) LIQD 1 each by In Vitro route once as needed for up to 1 dose. E11.9 1 each 0  . Blood Pressure Monitor DEVI Please provide patient with insurance approved blood pressure monitor 1 Device 0  . carvedilol (COREG) 6.25 MG tablet Take 1 tablet (6.25 mg total) by mouth 2 (two) times daily with a meal. 180 tablet 1  . clopidogrel (PLAVIX) 75 MG tablet Take 1 tablet (75 mg total) by mouth daily. 90 tablet 1  . glucose blood (ACCU-CHEK GUIDE) test strip Use as instructed. Check blood glucose by fingerstick once per day.  E11.9 100 each 12  . isosorbide-hydrALAZINE (BIDIL) 20-37.5 MG tablet Take 2 tablets by mouth in the morning and at bedtime.    . metFORMIN (GLUCOPHAGE) 500 MG tablet Take 1 tablet (500 mg total) by mouth 2 (two) times daily with a meal. 180 tablet 1  . neomycin-polymyxin-hydrocortisone (CORTISPORIN) OTIC solution Apply 2-3 drops to the ingrown toenail site twice daily. Cover with band-aid. 10 mL 0  . spironolactone (ALDACTONE) 25 MG tablet TAKE 1 TABLET(25 MG) BY MOUTH DAILY 90 tablet 1   No current facility-administered medications for this encounter.    Allergies  Allergen Reactions  . Benicar [Olmesartan] Anaphylaxis and Swelling    Patient cannot name the site of swelling (??)  . Ace Inhibitors Swelling    Patient cannot  name the site of swelling (??)  . Angiotensin Receptor Blockers Swelling    Patient cannot name the site of swelling (??)      Social History   Socioeconomic History  . Marital status: Married    Spouse name: Not on file  . Number of children: 1  . Years of education: 11th grade  . Highest education level: Not on file  Occupational History  . Occupation: On disability    Comment: previously worked for [employer] until his stroke in 2011.   Tobacco Use  . Smoking status: Current Some Day Smoker    Packs/day: 0.30    Years: 31.00    Pack years: 9.30    Types: Cigarettes    Start date: 04/25/1981  .  Smokeless tobacco: Never Used  . Tobacco comment: up to 3 cigs/day; cutting back.  Vaping Use  . Vaping Use: Never used  Substance and Sexual Activity  . Alcohol use: Not Currently    Comment: occasional  . Drug use: No    Comment: Former smoke crack   . Sexual activity: Yes  Other Topics Concern  . Not on file  Social History Narrative   Lives in [place] with wife.   Has been incarcerated multiple times, last time was in 2012 for 8 months.   Social Determinants of Health   Financial Resource Strain:   . Difficulty of Paying Living Expenses: Not on file  Food Insecurity:   . Worried About Charity fundraiser in the Last Year: Not on file  . Ran Out of Food in the Last Year: Not on file  Transportation Needs:   . Lack of Transportation (Medical): Not on file  . Lack of Transportation (Non-Medical): Not on file  Physical Activity:   . Days of Exercise per Week: Not on file  . Minutes of Exercise per Session: Not on file  Stress:   . Feeling of Stress : Not on file  Social Connections:   . Frequency of Communication with Friends and Family: Not on file  . Frequency of Social Gatherings with Friends and Family: Not on file  . Attends Religious Services: Not on file  . Active Member of Clubs or Organizations: Not on file  . Attends Archivist Meetings: Not on file  . Marital Status: Not on file  Intimate Partner Violence:   . Fear of Current or Ex-Partner: Not on file  . Emotionally Abused: Not on file  . Physically Abused: Not on file  . Sexually Abused: Not on file      Family History  Problem Relation Age of Onset  . Diabetes Mother   . Heart failure Mother   . Hypertension Mother   . Hyperlipidemia Mother   . Glaucoma Mother   . Lung cancer Father   . Hypertension Brother   . Diabetes Brother   . Pancreatic cancer Brother   . Diabetes Sister   . Arthritis Sister     Vitals:   01/06/20 0948  BP: 136/78  Pulse: 78  SpO2: 98%  Weight: 84.8 kg  (187 lb)    PHYSICAL EXAM: General:  Well appearing. No resp difficulty HEENT: normal Neck: supple. no JVD. Carotids 2+ bilat; no bruits. No lymphadenopathy or thryomegaly appreciated. Cor: PMI nondisplaced. Regular rate & rhythm. No rubs, gallops or murmurs. Lungs: clear Decreased breath sounds throughout  + boil on upper back Abdomen: soft, nontender, nondistended. No hepatosplenomegaly. No bruits or masses. Good bowel sounds. Extremities: no cyanosis,  clubbing, rash, edema Neuro: alert & orientedx3, cranial nerves grossly intact. moves all 4 extremities w/o difficulty. Affect pleasant    ASSESSMENT & PLAN:  1. Chronic systolic HF due to NICM - Echo 9/19 EF 25-30%, Echo 2/20 EF 25-30 - Cath 9/19 [facility]. No CAD - Echo 12/20 EF improved to 40-45% - Suspect due to HTN and substance use - Improved. NYHA I - Volume status looks good.  - Increase carvedilol to 12.5 bid - Continue spiro 25 daily. - No ACE/ARB/ARNI with angioedema to Benicar in past - Continue Bidil 2 tabs tid - Denies further use of ETOH and substance abuse - No ICD at this point with NYHA I - Will repeat echo. If EF still < 50% can consider Farxiga  2. HTN - Blood pressure well controlled. Continue current regimen.  3. Substance abuse - reports cessation  4. CVA - Continue DAPT and statin  - Minimal residual deficit   5. HCV - treated. Recent VL undetectable   6. Tobacco use - encouraged to quit   7. Lesion on back - likely a boil. Place hot compresses - If getting worse or no improvement in 1 week f/u with PCP for I&D  Glori Bickers, MD  10:00 AM

## 2020-01-06 ENCOUNTER — Other Ambulatory Visit: Payer: Self-pay

## 2020-01-06 ENCOUNTER — Ambulatory Visit (HOSPITAL_COMMUNITY)
Admission: RE | Admit: 2020-01-06 | Discharge: 2020-01-06 | Disposition: A | Discharge status: Home / Self Care | Payer: Medicaid Other | Source: Ambulatory Visit | Attending: Internal Medicine | Admitting: Internal Medicine

## 2020-01-06 VITALS — BP 136/78 | HR 78 | Wt 187.0 lb

## 2020-01-06 DIAGNOSIS — Z7902 Long term (current) use of antithrombotics/antiplatelets: Secondary | ICD-10-CM | POA: Insufficient documentation

## 2020-01-06 DIAGNOSIS — Z8349 Family history of other endocrine, nutritional and metabolic diseases: Secondary | ICD-10-CM | POA: Insufficient documentation

## 2020-01-06 DIAGNOSIS — F17210 Nicotine dependence, cigarettes, uncomplicated: Secondary | ICD-10-CM | POA: Insufficient documentation

## 2020-01-06 DIAGNOSIS — Z8673 Personal history of transient ischemic attack (TIA), and cerebral infarction without residual deficits: Secondary | ICD-10-CM | POA: Diagnosis not present

## 2020-01-06 DIAGNOSIS — Z833 Family history of diabetes mellitus: Secondary | ICD-10-CM | POA: Diagnosis not present

## 2020-01-06 DIAGNOSIS — Z8619 Personal history of other infectious and parasitic diseases: Secondary | ICD-10-CM | POA: Insufficient documentation

## 2020-01-06 DIAGNOSIS — I110 Hypertensive heart disease with heart failure: Secondary | ICD-10-CM | POA: Diagnosis not present

## 2020-01-06 DIAGNOSIS — Z79899 Other long term (current) drug therapy: Secondary | ICD-10-CM | POA: Insufficient documentation

## 2020-01-06 DIAGNOSIS — L989 Disorder of the skin and subcutaneous tissue, unspecified: Secondary | ICD-10-CM | POA: Insufficient documentation

## 2020-01-06 DIAGNOSIS — Z888 Allergy status to other drugs, medicaments and biological substances status: Secondary | ICD-10-CM | POA: Diagnosis not present

## 2020-01-06 DIAGNOSIS — I5033 Acute on chronic diastolic (congestive) heart failure: Secondary | ICD-10-CM | POA: Diagnosis not present

## 2020-01-06 DIAGNOSIS — Z7984 Long term (current) use of oral hypoglycemic drugs: Secondary | ICD-10-CM | POA: Diagnosis not present

## 2020-01-06 DIAGNOSIS — Z8249 Family history of ischemic heart disease and other diseases of the circulatory system: Secondary | ICD-10-CM | POA: Diagnosis not present

## 2020-01-06 DIAGNOSIS — I5022 Chronic systolic (congestive) heart failure: Secondary | ICD-10-CM | POA: Diagnosis not present

## 2020-01-06 DIAGNOSIS — I428 Other cardiomyopathies: Secondary | ICD-10-CM | POA: Diagnosis not present

## 2020-01-06 DIAGNOSIS — E119 Type 2 diabetes mellitus without complications: Secondary | ICD-10-CM | POA: Insufficient documentation

## 2020-01-06 DIAGNOSIS — I10 Essential (primary) hypertension: Secondary | ICD-10-CM | POA: Diagnosis not present

## 2020-01-06 DIAGNOSIS — Z7982 Long term (current) use of aspirin: Secondary | ICD-10-CM | POA: Diagnosis not present

## 2020-01-06 DIAGNOSIS — Z720 Tobacco use: Secondary | ICD-10-CM

## 2020-01-06 DIAGNOSIS — E785 Hyperlipidemia, unspecified: Secondary | ICD-10-CM | POA: Diagnosis not present

## 2020-01-06 LAB — BASIC METABOLIC PANEL
Anion gap: 12 (ref 5–15)
BUN: 15 mg/dL (ref 6–20)
CO2: 24 mmol/L (ref 22–32)
Calcium: 9 mg/dL (ref 8.9–10.3)
Chloride: 106 mmol/L (ref 98–111)
Creatinine, Ser: 0.86 mg/dL (ref 0.61–1.24)
GFR calc Af Amer: >60 mL/min (ref >60)
GFR calc non Af Amer: >60 mL/min (ref >60)
Glucose, Bld: 146 mg/dL — ABNORMAL HIGH (ref 70–99)
Potassium: 3.8 mmol/L (ref 3.5–5.1)
Sodium: 142 mmol/L (ref 135–145)

## 2020-01-06 LAB — BRAIN NATRIURETIC PEPTIDE: B Natriuretic Peptide: 18.5 pg/mL (ref 0.0–100.0)

## 2020-01-06 MED ORDER — CARVEDILOL 12.5 MG PO TABS: 12.5000 mg | ORAL_TABLET | Freq: Two times a day (BID) | ORAL | 6 refills | Status: DC

## 2020-01-06 NOTE — Addendum Note (Signed)
Encounter addended by: Malena Edman, RN on: 01/06/2020 10:14 AM  Actions taken: Order list changed, Diagnosis association updated, Charge Capture section accepted, Clinical Note Signed

## 2020-01-06 NOTE — Patient Instructions (Signed)
INCREASE Carvedilol 12.5mg  twice daily  Labs done today, your results will be available in MyChart, we will contact you for abnormal readings.  Please call our office in February to schedule your follow up appointment.  If the area on your back is not getting better, please follow up with your primary care doctor  Your physician has requested that you have an echocardiogram. Echocardiography is a painless test that uses sound waves to create images of your heart. It provides your doctor with information about the size and shape of your heart and how well your hearts chambers and valves are working. This procedure takes approximately one hour. There are no restrictions for this procedure.   If you have any questions or concerns before your next appointment please send [place] a message through [facility] or call our office at [phone].    TO LEAVE A MESSAGE FOR THE NURSE SELECT OPTION 2, PLEASE LEAVE A MESSAGE INCLUDING:  YOUR NAME  DATE OF BIRTH  CALL BACK NUMBER  REASON FOR CALL**this is important as we prioritize the call backs  YOU WILL RECEIVE A CALL BACK THE SAME DAY AS LONG AS YOU CALL BEFORE 4:00 PM   At the [facility], you and your health needs are our priority. As part of our continuing mission to provide you with exceptional heart care, we have created designated Provider Care Teams. These Care Teams include your primary Cardiologist (physician) and Advanced Practice Providers (APPs- Physician Assistants and Nurse Practitioners) who all work together to provide you with the care you need, when you need it.   You may see any of the following providers on your designated Care Team at your next follow up:  Dr Glori Bickers  Dr Haynes Kerns, NP  Lyda Jester, [place]  Audry Riles, PharmD   Please be sure to bring in all your medications bottles to every appointment.

## 2020-01-16 ENCOUNTER — Ambulatory Visit (HOSPITAL_COMMUNITY): Payer: Medicaid Other

## 2020-01-20 ENCOUNTER — Ambulatory Visit (HOSPITAL_COMMUNITY)
Admission: RE | Admit: 2020-01-20 | Discharge: 2020-01-20 | Disposition: A | Discharge status: Home / Self Care | Payer: Medicaid Other | Source: Ambulatory Visit | Attending: Internal Medicine | Admitting: Internal Medicine

## 2020-01-20 ENCOUNTER — Other Ambulatory Visit: Payer: Self-pay

## 2020-01-20 DIAGNOSIS — E785 Hyperlipidemia, unspecified: Secondary | ICD-10-CM | POA: Insufficient documentation

## 2020-01-20 DIAGNOSIS — E119 Type 2 diabetes mellitus without complications: Secondary | ICD-10-CM | POA: Diagnosis not present

## 2020-01-20 DIAGNOSIS — I110 Hypertensive heart disease with heart failure: Secondary | ICD-10-CM | POA: Insufficient documentation

## 2020-01-20 DIAGNOSIS — F17200 Nicotine dependence, unspecified, uncomplicated: Secondary | ICD-10-CM | POA: Insufficient documentation

## 2020-01-20 DIAGNOSIS — I5022 Chronic systolic (congestive) heart failure: Secondary | ICD-10-CM

## 2020-01-20 LAB — ECHOCARDIOGRAM COMPLETE
Area-P 1/2: 3.03 cm2
Calc EF: 46.7 %
S' Lateral: 3.6 cm
Single Plane A2C EF: 45.1 %
Single Plane A4C EF: 48.6 %

## 2020-01-20 NOTE — Progress Notes (Signed)
  Echocardiogram 2D Echocardiogram has been performed.  Ronald Marsh 01/20/2020, 11:08 AM

## 2020-01-27 ENCOUNTER — Ambulatory Visit: Payer: Medicaid Other | Attending: Nurse Practitioner | Admitting: Nurse Practitioner

## 2020-01-27 ENCOUNTER — Encounter: Payer: Self-pay | Admitting: Nurse Practitioner

## 2020-01-27 DIAGNOSIS — I5033 Acute on chronic diastolic (congestive) heart failure: Secondary | ICD-10-CM | POA: Diagnosis not present

## 2020-01-27 DIAGNOSIS — Z8673 Personal history of transient ischemic attack (TIA), and cerebral infarction without residual deficits: Secondary | ICD-10-CM | POA: Diagnosis not present

## 2020-01-27 DIAGNOSIS — Z1211 Encounter for screening for malignant neoplasm of colon: Secondary | ICD-10-CM | POA: Diagnosis not present

## 2020-01-27 DIAGNOSIS — E1142 Type 2 diabetes mellitus with diabetic polyneuropathy: Secondary | ICD-10-CM

## 2020-01-27 DIAGNOSIS — E7800 Pure hypercholesterolemia, unspecified: Secondary | ICD-10-CM | POA: Diagnosis not present

## 2020-01-27 DIAGNOSIS — I10 Essential (primary) hypertension: Secondary | ICD-10-CM

## 2020-01-27 MED ORDER — CARVEDILOL 12.5 MG PO TABS: 12.5000 mg | ORAL_TABLET | Freq: Two times a day (BID) | ORAL | 6 refills | Status: DC

## 2020-01-27 MED ORDER — ACCU-CHEK FASTCLIX LANCETS MISC: 3 refills | Status: DC

## 2020-01-27 MED ORDER — AMLODIPINE BESYLATE 10 MG PO TABS: ORAL_TABLET | ORAL | 1 refills | Status: DC

## 2020-01-27 MED ORDER — SPIRONOLACTONE 25 MG PO TABS: ORAL_TABLET | ORAL | 1 refills | Status: DC

## 2020-01-27 MED ORDER — CLOPIDOGREL BISULFATE 75 MG PO TABS: 75.0000 mg | ORAL_TABLET | Freq: Every day | ORAL | 1 refills | Status: DC

## 2020-01-27 MED ORDER — ATORVASTATIN CALCIUM 80 MG PO TABS: 80.0000 mg | ORAL_TABLET | Freq: Every day | ORAL | 2 refills | Status: DC

## 2020-01-27 MED ORDER — METFORMIN HCL 500 MG PO TABS: 500.0000 mg | ORAL_TABLET | Freq: Two times a day (BID) | ORAL | 1 refills | Status: DC

## 2020-01-27 MED ORDER — ISOSORB DINITRATE-HYDRALAZINE 20-37.5 MG PO TABS: 2.0000 | ORAL_TABLET | Freq: Two times a day (BID) | ORAL | 2 refills | Status: AC

## 2020-01-27 NOTE — Progress Notes (Deleted)
Virtual Visit via Telephone Note Due to national recommendations of social distancing due to [place] 19, telehealth visit is felt to be most appropriate for this patient at this time.  I discussed the limitations, risks, security and privacy concerns of performing an evaluation and management service by telephone and the availability of in person appointments. I also discussed with the patient that there may be a patient responsible charge related to this service. The patient expressed understanding and agreed to proceed.    I connected with Ronald Marsh on 01/27/20  at   1:30 PM EDT  EDT by telephone and verified that I am speaking with the correct person using two identifiers.   Consent I discussed the limitations, risks, security and privacy concerns of performing an evaluation and management service by telephone and the availability of in person appointments. I also discussed with the patient that there may be a patient responsible charge related to this service. The patient expressed understanding and agreed to proceed.   Location of Patient: Private  ***   Location of Provider: [facility]    Persons participating in Telemedicine visit: Ronald Rankins FNP-BC [place]    History of Present Illness: Telemedicine visit for: ***    Past Medical History:  Diagnosis Date  . Diabetes mellitus without complication ([place])   . Heart failure ([place])   . Hepatitis C   . History of CVA (cerebrovascular accident) 02/2010   Left periventricular subcortical ischemic infarction // Residual RUE and RLE weakness  . History of depression    surrounding original stroke - has since resolved (04/2012)  . Hyperlipidemia   . Hypertension   . Internal carotid artery stenosis    BL and mild per CT angiogram (05/2010)  . Stroke ([place]) 2012  . Tobacco abuse     No past surgical history on Marsh.  Family History  Problem Relation Age of Onset  .  Diabetes Mother   . Heart failure Mother   . Hypertension Mother   . Hyperlipidemia Mother   . Glaucoma Mother   . Lung cancer Father   . Hypertension Brother   . Diabetes Brother   . Pancreatic cancer Brother   . Diabetes Sister   . Arthritis Sister     Social History   Socioeconomic History  . Marital status: Married    Spouse name: Not on Marsh  . Number of children: 1  . Years of education: 11th grade  . Highest education level: Not on Marsh  Occupational History  . Occupation: On disability    Comment: previously worked for [employer] until his stroke in 2011.   Tobacco Use  . Smoking status: Current Some Day Smoker    Packs/day: 0.30    Years: 31.00    Pack years: 9.30    Types: Cigarettes    Start date: 04/25/1981  . Smokeless tobacco: Never Used  . Tobacco comment: up to 3 cigs/day; cutting back.  Vaping Use  . Vaping Use: Never used  Substance and Sexual Activity  . Alcohol use: Not Currently    Comment: occasional  . Drug use: No    Comment: Former smoke crack   . Sexual activity: Yes  Other Topics Concern  . Not on Marsh  Social History Narrative   Lives in [place] with wife.   Has been incarcerated multiple times, last time was in 2012 for 8 months.   Social Determinants of Health   Financial Resource Strain:   .  Difficulty of Paying Living Expenses: Not on Marsh  Food Insecurity:   . Worried About Charity fundraiser in the Last Year: Not on Marsh  . Ran Out of Food in the Last Year: Not on Marsh  Transportation Needs:   . Lack of Transportation (Medical): Not on Marsh  . Lack of Transportation (Non-Medical): Not on Marsh  Physical Activity:   . Days of Exercise per Week: Not on Marsh  . Minutes of Exercise per Session: Not on Marsh  Stress:   . Feeling of Stress : Not on Marsh  Social Connections:   . Frequency of Communication with Friends and Family: Not on Marsh  . Frequency of Social Gatherings with Friends and Family: Not on Marsh  .  Attends Religious Services: Not on Marsh  . Active Member of Clubs or Organizations: Not on Marsh  . Attends Archivist Meetings: Not on Marsh  . Marital Status: Not on Marsh     Observations/Objective: Awake, alert and oriented x 3   ROS  Assessment and Plan: Ronald Marsh was seen today for follow-up.  Diagnoses and all orders for this visit:  Controlled type 2 diabetes mellitus with diabetic polyneuropathy, without long-term current use of insulin (HCC) -     Microalbumin/Creatinine Ratio, Urine; Future  Colon cancer screening -     Fecal occult blood, imunochemical([facility]/Sun[facility])     Follow Up Instructions No follow-ups on Marsh.     I discussed the assessment and treatment plan with the patient. The patient was provided an opportunity to ask questions and all were answered. The patient agreed with the plan and demonstrated an understanding of the instructions.   The patient was advised to call back or seek an in-person evaluation if the symptoms worsen or if the condition fails to improve as anticipated.  I provided *** minutes of non-face-to-face time during this encounter including median intraservice time, reviewing previous notes, labs, imaging, medications and explaining diagnosis and management.  Ronald Pounds, FNP-BC

## 2020-01-28 ENCOUNTER — Encounter: Payer: Self-pay | Admitting: Nurse Practitioner

## 2020-01-28 DIAGNOSIS — Z1211 Encounter for screening for malignant neoplasm of colon: Secondary | ICD-10-CM | POA: Diagnosis not present

## 2020-01-28 LAB — MICROALBUMIN / CREATININE URINE RATIO
Creatinine, Urine: 9.6 mg/dL
Microalbumin, Urine: <3 ug/mL

## 2020-01-28 LAB — CBC
Hematocrit: 38.8 % (ref 37.5–51.0)
Hemoglobin: 13.3 g/dL (ref 13.0–17.7)
MCH: 31.8 pg (ref 26.6–33.0)
MCHC: 34.3 g/dL (ref 31.5–35.7)
MCV: 93 fL (ref 79–97)
Platelets: 207 10*3/uL (ref 150–450)
RBC: 4.18 x10E6/uL (ref 4.14–5.80)
RDW: 13.8 % (ref 11.6–15.4)
WBC: 8.1 10*3/uL (ref 3.4–10.8)

## 2020-01-28 NOTE — Progress Notes (Signed)
Virtual Visit via Telephone Note Due to national recommendations of social distancing due to [place] 19, telehealth visit is felt to be most appropriate for this patient at this time.  I discussed the limitations, risks, security and privacy concerns of performing an evaluation and management service by telephone and the availability of in person appointments. I also discussed with the patient that there may be a patient responsible charge related to this service. The patient expressed understanding and agreed to proceed.    I connected with Kenn File on 01/28/20  at   1:30 PM EDT  EDT by telephone and verified that I am speaking with the correct person using two identifiers.   Consent I discussed the limitations, risks, security and privacy concerns of performing an evaluation and management service by telephone and the availability of in person appointments. I also discussed with the patient that there may be a patient responsible charge related to this service. The patient expressed understanding and agreed to proceed.   Location of Patient: Private Residence    Location of Provider: [place] [facility] [employer] Office    Persons participating in Telemedicine visit: Geryl Rankins FNP-BC [place]     History of Present Illness: Telemedicine visit for: F/u  has a past medical history of Diabetes mellitus without complication ([place]), Heart failure ([place]), Hepatitis C, History of CVA (cerebrovascular accident) (02/2010), History of depression, Hyperlipidemia, Hypertension, Internal carotid artery stenosis, Stroke ([place]) (2012), and Substance/Tobacco abuse.   Essential Hypertension Well controlled. He does monitor his blood pressure at home but can not recall any readings at this time. Denies chest pain, shortness of breath, palpitations,  headaches or BLE edema. He does endorse dizziness with taking the increased dosage of carvedilol. Encouraged to make slower  movements in regards to position of body and head. If continues will need to likely reduce carvedilol.  Currently taking amlodipine 10 mg daily, carvedilol 12.5 mg twice daily (Recently increased by Cardiology), BiDil 20-37.5 mg 2 tablets twice daily and spironolactone 25 mg daily as prescribed. He continues smoking and is ready to quit. Trying to cut back. Refraining from ETOH and illicit substance.  BP Readings from Last 3 Encounters:  01/06/20 136/78  12/02/19 (!) 135/69  10/23/19 125/66   DM TYPE 2 Well controlled.  He cannot recall any blood glucose readings from home but states they are all "good".  He is taking Metformin 500 mg twice daily as prescribed.  Denies any symptoms of hypo or hyperglycemia.  LDL is not at goal with high intensity statin Lipitor 80 mg daily. Lab Results  Component Value Date   HGBA1C 5.7 (A) 10/23/2019   Lab Results  Component Value Date   LDLCALC 118 (H) 10/23/2019    Past Medical History:  Diagnosis Date  . Diabetes mellitus without complication ([place])   . Heart failure ([place])   . Hepatitis C   . History of CVA (cerebrovascular accident) 02/2010   Left periventricular subcortical ischemic infarction // Residual RUE and RLE weakness  . History of depression    surrounding original stroke - has since resolved (04/2012)  . Hyperlipidemia   . Hypertension   . Internal carotid artery stenosis    BL and mild per CT angiogram (05/2010)  . Stroke ([place]) 2012  . Tobacco abuse     History reviewed. No pertinent surgical history.  Family History  Problem Relation Age of Onset  . Diabetes Mother   . Heart failure Mother   . Hypertension  Mother   . Hyperlipidemia Mother   . Glaucoma Mother   . Lung cancer Father   . Hypertension Brother   . Diabetes Brother   . Pancreatic cancer Brother   . Diabetes Sister   . Arthritis Sister     Social History   Socioeconomic History  . Marital status: Married    Spouse name: Not on file  . Number of children:  1  . Years of education: 11th grade  . Highest education level: Not on file  Occupational History  . Occupation: On disability    Comment: previously worked for [employer] until his stroke in 2011.   Tobacco Use  . Smoking status: Current Some Day Smoker    Packs/day: 0.30    Years: 31.00    Pack years: 9.30    Types: Cigarettes    Start date: 04/25/1981  . Smokeless tobacco: Never Used  . Tobacco comment: up to 3 cigs/day; cutting back.  Vaping Use  . Vaping Use: Never used  Substance and Sexual Activity  . Alcohol use: Not Currently    Comment: occasional  . Drug use: No    Comment: Former smoke crack   . Sexual activity: Yes  Other Topics Concern  . Not on file  Social History Narrative   Lives in [place] with wife.   Has been incarcerated multiple times, last time was in 2012 for 8 months.   Social Determinants of Health   Financial Resource Strain:   . Difficulty of Paying Living Expenses: Not on file  Food Insecurity:   . Worried About Charity fundraiser in the Last Year: Not on file  . Ran Out of Food in the Last Year: Not on file  Transportation Needs:   . Lack of Transportation (Medical): Not on file  . Lack of Transportation (Non-Medical): Not on file  Physical Activity:   . Days of Exercise per Week: Not on file  . Minutes of Exercise per Session: Not on file  Stress:   . Feeling of Stress : Not on file  Social Connections:   . Frequency of Communication with Friends and Family: Not on file  . Frequency of Social Gatherings with Friends and Family: Not on file  . Attends Religious Services: Not on file  . Active Member of Clubs or Organizations: Not on file  . Attends Archivist Meetings: Not on file  . Marital Status: Not on file     Observations/Objective: Awake, alert and oriented x 3   Review of Systems  Constitutional: Negative for fever, malaise/fatigue and weight loss.  HENT: Negative.  Negative for nosebleeds.   Eyes:  Negative.  Negative for blurred vision, double vision and photophobia.  Respiratory: Negative.  Negative for cough and shortness of breath.   Cardiovascular: Negative.  Negative for chest pain, palpitations and leg swelling.  Gastrointestinal: Negative.  Negative for heartburn, nausea and vomiting.  Musculoskeletal: Negative.  Negative for myalgias.  Neurological: Positive for dizziness (encouraged to avoid sudden movements). Negative for focal weakness, seizures and headaches.  Psychiatric/Behavioral: Negative.  Negative for suicidal ideas.    Assessment and Plan: [place] was seen today for follow-up.  Diagnoses and all orders for this visit:  Controlled type 2 diabetes mellitus with diabetic polyneuropathy, without long-term current use of insulin (HCC) -     Microalbumin/Creatinine Ratio, Urine; Future -     Accu-Chek FastClix Lancets MISC; Use as instructed. Inject into the skin once daily. E11.9 -  metFORMIN (GLUCOPHAGE) 500 MG tablet; Take 1 tablet (500 mg total) by mouth 2 (two) times daily with a meal. -     Microalbumin/Creatinine Ratio, Urine Continue blood sugar control as discussed in office today, low carbohydrate diet, and regular physical exercise as tolerated, 150 minutes per week (30 min each day, 5 days per week, or 50 min 3 days per week). Keep blood sugar logs with fasting goal of 90-130 mg/dl, post prandial (after you eat) less than 180.  For Hypoglycemia: BS <60 and Hyperglycemia BS >400; contact the clinic ASAP. Annual eye exams and foot exams are recommended.  Colon cancer screening -     Fecal occult blood, imunochemical([facility])  Pure hypercholesterolemia -     atorvastatin (LIPITOR) 80 MG tablet; Take 1 tablet (80 mg total) by mouth daily.  Essential hypertension -     amLODipine (NORVASC) 10 MG tablet; TAKE 1 TABLET(10 MG) BY MOUTH DAILY Continue all antihypertensives as prescribed.  Remember to bring in your blood pressure log with you for your  follow up appointment.  DASH/Mediterranean Diets are healthier choices for HTN.    Acute on chronic diastolic CHF (congestive heart failure) (HCC) -     carvedilol (COREG) 12.5 MG tablet; Take 1 tablet (12.5 mg total) by mouth 2 (two) times daily with a meal. -     spironolactone (ALDACTONE) 25 MG tablet; TAKE 1 TABLET(25 MG) BY MOUTH DAILY -     isosorbide-hydrALAZINE (BIDIL) 20-37.5 MG tablet; Take 2 tablets by mouth in the morning and at bedtime. Continue follow-up appointments with Dr. Clayborne Dana office as instructed DASH DIET  History of CVA (cerebrovascular accident) -     clopidogrel (PLAVIX) 75 MG tablet; Take 1 tablet (75 mg total) by mouth daily. -     CBC NO residual deficits .        Follow Up Instructions Return in about 3 months (around 04/27/2020).     I discussed the assessment and treatment plan with the patient. The patient was provided an opportunity to ask questions and all were answered. The patient agreed with the plan and demonstrated an understanding of the instructions.   The patient was advised to call back or seek an in-person evaluation if the symptoms worsen or if the condition fails to improve as anticipated.  I provided 20 minutes of non-face-to-face time during this encounter including median intraservice time, reviewing previous notes, labs, imaging, medications and explaining diagnosis and management.  [name], FNP-BC

## 2020-01-30 LAB — FECAL OCCULT BLOOD, IMMUNOCHEMICAL: Fecal Occult Bld: NEGATIVE

## 2020-02-06 ENCOUNTER — Telehealth: Payer: Self-pay

## 2020-02-06 NOTE — Telephone Encounter (Signed)
Pt aware of results and voiced understanding, all questions answered/concerns addressed.  

## 2020-04-27 ENCOUNTER — Ambulatory Visit: Payer: Medicaid Other | Admitting: Nurse Practitioner

## 2020-05-15 ENCOUNTER — Other Ambulatory Visit: Payer: Self-pay

## 2020-05-15 ENCOUNTER — Other Ambulatory Visit: Payer: Self-pay | Admitting: *Deleted

## 2020-05-15 NOTE — Patient Outreach (Signed)
Care Coordination  05/15/2020  Ronald Marsh [birth year] [id number]   Masayuki Sakai was referred to the [facility] Managed Care High Risk team for assistance with care coordination and care management services. Care coordination/care management services as part of the Medicaid benefit was offered to the patient today. The patient declined assistance offered today. Ronald Marsh did request contact information for [facility]. This will be sent by mychart as he agreed for mychart sign up to be sent to his cell phone.   Plan: The Medicaid Managed Care High Risk team is available at any time in the future to assist with care coordination/care management services upon referral.   Lurena Joiner RN, BSN [place] RN Care Coordinator

## 2020-05-15 NOTE — Patient Instructions (Signed)
Mr Ronald Marsh,  Thank you for taking time to speak with me today about care coordination and care management services available to you at no cost as part of your Medicaid benefit. These services are voluntary. Our team is available to provide assistance regarding your health care needs at any time. Please do not hesitate to reach out to me if we can be of service to you at any time in the future. [phone]  As requested the contact information for [facility]  Lurena Joiner RN, BSN [place]  [employer] manager

## 2020-05-20 ENCOUNTER — Encounter: Payer: Self-pay | Admitting: Physician Assistant

## 2020-05-20 ENCOUNTER — Other Ambulatory Visit: Payer: Self-pay

## 2020-05-20 ENCOUNTER — Ambulatory Visit: Payer: Medicaid Other | Attending: Physician Assistant | Admitting: Physician Assistant

## 2020-05-20 VITALS — BP 126/76 | HR 75 | Temp 97.8°F | Resp 16 | Ht 74.5 in | Wt 196.2 lb

## 2020-05-20 DIAGNOSIS — Z8673 Personal history of transient ischemic attack (TIA), and cerebral infarction without residual deficits: Secondary | ICD-10-CM | POA: Diagnosis not present

## 2020-05-20 DIAGNOSIS — I5033 Acute on chronic diastolic (congestive) heart failure: Secondary | ICD-10-CM

## 2020-05-20 DIAGNOSIS — M5416 Radiculopathy, lumbar region: Secondary | ICD-10-CM | POA: Diagnosis not present

## 2020-05-20 DIAGNOSIS — I10 Essential (primary) hypertension: Secondary | ICD-10-CM

## 2020-05-20 DIAGNOSIS — E7800 Pure hypercholesterolemia, unspecified: Secondary | ICD-10-CM

## 2020-05-20 DIAGNOSIS — E1142 Type 2 diabetes mellitus with diabetic polyneuropathy: Secondary | ICD-10-CM | POA: Diagnosis not present

## 2020-05-20 DIAGNOSIS — M65312 Trigger thumb, left thumb: Secondary | ICD-10-CM

## 2020-05-20 LAB — POCT GLYCOSYLATED HEMOGLOBIN (HGB A1C): HbA1c, POC (controlled diabetic range): 6 % (ref 0.0–7.0)

## 2020-05-20 LAB — GLUCOSE, POCT (MANUAL RESULT ENTRY): POC Glucose: 157 mg/dl — AB (ref 70–99)

## 2020-05-20 MED ORDER — CLOPIDOGREL BISULFATE 75 MG PO TABS: 75.0000 mg | ORAL_TABLET | Freq: Every day | ORAL | 1 refills | Status: DC

## 2020-05-20 MED ORDER — CARVEDILOL 12.5 MG PO TABS: 12.5000 mg | ORAL_TABLET | Freq: Two times a day (BID) | ORAL | 6 refills | Status: DC

## 2020-05-20 MED ORDER — METFORMIN HCL 500 MG PO TABS: 500.0000 mg | ORAL_TABLET | Freq: Two times a day (BID) | ORAL | 1 refills | Status: DC

## 2020-05-20 MED ORDER — SPIRONOLACTONE 25 MG PO TABS: ORAL_TABLET | ORAL | 1 refills | Status: DC

## 2020-05-20 MED ORDER — AMLODIPINE BESYLATE 10 MG PO TABS: ORAL_TABLET | ORAL | 1 refills | Status: DC

## 2020-05-20 MED ORDER — ATORVASTATIN CALCIUM 80 MG PO TABS: 80.0000 mg | ORAL_TABLET | Freq: Every day | ORAL | 2 refills | Status: DC

## 2020-05-20 MED ORDER — METFORMIN HCL 500 MG PO TABS: ORAL_TABLET | ORAL | 1 refills | Status: DC

## 2020-05-20 MED ORDER — TRAMADOL HCL 50 MG PO TABS: 50.0000 mg | ORAL_TABLET | Freq: Three times a day (TID) | ORAL | 0 refills | Status: AC | PRN

## 2020-05-20 NOTE — Progress Notes (Signed)
Ronald Marsh, is a 60 y.o. male  PZW:[id number]  [id number]  DOB - Jul 13, 1960  Subjective:  Chief Complaint and HPI: Ronald Marsh is a 60 y.o. male here today for med RF, L thumb pain, and needs tramadol for lumbar radiculopathy/R leg pain since stroke.  Compliant with all meds.  Does not check blood sugars.  NKI to L thumb.  Pops and clicks at times.   ROS:   Constitutional:  No f/c, No night sweats, No unexplained weight loss. EENT:  No vision changes, No blurry vision, No hearing changes. No mouth, throat, or ear problems.  Respiratory: No cough, No SOB Cardiac: No CP, no palpitations GI:  No abd pain, No N/V/D. GU: No Urinary s/sx Musculoskeletal: L thumb oain, R leg pain Neuro: No headache, no dizziness, no motor weakness.  Skin: No rash Endocrine:  No polydipsia. No polyuria.  Psych: Denies SI/HI  No problems updated.  ALLERGIES: Allergies  Allergen Reactions  . Benicar [Olmesartan] Anaphylaxis and Swelling    Patient cannot name the site of swelling (??)  . Ace Inhibitors Swelling    Patient cannot name the site of swelling (??)  . Angiotensin Receptor Blockers Swelling    Patient cannot name the site of swelling (??)    PAST MEDICAL HISTORY: Past Medical History:  Diagnosis Date  . Diabetes mellitus without complication ([place])   . Heart failure ([place])   . Hepatitis C   . History of CVA (cerebrovascular accident) 02/2010   Left periventricular subcortical ischemic infarction // Residual RUE and RLE weakness  . History of depression    surrounding original stroke - has since resolved (04/2012)  . Hyperlipidemia   . Hypertension   . Internal carotid artery stenosis    BL and mild per CT angiogram (05/2010)  . Stroke ([place]) 2012  . Tobacco abuse     MEDICATIONS AT HOME: Prior to Admission medications   Medication Sig Start Date End Date Taking? Authorizing Provider  traMADol (ULTRAM) 50 MG tablet Take 1 tablet (50 mg total) by mouth every 8  (eight) hours as needed for up to 5 days. 05/20/20 05/25/20 Yes Nahjae Hoeg, Dionne Bucy, PA-C  Accu-Chek FastClix Lancets MISC Use as instructed. Inject into the skin once daily. E11.9 01/27/20   [name], NP  acetaminophen (TYLENOL) 500 MG tablet Take 1 tablet (500 mg total) by mouth every 6 (six) hours as needed for moderate pain. 09/09/19   Corena Herter, PA-C  amLODipine (NORVASC) 10 MG tablet TAKE 1 TABLET(10 MG) BY MOUTH DAILY 05/20/20   Argentina Donovan, PA-C  aspirin 81 MG tablet Take 1 tablet (81 mg total) by mouth daily. 06/12/18   Elsie Stain, MD  atorvastatin (LIPITOR) 80 MG tablet Take 1 tablet (80 mg total) by mouth daily. 05/20/20 08/18/20  Argentina Donovan, PA-C  Blood Glucose Calibration (ACCU-CHEK GUIDE CONTROL) LIQD 1 each by In Vitro route once as needed for up to 1 dose. E11.9 10/05/18   [name], NP  Blood Pressure Monitor DEVI Please provide patient with insurance approved blood pressure monitor 10/14/18   [name], NP  carvedilol (COREG) 12.5 MG tablet Take 1 tablet (12.5 mg total) by mouth 2 (two) times daily with a meal. 05/20/20   Everlena Mackley, Dionne Bucy, PA-C  clopidogrel (PLAVIX) 75 MG tablet Take 1 tablet (75 mg total) by mouth daily. 05/20/20   Argentina Donovan, PA-C  glucose blood (ACCU-CHEK GUIDE) test strip Use as instructed. Check blood glucose  by fingerstick once per day.  E11.9 03/12/19   [name], NP  metFORMIN (GLUCOPHAGE) 500 MG tablet 2 with breakfast and 1 with dinner 05/20/20   Argentina Donovan, PA-C  neomycin-polymyxin-hydrocortisone (CORTISPORIN) OTIC solution Apply 2-3 drops to the ingrown toenail site twice daily. Cover with band-aid. 04/22/19   [name], DPM  spironolactone (ALDACTONE) 25 MG tablet TAKE 1 TABLET(25 MG) BY MOUTH DAILY 05/20/20   Argentina Donovan, PA-C     Objective:  EXAM:   Vitals:   05/20/20 1115  BP: 126/76  Pulse: 75  Resp: 16  Temp: 97.8 F (36.6 C)  SpO2: 97%  Weight: 196 lb 3.2 oz (89 kg)   Height: 6' 2.5" (1.892 m)    General appearance : A&OX3. NAD. Non-toxic-appearing HEENT: Atraumatic and Normocephalic.  PERRLA. EOM intact.  Neck: supple, no JVD. No cervical lymphadenopathy. No thyromegaly Chest/Lungs:  Breathing-non-labored, Good air entry bilaterally, breath sounds normal without rales, rhonchi, or wheezing  CVS: S1 S2 regular, no murmurs, gallops, rubs  L thumb with normal color/pallor, no swelling, locks/clicks pos at DIP with movement Extremities: Bilateral Lower Ext shows no edema, both legs are warm to touch with = pulse throughout Neurology:  CN II-XII grossly intact, Non focal.   Psych:  TP linear. J/I WNL. Normal speech. Appropriate eye contact and affect.  Skin:  No Rash  Data Review Lab Results  Component Value Date   HGBA1C 6.0 05/20/2020   HGBA1C 5.7 (A) 10/23/2019   HGBA1C 6.0 (A) 12/07/2018     Assessment & Plan   1. Controlled type 2 diabetes mellitus with diabetic polyneuropathy, without long-term current use of insulin (HCC) A1C has gone up.  Discussed proper diet at length.  Increase dose metformin 2 in the morning 1 at night.   - POCT glucose (manual entry) - POCT glycosylated hemoglobin (Hb A1C) - Comprehensive metabolic panel - metFORMIN (GLUCOPHAGE) 500 MG tablet; 2 with breakfast and 1 with dinner  Dispense: 270 tablet; Refill: 1  2. Pure hypercholesterolemia - Comprehensive metabolic panel - Lipid panel - atorvastatin (LIPITOR) 80 MG tablet; Take 1 tablet (80 mg total) by mouth daily.  Dispense: 90 tablet; Refill: 2  3. Acute on chronic diastolic CHF (congestive heart failure) (HCC) - carvedilol (COREG) 12.5 MG tablet; Take 1 tablet (12.5 mg total) by mouth 2 (two) times daily with a meal.  Dispense: 60 tablet; Refill: 6 - spironolactone (ALDACTONE) 25 MG tablet; TAKE 1 TABLET(25 MG) BY MOUTH DAILY  Dispense: 90 tablet; Refill: 1  4. Essential hypertension controlled - Comprehensive metabolic panel - amLODipine (NORVASC) 10 MG  tablet; TAKE 1 TABLET(10 MG) BY MOUTH DAILY  Dispense: 90 tablet; Refill: 1  5. History of CVA (cerebrovascular accident) - clopidogrel (PLAVIX) 75 MG tablet; Take 1 tablet (75 mg total) by mouth daily.  Dispense: 90 tablet; Refill: 1  6. Lumbar radiculopathy Tramadol sent  7. Trigger finger of left thumb Tramadol sent. - Ambulatory referral to Hand Surgery     Patient have been counseled extensively about nutrition and exercise  Return in about 3 months (around 08/18/2020) for see PCP;  chronic conditions.  The patient was given clear instructions to go to ER or return to medical center if symptoms don't improve, worsen or new problems develop. The patient verbalized understanding. The patient was told to call to get lab results if they haven't heard anything in the next week.     Freeman Caldron, PA-C [place] [facility]  [place], [place]   05/20/2020, 11:30 AMPatient ID: Ronald Marsh, male   DOB: Nov 24, 1960, 60 y.o.   [name]: [id number]

## 2020-05-20 NOTE — Progress Notes (Signed)
Pt is requesting Tramadol  Pt states his thumb on his left hand keeps popping

## 2020-05-21 LAB — LIPID PANEL
Chol/HDL Ratio: 2.2 ratio (ref 0.0–5.0)
Cholesterol, Total: 130 mg/dL (ref 100–199)
HDL: 59 mg/dL (ref >39)
LDL Chol Calc (NIH): 59 mg/dL (ref 0–99)
Triglycerides: 54 mg/dL (ref 0–149)
VLDL Cholesterol Cal: 12 mg/dL (ref 5–40)

## 2020-05-21 LAB — COMPREHENSIVE METABOLIC PANEL
ALT: 14 IU/L (ref 0–44)
AST: 20 IU/L (ref 0–40)
Albumin/Globulin Ratio: 1.5 (ref 1.2–2.2)
Albumin: 4.6 g/dL (ref 3.8–4.9)
Alkaline Phosphatase: 78 IU/L (ref 44–121)
BUN/Creatinine Ratio: 14 (ref 9–20)
BUN: 11 mg/dL (ref 6–24)
Bilirubin Total: 0.5 mg/dL (ref 0.0–1.2)
CO2: 21 mmol/L (ref 20–29)
Calcium: 9.8 mg/dL (ref 8.7–10.2)
Chloride: 101 mmol/L (ref 96–106)
Creatinine, Ser: 0.81 mg/dL (ref 0.76–1.27)
GFR calc Af Amer: 114 mL/min/{1.73_m2} (ref >59)
GFR calc non Af Amer: 99 mL/min/{1.73_m2} (ref >59)
Globulin, Total: 3.1 g/dL (ref 1.5–4.5)
Glucose: 132 mg/dL — ABNORMAL HIGH (ref 65–99)
Potassium: 5.1 mmol/L (ref 3.5–5.2)
Sodium: 137 mmol/L (ref 134–144)
Total Protein: 7.7 g/dL (ref 6.0–8.5)

## 2020-05-22 ENCOUNTER — Telehealth (INDEPENDENT_AMBULATORY_CARE_PROVIDER_SITE_OTHER): Payer: Self-pay

## 2020-05-22 NOTE — Telephone Encounter (Signed)
Connected with patient. He verified date of birth. He is aware that other than blood sugar being a little elevated all other labs were normal. He verbalized understanding of results. Nat Christen, CMA

## 2020-05-22 NOTE — Telephone Encounter (Signed)
-----   Message from Argentina Donovan, [place] sent at 05/21/2020 12:23 PM EST ----- Other than blood sugar being a little elevated, other labs are normal including kidney function, electrolytes, cholesterol, and liver function.  Follow up as planned.  Thanks, Freeman Caldron, PA-C

## 2020-06-16 ENCOUNTER — Encounter: Payer: Self-pay | Admitting: Orthopaedic Surgery

## 2020-06-16 ENCOUNTER — Ambulatory Visit (INDEPENDENT_AMBULATORY_CARE_PROVIDER_SITE_OTHER): Payer: Medicaid Other | Admitting: Orthopaedic Surgery

## 2020-06-16 ENCOUNTER — Other Ambulatory Visit: Payer: Self-pay

## 2020-06-16 DIAGNOSIS — M65312 Trigger thumb, left thumb: Secondary | ICD-10-CM | POA: Diagnosis not present

## 2020-06-16 MED ORDER — METHYLPREDNISOLONE ACETATE 40 MG/ML IJ SUSP
13.3300 mg | INTRAMUSCULAR | Status: AC | PRN
  Administered 2020-06-16: 13.33 mg

## 2020-06-16 MED ORDER — LIDOCAINE HCL 1 % IJ SOLN
1.0000 mL | INTRAMUSCULAR | Status: AC | PRN
  Administered 2020-06-16: 1 mL

## 2020-06-16 MED ORDER — BUPIVACAINE HCL 0.25 % IJ SOLN
0.3300 mL | INTRAMUSCULAR | Status: AC | PRN
  Administered 2020-06-16: .33 mL

## 2020-06-16 NOTE — Progress Notes (Signed)
Office Visit Note   Patient: Ronald Marsh           Date of Birth: [birth year]           [name]: [id number] Visit Date: 06/16/2020              Requested by: Argentina Donovan, PA-C [place],  [place] [address] PCP: [name], NP   Assessment & Plan: Visit Diagnoses:  1. Trigger finger of left thumb     Plan: Impression is left trigger thumb.  We have discussed treatment options to include cortisone injection and nighttime splinting for the next week.  He would like to proceed with both.  We will follow up with [place] as needed.  Follow-Up Instructions: Return if symptoms worsen or fail to improve.   Orders:  Orders Placed This Encounter  Procedures  . Hand/UE Inj: L thumb A1   No orders of the defined types were placed in this encounter.     Procedures: Hand/UE Inj: L thumb A1 for trigger finger on 06/16/2020 1:46 PM Indications: pain Details: 25 G needle Medications: 1 mL lidocaine 1 %; 0.33 mL bupivacaine 0.25 %; 13.33 mg methylPREDNISolone acetate 40 MG/ML      Clinical Data: No additional findings.   Subjective: Chief Complaint  Patient presents with  . Left Thumb - Pain    HPI patient is a pleasant 60 year old right-hand-dominant gentleman who comes in today with pain and triggering to the left thumb.  He noticed this about a month ago.  He notes his symptoms are worse at night.  No history of previous trigger finger.  He is a diabetic.  Review of Systems as detailed in HPI.  All others reviewed and are negative.   Objective: Vital Signs: There were no vitals taken for this visit.  Physical Exam well-developed well-nourished gentleman in no acute distress.  Alert and oriented x3.  Ortho Exam left hand exam shows moderate tenderness at the A1 pulley of the thumb.  He does have reproducible triggering.  Negative grind test.  No tenderness to the first [facility] joint.  He is neurovascular intact distally.  Specialty Comments:  No specialty  comments available.  Imaging: No new imaging   PMFS History: Patient Active Problem List   Diagnosis Date Noted  . Hammer toes of both feet 03/22/2019  . Coagulation disorder ([place]) 03/22/2019  . Pain due to onychomycosis of toenails of both feet 12/11/2018  . Chronic hepatitis C without hepatic coma ([place]) 06/13/2018  . Toenail fungus 06/12/2018  . Bilateral bunions 06/12/2018  . Acute on chronic diastolic CHF (congestive heart failure) ([place]) 05/06/2018  . Cardiomyopathy ([place]) 01/31/2018  . History of substance abuse ([place]) 02/19/2016  . Lumbar radiculopathy 10/01/2015  . Diabetes mellitus type 2, controlled ([place]) 07/03/2012  . Tobacco abuse   . Hypertension   . Hyperlipidemia   . History of CVA (cerebrovascular accident) 02/06/2010   Past Medical History:  Diagnosis Date  . Diabetes mellitus without complication ([place])   . Heart failure ([place])   . Hepatitis C   . History of CVA (cerebrovascular accident) 02/2010   Left periventricular subcortical ischemic infarction // Residual RUE and RLE weakness  . History of depression    surrounding original stroke - has since resolved (04/2012)  . Hyperlipidemia   . Hypertension   . Internal carotid artery stenosis    BL and mild per CT angiogram (05/2010)  . Stroke ([place]) 2012  . Tobacco abuse  Family History  Problem Relation Age of Onset  . Diabetes Mother   . Heart failure Mother   . Hypertension Mother   . Hyperlipidemia Mother   . Glaucoma Mother   . Lung cancer Father   . Hypertension Brother   . Diabetes Brother   . Pancreatic cancer Brother   . Diabetes Sister   . Arthritis Sister     History reviewed. No pertinent surgical history. Social History   Occupational History  . Occupation: On disability    Comment: previously worked for [employer] until his stroke in 2011.   Tobacco Use  . Smoking status: Current Some Day Smoker    Packs/day: 0.30    Years: 31.00    Pack years: 9.30    Types: Cigarettes     Start date: 04/25/1981  . Smokeless tobacco: Never Used  . Tobacco comment: up to 3 cigs/day; cutting back.  Vaping Use  . Vaping Use: Never used  Substance and Sexual Activity  . Alcohol use: Not Currently    Comment: occasional  . Drug use: No    Comment: Former smoke crack   . Sexual activity: Yes

## 2020-08-18 ENCOUNTER — Ambulatory Visit: Payer: Medicaid Other | Admitting: Nurse Practitioner

## 2020-10-22 ENCOUNTER — Other Ambulatory Visit: Payer: Self-pay | Admitting: Nurse Practitioner

## 2020-10-22 DIAGNOSIS — E1142 Type 2 diabetes mellitus with diabetic polyneuropathy: Secondary | ICD-10-CM

## 2020-10-22 MED ORDER — METFORMIN HCL 500 MG PO TABS: ORAL_TABLET | ORAL | 0 refills | Status: DC

## 2020-10-22 NOTE — Telephone Encounter (Signed)
Medication Refill - Medication:    metFORMIN (GLUCOPHAGE) 500 MG tablet   Has the patient contacted their pharmacy? Yes.   No refills left, contact pcp.   Preferred Pharmacy (with phone number or street name):   [facility] [place], [place]  [place] [address]  Phone: [phone] Fax: [phone]    Agent: Please be advised that RX refills may take up to 3 business days. We ask that you follow-up with your pharmacy.

## 2020-12-14 ENCOUNTER — Ambulatory Visit: Payer: Medicaid Other | Attending: Nurse Practitioner | Admitting: Nurse Practitioner

## 2020-12-14 ENCOUNTER — Encounter: Payer: Self-pay | Admitting: Nurse Practitioner

## 2020-12-14 ENCOUNTER — Other Ambulatory Visit: Payer: Self-pay

## 2020-12-14 VITALS — BP 143/82 | HR 69 | Ht 74.5 in | Wt 194.0 lb

## 2020-12-14 DIAGNOSIS — E1140 Type 2 diabetes mellitus with diabetic neuropathy, unspecified: Secondary | ICD-10-CM | POA: Diagnosis not present

## 2020-12-14 DIAGNOSIS — E1142 Type 2 diabetes mellitus with diabetic polyneuropathy: Secondary | ICD-10-CM

## 2020-12-14 DIAGNOSIS — Z8249 Family history of ischemic heart disease and other diseases of the circulatory system: Secondary | ICD-10-CM | POA: Insufficient documentation

## 2020-12-14 DIAGNOSIS — Z760 Encounter for issue of repeat prescription: Secondary | ICD-10-CM | POA: Diagnosis not present

## 2020-12-14 DIAGNOSIS — F17210 Nicotine dependence, cigarettes, uncomplicated: Secondary | ICD-10-CM | POA: Diagnosis not present

## 2020-12-14 DIAGNOSIS — Z7984 Long term (current) use of oral hypoglycemic drugs: Secondary | ICD-10-CM | POA: Diagnosis not present

## 2020-12-14 DIAGNOSIS — I110 Hypertensive heart disease with heart failure: Secondary | ICD-10-CM | POA: Insufficient documentation

## 2020-12-14 DIAGNOSIS — Z7982 Long term (current) use of aspirin: Secondary | ICD-10-CM | POA: Insufficient documentation

## 2020-12-14 DIAGNOSIS — D649 Anemia, unspecified: Secondary | ICD-10-CM

## 2020-12-14 DIAGNOSIS — Z833 Family history of diabetes mellitus: Secondary | ICD-10-CM | POA: Insufficient documentation

## 2020-12-14 DIAGNOSIS — Z888 Allergy status to other drugs, medicaments and biological substances status: Secondary | ICD-10-CM | POA: Diagnosis not present

## 2020-12-14 DIAGNOSIS — I5033 Acute on chronic diastolic (congestive) heart failure: Secondary | ICD-10-CM | POA: Diagnosis not present

## 2020-12-14 DIAGNOSIS — Z79899 Other long term (current) drug therapy: Secondary | ICD-10-CM | POA: Insufficient documentation

## 2020-12-14 DIAGNOSIS — I10 Essential (primary) hypertension: Secondary | ICD-10-CM

## 2020-12-14 DIAGNOSIS — Z8673 Personal history of transient ischemic attack (TIA), and cerebral infarction without residual deficits: Secondary | ICD-10-CM | POA: Insufficient documentation

## 2020-12-14 DIAGNOSIS — Z0289 Encounter for other administrative examinations: Secondary | ICD-10-CM

## 2020-12-14 DIAGNOSIS — E7800 Pure hypercholesterolemia, unspecified: Secondary | ICD-10-CM | POA: Diagnosis not present

## 2020-12-14 MED ORDER — CLOPIDOGREL BISULFATE 75 MG PO TABS: 75.0000 mg | ORAL_TABLET | Freq: Every day | ORAL | 1 refills | Status: DC

## 2020-12-14 MED ORDER — AMLODIPINE BESYLATE 10 MG PO TABS: ORAL_TABLET | ORAL | 1 refills | Status: DC

## 2020-12-14 MED ORDER — METFORMIN HCL 500 MG PO TABS: ORAL_TABLET | ORAL | 0 refills | Status: DC

## 2020-12-14 MED ORDER — TRAMADOL HCL 50 MG PO TABS: 50.0000 mg | ORAL_TABLET | Freq: Two times a day (BID) | ORAL | 0 refills | Status: AC | PRN

## 2020-12-14 MED ORDER — SPIRONOLACTONE 25 MG PO TABS: ORAL_TABLET | ORAL | 1 refills | Status: DC

## 2020-12-14 MED ORDER — ATORVASTATIN CALCIUM 80 MG PO TABS: 80.0000 mg | ORAL_TABLET | Freq: Every day | ORAL | 2 refills | Status: DC

## 2020-12-14 MED ORDER — CARVEDILOL 12.5 MG PO TABS: 12.5000 mg | ORAL_TABLET | Freq: Two times a day (BID) | ORAL | 6 refills | Status: DC

## 2020-12-14 NOTE — Progress Notes (Signed)
Assessment & Plan:  Schawn was seen today for hypertension.  Diagnoses and all orders for this visit:  Essential hypertension -     amLODipine (NORVASC) 10 MG tablet; TAKE 1 TABLET(10 MG) BY MOUTH DAILY -     CMP14+EGFR Continue all antihypertensives as prescribed.  Remember to bring in your blood pressure log with you for your follow up appointment.  DASH/Mediterranean Diets are healthier choices for HTN.    Pure hypercholesterolemia -     atorvastatin (LIPITOR) 80 MG tablet; Take 1 tablet (80 mg total) by mouth daily. -     Lipid panel INSTRUCTIONS: Work on a low fat, heart healthy diet and participate in regular aerobic exercise program by working out at least 150 minutes per week; 5 days a week-30 minutes per day. Avoid red meat/beef/steak,  fried foods. junk foods, sodas, sugary drinks, unhealthy snacking, alcohol and smoking.  Drink at least 80 oz of water per day and monitor your carbohydrate intake daily.     Acute on chronic diastolic CHF (congestive heart failure) (HCC) -     carvedilol (COREG) 12.5 MG tablet; Take 1 tablet (12.5 mg total) by mouth 2 (two) times daily with a meal. -     spironolactone (ALDACTONE) 25 MG tablet; TAKE 1 TABLET(25 MG) BY MOUTH DAILY DASH DIET  History of CVA (cerebrovascular accident) -     clopidogrel (PLAVIX) 75 MG tablet; Take 1 tablet (75 mg total) by mouth daily.  Controlled type 2 diabetes mellitus with diabetic polyneuropathy, without long-term current use of insulin (HCC) -     metFORMIN (GLUCOPHAGE) 500 MG tablet; 2 with breakfast and 1 with dinner Continue blood sugar control as discussed in office today, low carbohydrate diet, and regular physical exercise as tolerated, 150 minutes per week (30 min each day, 5 days per week, or 50 min 3 days per week). Keep blood sugar logs with fasting goal of 90-130 mg/dl, post prandial (after you eat) less than 180.  For Hypoglycemia: BS <60 and Hyperglycemia BS >400; contact the clinic  ASAP. Annual eye exams and foot exams are recommended.    Pain medication agreement -     Drug Screen 10 W/Conf, Se -     traMADol (ULTRAM) 50 MG tablet; Take 1 tablet (50 mg total) by mouth every 12 (twelve) hours as needed.  Anemia, unspecified type -     CBC   Patient has been counseled on age-appropriate routine health concerns for screening and prevention. These are reviewed and up-to-date. Referrals have been placed accordingly. Immunizations are up-to-date or declined.    Subjective:   Chief Complaint  Patient presents with   Hypertension   HPI Ronald Marsh 60 y.o. male presents to office today for follow up to HTN.  He has a past medical history of DM, Heart failure, Hepatitis C, History of CVA (02/2010), History of depression, Hyperlipidemia, Hypertension, Internal carotid artery stenosis, Stroke(2012), and Tobacco abuse.   History of Stroke Requesting a refill of tramadol he was previously taking. States he has intermittent right sided pain every since his stroke. Pain is aggravated by prolonged walking.    HTN Blood pressure is elevated. He states he smoked a cigarette prior to his office visit today. Currently endorses adherence taking amlodipine 10 mg daily, carvedilol 12.5 mg BID and spironolactone 25 mg daily. Denies chest pain, shortness of breath, palpitations, lightheadedness, dizziness, headaches or BLE edema.   BP Readings from Last 3 Encounters:  12/14/20 (!) 143/82  05/20/20 126/76  01/06/20 136/78     DM 2 Well controlled with metformin 1000 mg in the am and 500 mg in the pm.  LDL at goal with atorvastatin 80 mg daily. He denies any symptoms of hypoglycemia. He does endorse polyneuropathy.   Lab Results  Component Value Date   HGBA1C 6.0 05/20/2020   Lab Results  Component Value Date   LDLCALC 54 12/14/2020    Review of Systems  Constitutional:  Negative for fever, malaise/fatigue and weight loss.  HENT: Negative.  Negative for nosebleeds.    Eyes: Negative.  Negative for blurred vision, double vision and photophobia.  Respiratory: Negative.  Negative for cough and shortness of breath.   Cardiovascular: Negative.  Negative for chest pain, palpitations and leg swelling.  Gastrointestinal: Negative.  Negative for heartburn, nausea and vomiting.  Musculoskeletal: Negative.  Negative for myalgias.  Neurological: Negative.  Negative for dizziness, focal weakness, seizures and headaches.  Psychiatric/Behavioral: Negative.  Negative for suicidal ideas.  .  Past Medical History:  Diagnosis Date   Diabetes mellitus without complication ([place])    Heart failure ([place])    Hepatitis C    History of CVA (cerebrovascular accident) 02/2010   Left periventricular subcortical ischemic infarction // Residual RUE and RLE weakness   History of depression    surrounding original stroke - has since resolved (04/2012)   Hyperlipidemia    Hypertension    Internal carotid artery stenosis    BL and mild per CT angiogram (05/2010)   Stroke ([place]) 2012   Tobacco abuse     No past surgical history on file.  Family History  Problem Relation Age of Onset   Diabetes Mother    Heart failure Mother    Hypertension Mother    Hyperlipidemia Mother    Glaucoma Mother    Lung cancer Father    Hypertension Brother    Diabetes Brother    Pancreatic cancer Brother    Diabetes Sister    Arthritis Sister     Social History Reviewed with no changes to be made today.   Outpatient Medications Prior to Visit  Medication Sig Dispense Refill   Accu-Chek FastClix Lancets MISC Use as instructed. Inject into the skin once daily. E11.9 100 each 3   acetaminophen (TYLENOL) 500 MG tablet Take 1 tablet (500 mg total) by mouth every 6 (six) hours as needed for moderate pain. 30 tablet 0   aspirin 81 MG tablet Take 1 tablet (81 mg total) by mouth daily. 30 tablet 5   Blood Glucose Calibration (ACCU-CHEK GUIDE CONTROL) LIQD 1 each by In Vitro route once as needed  for up to 1 dose. E11.9 1 each 0   Blood Pressure Monitor DEVI Please provide patient with insurance approved blood pressure monitor 1 Device 0   glucose blood (ACCU-CHEK GUIDE) test strip Use as instructed. Check blood glucose by fingerstick once per day.  E11.9 100 each 12   neomycin-polymyxin-hydrocortisone (CORTISPORIN) OTIC solution Apply 2-3 drops to the ingrown toenail site twice daily. Cover with band-aid. 10 mL 0   amLODipine (NORVASC) 10 MG tablet TAKE 1 TABLET(10 MG) BY MOUTH DAILY 90 tablet 1   carvedilol (COREG) 12.5 MG tablet Take 1 tablet (12.5 mg total) by mouth 2 (two) times daily with a meal. 60 tablet 6   clopidogrel (PLAVIX) 75 MG tablet Take 1 tablet (75 mg total) by mouth daily. 90 tablet 1   metFORMIN (GLUCOPHAGE) 500 MG tablet 2 with breakfast and 1 with dinner 270 tablet 0  spironolactone (ALDACTONE) 25 MG tablet TAKE 1 TABLET(25 MG) BY MOUTH DAILY 90 tablet 1   atorvastatin (LIPITOR) 80 MG tablet Take 1 tablet (80 mg total) by mouth daily. 90 tablet 2   No facility-administered medications prior to visit.    Allergies  Allergen Reactions   Benicar [Olmesartan] Anaphylaxis and Swelling    Patient cannot name the site of swelling (??)   Ace Inhibitors Swelling    Patient cannot name the site of swelling (??)   Angiotensin Receptor Blockers Swelling    Patient cannot name the site of swelling (??)       Objective:    BP (!) 143/82   Pulse 69   Ht 6' 2.5" (1.892 m)   Wt 194 lb (88 kg)   SpO2 98%   BMI 24.57 kg/m  Wt Readings from Last 3 Encounters:  12/14/20 194 lb (88 kg)  05/20/20 196 lb 3.2 oz (89 kg)  01/06/20 187 lb (84.8 kg)    Physical Exam Vitals and nursing note reviewed.  Constitutional:      Appearance: He is well-developed.  HENT:     Head: Normocephalic and atraumatic.  Cardiovascular:     Rate and Rhythm: Normal rate and regular rhythm.     Heart sounds: Normal heart sounds. No murmur heard.   No friction rub. No gallop.   Pulmonary:     Effort: Pulmonary effort is normal. No tachypnea or respiratory distress.     Breath sounds: Normal breath sounds. No decreased breath sounds, wheezing, rhonchi or rales.  Chest:     Chest wall: No tenderness.  Abdominal:     General: Bowel sounds are normal.     Palpations: Abdomen is soft.  Musculoskeletal:        General: Normal range of motion.     Cervical back: Normal range of motion.  Skin:    General: Skin is warm and dry.  Neurological:     Mental Status: He is alert and oriented to person, place, and time.     Coordination: Coordination normal.  Psychiatric:        Behavior: Behavior normal. Behavior is cooperative.        Thought Content: Thought content normal.        Judgment: Judgment normal.         Patient has been counseled extensively about nutrition and exercise as well as the importance of adherence with medications and regular follow-up. The patient was given clear instructions to go to ER or return to medical center if symptoms don't improve, worsen or new problems develop. The patient verbalized understanding.   Follow-up: Return in about 3 months (around 03/16/2021).   [name], FNP-BC [facility] and [facility], [place]   12/15/2020, 8:51 PM

## 2020-12-14 NOTE — Progress Notes (Signed)
Wants to restart tramadol for hip pain.

## 2020-12-15 ENCOUNTER — Encounter: Payer: Self-pay | Admitting: Nurse Practitioner

## 2020-12-25 LAB — CMP14+EGFR
ALT: 10 IU/L (ref 0–44)
AST: 19 IU/L (ref 0–40)
Albumin/Globulin Ratio: 1.8 (ref 1.2–2.2)
Albumin: 4.7 g/dL (ref 3.8–4.9)
Alkaline Phosphatase: 53 IU/L (ref 44–121)
BUN/Creatinine Ratio: 17 (ref 9–20)
BUN: 14 mg/dL (ref 6–24)
Bilirubin Total: 0.3 mg/dL (ref 0.0–1.2)
CO2: 23 mmol/L (ref 20–29)
Calcium: 9.6 mg/dL (ref 8.7–10.2)
Chloride: 106 mmol/L (ref 96–106)
Creatinine, Ser: 0.82 mg/dL (ref 0.76–1.27)
Globulin, Total: 2.6 g/dL (ref 1.5–4.5)
Glucose: 111 mg/dL — ABNORMAL HIGH (ref 65–99)
Potassium: 3.8 mmol/L (ref 3.5–5.2)
Sodium: 144 mmol/L (ref 134–144)
Total Protein: 7.3 g/dL (ref 6.0–8.5)
eGFR: 102 mL/min/{1.73_m2} (ref >59)

## 2020-12-25 LAB — CBC
Hematocrit: 34.7 % — ABNORMAL LOW (ref 37.5–51.0)
Hemoglobin: 11.8 g/dL — ABNORMAL LOW (ref 13.0–17.7)
MCH: 31.8 pg (ref 26.6–33.0)
MCHC: 34 g/dL (ref 31.5–35.7)
MCV: 94 fL (ref 79–97)
Platelets: 175 10*3/uL (ref 150–450)
RBC: 3.71 x10E6/uL — ABNORMAL LOW (ref 4.14–5.80)
RDW: 13.1 % (ref 11.6–15.4)
WBC: 6.8 10*3/uL (ref 3.4–10.8)

## 2020-12-25 LAB — LIPID PANEL
Chol/HDL Ratio: 2.9 ratio (ref 0.0–5.0)
Cholesterol, Total: 100 mg/dL (ref 100–199)
HDL: 34 mg/dL — ABNORMAL LOW (ref >39)
LDL Chol Calc (NIH): 54 mg/dL (ref 0–99)
Triglycerides: 47 mg/dL (ref 0–149)
VLDL Cholesterol Cal: 12 mg/dL (ref 5–40)

## 2020-12-25 LAB — DRUG SCREEN 10 W/CONF, SERUM
Amphetamines, IA: NEGATIVE ng/mL
Barbiturates, IA: NEGATIVE ug/mL
Benzodiazepines, IA: NEGATIVE ng/mL
Cocaine & Metabolite, IA: NEGATIVE ng/mL
Methadone, IA: NEGATIVE ng/mL
Opiates, IA: NEGATIVE ng/mL
Oxycodones, IA: NEGATIVE ng/mL
Phencyclidine, IA: NEGATIVE ng/mL
Propoxyphene, IA: NEGATIVE ng/mL
THC(Marijuana) Metabolite, IA: POSITIVE ng/mL — AB

## 2020-12-25 LAB — THC,MS,WB/SP RFX
Cannabidiol: NEGATIVE ng/mL
Cannabinoid Confirmation: POSITIVE
Cannabinol: NEGATIVE ng/mL
Carboxy-THC: 14.6 ng/mL
Hydroxy-THC: NEGATIVE ng/mL
Tetrahydrocannabinol(THC): NEGATIVE ng/mL

## 2021-01-05 ENCOUNTER — Other Ambulatory Visit: Payer: Self-pay | Admitting: Nurse Practitioner

## 2021-01-05 NOTE — Telephone Encounter (Signed)
Requested medication (s) are due for refill today: Yes  Requested medication (s) are on the active medication list: No  Last refill:  01/27/20  Future visit scheduled: Yes  Notes to clinic:  Unable to refill per protocol, Rx expired. Medication not on current list, unsure if patient is still taking, routing to provider for approval/denial     Requested Prescriptions  Pending Prescriptions Disp Refills   BIDIL 20-37.5 MG tablet [Pharmacy Med Name: BIDIL TABLETS] 120 tablet 2    Sig: TAKE 2 TABLETS BY MOUTH IN THE MORNING AND AT BEDTIME     Cardiovascular:  Vasodilators Failed - 01/05/2021 11:37 AM      Failed - HCT in normal range and within 360 days    Hematocrit  Date Value Ref Range Status  12/14/2020 34.7 (L) 37.5 - 51.0 % Final          Failed - HGB in normal range and within 360 days    Hemoglobin  Date Value Ref Range Status  12/14/2020 11.8 (L) 13.0 - 17.7 g/dL Final          Failed - RBC in normal range and within 360 days    RBC  Date Value Ref Range Status  12/14/2020 3.71 (L) 4.14 - 5.80 x10E6/uL Final  07/03/2019 4.20 (L) 4.22 - 5.81 MIL/uL Final          Failed - Last BP in normal range    BP Readings from Last 1 Encounters:  12/14/20 (!) 143/82          Passed - WBC in normal range and within 360 days    WBC  Date Value Ref Range Status  12/14/2020 6.8 3.4 - 10.8 x10E3/uL Final  07/03/2019 5.8 4.0 - 10.5 K/uL Final          Passed - PLT in normal range and within 360 days    Platelets  Date Value Ref Range Status  12/14/2020 175 150 - 450 x10E3/uL Final          Passed - Valid encounter within last 12 months    Recent Outpatient Visits           3 weeks ago Essential hypertension   [place], [place] W, NP   7 months ago Controlled type 2 diabetes mellitus with diabetic polyneuropathy, without long-term current use of insulin [facility])   [place], Levada Dy M, [place]    11 months ago Essential hypertension   [place], [place] W, NP   1 year ago Controlled type 2 diabetes mellitus with diabetic polyneuropathy, without long-term current use of insulin [facility])   [place], [place], [place]   1 year ago Controlled type 2 diabetes mellitus with diabetic polyneuropathy, without long-term current use of insulin [facility])   [place], Vernia Buff, NP       Future Appointments             In 2 months [name], NP [place]

## 2021-01-08 ENCOUNTER — Encounter (HOSPITAL_COMMUNITY): Payer: Self-pay | Admitting: Internal Medicine

## 2021-01-08 ENCOUNTER — Ambulatory Visit (HOSPITAL_COMMUNITY)
Admission: RE | Admit: 2021-01-08 | Discharge: 2021-01-08 | Disposition: A | Discharge status: Home / Self Care | Payer: Medicaid Other | Source: Ambulatory Visit | Attending: Internal Medicine | Admitting: Internal Medicine

## 2021-01-08 ENCOUNTER — Telehealth (HOSPITAL_COMMUNITY): Payer: Self-pay | Admitting: Pharmacy Technician

## 2021-01-08 ENCOUNTER — Other Ambulatory Visit (HOSPITAL_COMMUNITY): Payer: Self-pay

## 2021-01-08 ENCOUNTER — Other Ambulatory Visit: Payer: Self-pay

## 2021-01-08 VITALS — BP 120/78 | HR 71 | Wt 189.4 lb

## 2021-01-08 DIAGNOSIS — Z7901 Long term (current) use of anticoagulants: Secondary | ICD-10-CM | POA: Insufficient documentation

## 2021-01-08 DIAGNOSIS — I5022 Chronic systolic (congestive) heart failure: Secondary | ICD-10-CM | POA: Diagnosis not present

## 2021-01-08 DIAGNOSIS — Z7902 Long term (current) use of antithrombotics/antiplatelets: Secondary | ICD-10-CM | POA: Insufficient documentation

## 2021-01-08 DIAGNOSIS — Z09 Encounter for follow-up examination after completed treatment for conditions other than malignant neoplasm: Secondary | ICD-10-CM | POA: Insufficient documentation

## 2021-01-08 DIAGNOSIS — Z720 Tobacco use: Secondary | ICD-10-CM

## 2021-01-08 DIAGNOSIS — I110 Hypertensive heart disease with heart failure: Secondary | ICD-10-CM | POA: Diagnosis not present

## 2021-01-08 DIAGNOSIS — I428 Other cardiomyopathies: Secondary | ICD-10-CM | POA: Insufficient documentation

## 2021-01-08 DIAGNOSIS — Z7984 Long term (current) use of oral hypoglycemic drugs: Secondary | ICD-10-CM | POA: Diagnosis not present

## 2021-01-08 DIAGNOSIS — Z7982 Long term (current) use of aspirin: Secondary | ICD-10-CM | POA: Diagnosis not present

## 2021-01-08 DIAGNOSIS — I10 Essential (primary) hypertension: Secondary | ICD-10-CM

## 2021-01-08 DIAGNOSIS — Z8249 Family history of ischemic heart disease and other diseases of the circulatory system: Secondary | ICD-10-CM | POA: Insufficient documentation

## 2021-01-08 DIAGNOSIS — Z79899 Other long term (current) drug therapy: Secondary | ICD-10-CM | POA: Diagnosis not present

## 2021-01-08 DIAGNOSIS — Z8673 Personal history of transient ischemic attack (TIA), and cerebral infarction without residual deficits: Secondary | ICD-10-CM | POA: Diagnosis not present

## 2021-01-08 DIAGNOSIS — F17210 Nicotine dependence, cigarettes, uncomplicated: Secondary | ICD-10-CM | POA: Diagnosis not present

## 2021-01-08 DIAGNOSIS — F1410 Cocaine abuse, uncomplicated: Secondary | ICD-10-CM | POA: Diagnosis not present

## 2021-01-08 MED ORDER — ISOSORB DINITRATE-HYDRALAZINE 20-37.5 MG PO TABS: 2.0000 | ORAL_TABLET | Freq: Two times a day (BID) | ORAL | 6 refills | Status: DC

## 2021-01-08 MED ORDER — DAPAGLIFLOZIN PROPANEDIOL 10 MG PO TABS: 10.0000 mg | ORAL_TABLET | Freq: Every day | ORAL | 6 refills | Status: DC

## 2021-01-08 NOTE — Progress Notes (Addendum)
ADVANCED HF CLINIC NOTE  Referring Physician: Dr. Joya Gaskins HF Cardiologist: DB  HPI:  Mr..Ronald Marsh is a 60 y/o male with multiple medical problems including substance abuse (tobacco, cocaine), HCV (s/p treatment), HTN, HL, DM2, previous TIA and  h/o systolic HF dating back to 2011. Echo 02/2010 with EF 50-55%  Admitted to [facility] 9/19 with acute HF. EF 25-30%. Had cath. No CAD.   Admitted to [facility] 12/19 with recurrent HF and HTN. Echo done 07/03/18 EF 25-30%   Echo 12/20 EF 40-45% RV ok Personally reviewed  Had angioedema with Benicar so not on ACE/ARNI/ARB  Last ECHO 01/20/2020 EF 45-50%.  RV okay  Feeling good since follow up.  Still doing push ups and walking a lot.  Denies chest pain, dyspnea or edema.  Still working on cutting back cigarettes still smoking 4-5 per day.  No alcohol use.  Not checking bp at home really.     Past Medical History:  Diagnosis Date   Diabetes mellitus without complication ([place])    Heart failure ([place])    Hepatitis C    History of CVA (cerebrovascular accident) 02/2010   Left periventricular subcortical ischemic infarction // Residual RUE and RLE weakness   History of depression    surrounding original stroke - has since resolved (04/2012)   Hyperlipidemia    Hypertension    Internal carotid artery stenosis    BL and mild per CT angiogram (05/2010)   Stroke ([place]) 2012   Tobacco abuse     Current Outpatient Medications  Medication Sig Dispense Refill   Accu-Chek FastClix Lancets MISC Use as instructed. Inject into the skin once daily. E11.9 100 each 3   acetaminophen (TYLENOL) 500 MG tablet Take 1 tablet (500 mg total) by mouth every 6 (six) hours as needed for moderate pain. 30 tablet 0   amLODipine (NORVASC) 10 MG tablet TAKE 1 TABLET(10 MG) BY MOUTH DAILY 90 tablet 1   aspirin 81 MG tablet Take 1 tablet (81 mg total) by mouth daily. 30 tablet 5   atorvastatin (LIPITOR) 80 MG tablet Take 1 tablet (80 mg total) by mouth daily. 90 tablet 2    Blood Glucose Calibration (ACCU-CHEK GUIDE CONTROL) LIQD 1 each by In Vitro route once as needed for up to 1 dose. E11.9 1 each 0   Blood Pressure Monitor DEVI Please provide patient with insurance approved blood pressure monitor 1 Device 0   carvedilol (COREG) 12.5 MG tablet Take 1 tablet (12.5 mg total) by mouth 2 (two) times daily with a meal. 60 tablet 6   clopidogrel (PLAVIX) 75 MG tablet Take 1 tablet (75 mg total) by mouth daily. 90 tablet 1   glucose blood (ACCU-CHEK GUIDE) test strip Use as instructed. Check blood glucose by fingerstick once per day.  E11.9 100 each 12   metFORMIN (GLUCOPHAGE) 500 MG tablet 2 with breakfast and 1 with dinner 270 tablet 0   neomycin-polymyxin-hydrocortisone (CORTISPORIN) OTIC solution Apply 2-3 drops to the ingrown toenail site twice daily. Cover with band-aid. 10 mL 0   spironolactone (ALDACTONE) 25 MG tablet TAKE 1 TABLET(25 MG) BY MOUTH DAILY 90 tablet 1   traMADol (ULTRAM) 50 MG tablet Take 1 tablet (50 mg total) by mouth every 12 (twelve) hours as needed. 30 tablet 0   No current facility-administered medications for this encounter.    Allergies  Allergen Reactions   Benicar [Olmesartan] Anaphylaxis and Swelling    Patient cannot name the site of swelling (??)   Ace Inhibitors Swelling  Patient cannot name the site of swelling (??)   Angiotensin Receptor Blockers Swelling    Patient cannot name the site of swelling (??)      Social History   Socioeconomic History   Marital status: Married    Spouse name: Not on file   Number of children: 1   Years of education: 11th grade   Highest education level: Not on file  Occupational History   Occupation: On disability    Comment: previously worked for [employer] until his stroke in 2011.   Tobacco Use   Smoking status: Some Days    Packs/day: 0.30    Years: 31.00    Pack years: 9.30    Types: Cigarettes    Start date: 04/25/1981   Smokeless tobacco: Never   Tobacco comments:     up to 3 cigs/day; cutting back.  Vaping Use   Vaping Use: Never used  Substance and Sexual Activity   Alcohol use: Not Currently    Comment: occasional   Drug use: No    Comment: Former smoke crack    Sexual activity: Yes  Other Topics Concern   Not on file  Social History Narrative   Lives in [place] with wife.   Has been incarcerated multiple times, last time was in 2012 for 8 months.   Social Determinants of Health   Financial Resource Strain: Not on file  Food Insecurity: Not on file  Transportation Needs: Not on file  Physical Activity: Not on file  Stress: Not on file  Social Connections: Not on file  Intimate Partner Violence: Not on file      Family History  Problem Relation Age of Onset   Diabetes Mother    Heart failure Mother    Hypertension Mother    Hyperlipidemia Mother    Glaucoma Mother    Lung cancer Father    Hypertension Brother    Diabetes Brother    Pancreatic cancer Brother    Diabetes Sister    Arthritis Sister     Vitals:   01/08/21 1416  BP: 120/78  Pulse: 71  SpO2: 98%  Weight: 85.9 kg (189 lb 6.4 oz)     PHYSICAL EXAM: General:  Well appearing. No resp difficulty HEENT: normal Neck: supple. no JVD. Carotids 2+ bilat; no bruits. No lymphadenopathy or thryomegaly appreciated. Cor: PMI nondisplaced. Regular rate & rhythm. No rubs, gallops or murmurs. Lungs: clear Decreased breath sounds throughout  + boil on upper back Abdomen: soft, nontender, nondistended. No hepatosplenomegaly. No bruits or masses. Good bowel sounds. Extremities: no cyanosis, clubbing, rash, edema Neuro: alert & orientedx3, cranial nerves grossly intact. moves all 4 extremities w/o difficulty. Affect pleasant    ASSESSMENT & PLAN:  1. Chronic systolic HF due to NICM - Echo 9/19 EF 25-30%, Echo 2/20 EF 25-30 - Cath 9/19 [facility]. No CAD - Echo 12/20 EF improved to 40-45% -Last ECHO 01/20/2020 EF 45-50%.  RV okay - Suspect due to HTN and substance use -  Stable NYHA I - Volume status looks good.  - Continue carvedilol 12.5 bid - Continue spiro 25 daily. - No ACE/ARB/ARNI with angioedema to Benicar in past - Continue Bidil 2 tabs tid - Denies further use of ETOH and substance abuse - Start Farxiga '10mg'$  daily  2. HTN - Blood pressure well controlled. Continue current regimen.  3. Substance abuse - reports continued cessation drug screen 3 weeks ago compatible with this  4. CVA - Continue DAPT and statin  - Minimal  residual deficit   5. HCV - treated. last VL undetectable   6. Tobacco use - encouraged to quit    Katherine Roan, MD  2:30 PM  Patient seen and examined with the above-signed Advanced Practice Provider and/or Housestaff. I personally reviewed laboratory data, imaging studies and relevant notes. I independently examined the patient and formulated the important aspects of the plan. I have edited the note to reflect any of my changes or salient points. I have personally discussed the plan with the patient and/or family.  Feels good. No CP, SOB, orthopnea or PND. Complaint with meds.   General:  Well appearing. No resp difficulty HEENT: normal Neck: supple. no JVD. Carotids 2+ bilat; no bruits. No lymphadenopathy or thryomegaly appreciated. Cor: PMI nondisplaced. Regular rate & rhythm. No rubs, gallops or murmurs. Lungs: clear Abdomen: soft, nontender, nondistended. No hepatosplenomegaly. No bruits or masses. Good bowel sounds. Extremities: no cyanosis, clubbing, rash, edema Neuro: alert & orientedx3, cranial nerves grossly intact. moves all 4 extremities w/o difficulty. Affect pleasant  EF essentially recovered. NYHA I.-II Volume status ok. Will add Farxiga 10. Check labs. Encouraged smoking cessation.   Glori Bickers, MD  4:10 PM

## 2021-01-08 NOTE — Telephone Encounter (Signed)
Advanced Heart Failure Patient Advocate Encounter   Received notification from [facility] Medicaid that prior authorization for Wilder Glade is required.   PA submitted on CoverMyMeds Key  [id number] Status is pending   Will continue to follow.

## 2021-01-08 NOTE — Patient Instructions (Signed)
Start Farxiga 10 mg Daily  Refill for Bidil sent in to pharmacy  Please call our office in February 2023 to schedule your follow up appointment  If you have any questions or concerns before your next appointment please send [place] a message through [place] or call our office at [phone].    TO LEAVE A MESSAGE FOR THE NURSE SELECT OPTION 2, PLEASE LEAVE A MESSAGE INCLUDING: YOUR NAME DATE OF BIRTH CALL BACK NUMBER REASON FOR CALL**this is important as we prioritize the call backs  YOU WILL RECEIVE A CALL BACK THE SAME DAY AS LONG AS YOU CALL BEFORE 4:00 PM  At the [facility], you and your health needs are our priority. As part of our continuing mission to provide you with exceptional heart care, we have created designated Provider Care Teams. These Care Teams include your primary Cardiologist (physician) and Advanced Practice Providers (APPs- Physician Assistants and Nurse Practitioners) who all work together to provide you with the care you need, when you need it.   You may see any of the following providers on your designated Care Team at your next follow up: Dr Glori Bickers Dr Loralie Champagne Dr Patrice Paradise, NP Lyda Jester, [place] Ginnie Smart Audry Riles, PharmD   Please be sure to bring in all your medications bottles to every appointment.

## 2021-01-22 ENCOUNTER — Other Ambulatory Visit: Payer: Self-pay

## 2021-01-22 NOTE — Patient Instructions (Signed)
Visit Information  Ronald Marsh was given information about Medicaid Managed Care team care coordination services as a part of their [place] Medicaid benefit. Ronald Marsh verbally consented to engagement with the [facility] Managed Care team.   If you are experiencing a medical emergency, please call 911 or report to your local emergency department or urgent care.   If you have a non-emergency medical problem during routine business hours, please contact your provider's office and ask to speak with a nurse.   For questions related to your [facility], please call: [phone] or visit the homepage here: [email or web address]  If you would like to schedule transportation through your [facility], please call the following number at least 2 days in advance of your appointment: [phone].   Call the [place] at [phone], at any time, 24 hours a day, 7 days a week. If you are in danger or need immediate medical attention call 911.  If you would like help to quit smoking, call 1-800-QUIT-NOW (234)735-0017) OR Espaol: 1-855-Djelo-Ya HD:1601594) o para ms informacin haga clic aqu or Text READY to 200-400 to register via text  Ronald Marsh - following are the goals we discussed in your visit today:   Goals Addressed   None      Social Worker will follow up with patient .   Ronald Marsh, BSW, [place] Managed Medicaid Team  [phone]   Following is a copy of your plan of care:  There are no care plans to display for this patient.

## 2021-01-22 NOTE — Patient Outreach (Signed)
Medicaid Managed Care Social Work Note  01/22/2021 Name:  Ronald Marsh [name]:  [id number] DOB:  [birth year]  Jaya Frana is an 60 y.o. year old male who is a primary patient of [name], NP.  The Medicaid Managed Care Coordination team was consulted for assistance with:  Community Resources   Ronald Marsh was given information about Medicaid Managed Care Coordination team services today. Ronald Marsh Patient agreed to services and verbal consent obtained.  Engaged with patient  for by telephone forinitial visit in response to referral for case management and/or care coordination services.   Assessments/Interventions:  Review of past medical history, allergies, medications, health status, including review of consultants reports, laboratory and other test data, was performed as part of comprehensive evaluation and provision of chronic care management services.  SDOH: (Social Determinant of Health) assessments and interventions performed:  BSW received a referral from [facility] stating that patient is in need of an OTC card and healthy opportunities program. Patient stated that he did speak with someone and they stated the card would take 14 days for him to get it. BSW wll complete more research and get back to patient. Patient stated no other resources are needed at this time.   Advanced Directives Status:  Not addressed in this encounter.  Care Plan                 Allergies  Allergen Reactions   Benicar [Olmesartan] Anaphylaxis and Swelling    Patient cannot name the site of swelling (??)   Ace Inhibitors Swelling    Patient cannot name the site of swelling (??)   Angiotensin Receptor Blockers Swelling    Patient cannot name the site of swelling (??)    Medications Reviewed Today     Reviewed by [name], NP (Nurse Practitioner) on 12/15/20 at 2100  Med List Status: <None>   Medication Order Taking? Sig Documenting Provider Last Dose Status Informant  Accu-Chek  FastClix Lancets MISC [id number] Yes Use as instructed. Inject into the skin once daily. E11.9 [name], NP Taking Active   acetaminophen (TYLENOL) 500 MG tablet [id number] Yes Take 1 tablet (500 mg total) by mouth every 6 (six) hours as needed for moderate pain. Corena Herter, PA-C Taking Active   amLODipine (NORVASC) 10 MG tablet [id number]  TAKE 1 TABLET(10 MG) BY MOUTH DAILY [name], NP  Active   aspirin 81 MG tablet [id number] Yes Take 1 tablet (81 mg total) by mouth daily. Elsie Stain, MD Taking Active   atorvastatin (LIPITOR) 80 MG tablet [id number]  Take 1 tablet (80 mg total) by mouth daily. [name], NP  Active   Blood Glucose Calibration (ACCU-CHEK GUIDE CONTROL) [name] [id number] Yes 1 each by In Vitro route once as needed for up to 1 dose. E11.9 [name], NP Taking Active   Blood Pressure Monitor DEVI [id number] Yes Please provide patient with insurance approved blood pressure monitor [name], NP Taking Active   carvedilol (COREG) 12.5 MG tablet [id number]  Take 1 tablet (12.5 mg total) by mouth 2 (two) times daily with a meal. [name], NP  Active   clopidogrel (PLAVIX) 75 MG tablet [id number]  Take 1 tablet (75 mg total) by mouth daily. [name], NP  Active   glucose blood (ACCU-CHEK GUIDE) test strip [id number] Yes Use as instructed. Check blood glucose by fingerstick once per day.  E11.9 [name], NP Taking Active   metFORMIN (GLUCOPHAGE)  500 MG tablet [id number]  2 with breakfast and 1 with dinner [name], NP  Active   neomycin-polymyxin-hydrocortisone (CORTISPORIN) OTIC solution [id number] Yes Apply 2-3 drops to the ingrown toenail site twice daily. Cover with band-aid. [name], DPM Taking Active   spironolactone (ALDACTONE) 25 MG tablet [id number]  TAKE 1 TABLET(25 MG) BY MOUTH DAILY [name], NP  Active   traMADol (ULTRAM) 50 MG tablet [id number] Yes Take 1 tablet (50 mg total) by mouth every 12  (twelve) hours as needed. [name], NP  Active             Patient Active Problem List   Diagnosis Date Noted   Hammer toes of both feet 03/22/2019   Coagulation disorder ([place]) 03/22/2019   Pain due to onychomycosis of toenails of both feet 12/11/2018   Chronic hepatitis C without hepatic coma ([place]) 06/13/2018   Toenail fungus 06/12/2018   Bilateral bunions 06/12/2018   Acute on chronic diastolic CHF (congestive heart failure) ([place]) 05/06/2018   Cardiomyopathy ([place]) 01/31/2018   History of substance abuse ([place]) 02/19/2016   Lumbar radiculopathy 10/01/2015   Diabetes mellitus type 2, controlled ([place]) 07/03/2012   Tobacco abuse    Hypertension    Hyperlipidemia    History of CVA (cerebrovascular accident) 02/06/2010    Conditions to be addressed/monitored per PCP order:   community resources  There are no care plans that you recently modified to display for this patient.   Follow up:  Patient agrees to Care Plan and Follow-up.  Plan: The Managed Medicaid care management team will reach out to the patient again over the next 7 days.  Date/time of next scheduled Social Work care management/care coordination outreach:  02/02/21  Mickel [name], Arita Miss, [place] Medicaid Team  [phone]

## 2021-02-02 ENCOUNTER — Other Ambulatory Visit: Payer: Self-pay

## 2021-02-02 NOTE — Patient Outreach (Addendum)
Medicaid Managed Care Social Work Note  02/02/2021 Name:  Ronald Marsh [name]:  [id number] DOB:  11-10-1960  Ronald Marsh is an 60 y.o. year old male who is a primary patient of Ronald Pounds, NP.  The [facility] Managed Care Coordination team was consulted for assistance with:   OTC Card  [name] was given information about Medicaid Managed Care Coordination team services today. Ronald Marsh Patient agreed to services and verbal consent obtained.  Engaged with patient  for by telephone forfollow up visit in response to referral for case management and/or care coordination services.   Assessments/Interventions:  Review of past medical history, allergies, medications, health status, including review of consultants reports, laboratory and other test data, was performed as part of comprehensive evaluation and provision of chronic care management services.  SDOH: (Social Determinant of Health) assessments and interventions performed: BSW contacted patient to follow up on if he receieved his OTC Card. Patient stated he still had not received the card. BSW contacted Ronald Marsh and spoke with a rep. Rep stated that patient does not qualify for the OTC card because he only has MM and not the dual UHC. Patient stated that someone from [facility] completed the application for him. BSW asked patient if he knew who he spoke with he stated he did not. BSW informed patient that he did not qualify for the OTC Card or the HOP program. Patient understood. Patient stated no other resources are needed.   Advanced Directives Status:  Not addressed in this encounter.  Care Plan                 Allergies  Allergen Reactions   Benicar [Olmesartan] Anaphylaxis and Swelling    Patient cannot name the site of swelling (??)   Ace Inhibitors Swelling    Patient cannot name the site of swelling (??)   Angiotensin Receptor Blockers Swelling    Patient cannot name the site of swelling (??)    Medications Reviewed Today      Reviewed by Ronald Pounds, NP (Nurse Practitioner) on 12/15/20 at 2100  Med List Status: <None>   Medication Order Taking? Sig Documenting Provider Last Dose Status Informant  Accu-Chek FastClix Lancets MISC [id number] Yes Use as instructed. Inject into the skin once daily. E11.9 Ronald Pounds, NP Taking Active   acetaminophen (TYLENOL) 500 MG tablet [id number] Yes Take 1 tablet (500 mg total) by mouth every 6 (six) hours as needed for moderate pain. Corena Herter, PA-C Taking Active   amLODipine (NORVASC) 10 MG tablet [id number]  TAKE 1 TABLET(10 MG) BY MOUTH DAILY Ronald Pounds, NP  Active   aspirin 81 MG tablet [id number] Yes Take 1 tablet (81 mg total) by mouth daily. Elsie Stain, MD Taking Active   atorvastatin (LIPITOR) 80 MG tablet [id number]  Take 1 tablet (80 mg total) by mouth daily. Ronald Pounds, NP  Active   Blood Glucose Calibration (ACCU-CHEK GUIDE CONTROL) [name] [id number] Yes 1 each by In Vitro route once as needed for up to 1 dose. E11.9 Ronald Pounds, NP Taking Active   Blood Pressure Monitor DEVI [id number] Yes Please provide patient with insurance approved blood pressure monitor Ronald Pounds, NP Taking Active   carvedilol (COREG) 12.5 MG tablet [id number]  Take 1 tablet (12.5 mg total) by mouth 2 (two) times daily with a meal. Ronald Pounds, NP  Active   clopidogrel (PLAVIX) 75 MG tablet [id number]  Take 1 tablet (75 mg total) by mouth  daily. Ronald Pounds, NP  Active   glucose blood (ACCU-CHEK GUIDE) test strip [id number] Yes Use as instructed. Check blood glucose by fingerstick once per day.  E11.9 Ronald Pounds, NP Taking Active   metFORMIN (GLUCOPHAGE) 500 MG tablet [id number]  2 with breakfast and 1 with dinner Ronald Pounds, NP  Active   neomycin-polymyxin-hydrocortisone (CORTISPORIN) OTIC solution [id number] Yes Apply 2-3 drops to the ingrown toenail site twice daily. Cover with band-aid. [name], DPM Taking Active   spironolactone  (ALDACTONE) 25 MG tablet [id number]  TAKE 1 TABLET(25 MG) BY MOUTH DAILY Ronald Pounds, NP  Active   traMADol (ULTRAM) 50 MG tablet [id number] Yes Take 1 tablet (50 mg total) by mouth every 12 (twelve) hours as needed. Ronald Pounds, NP  Active             Patient Active Problem List   Diagnosis Date Noted   Hammer toes of both feet 03/22/2019   Coagulation disorder ([place]) 03/22/2019   Pain due to onychomycosis of toenails of both feet 12/11/2018   Chronic hepatitis C without hepatic coma ([place]) 06/13/2018   Toenail fungus 06/12/2018   Bilateral bunions 06/12/2018   Acute on chronic diastolic CHF (congestive heart failure) ([place]) 05/06/2018   Cardiomyopathy ([place]) 01/31/2018   History of substance abuse ([place]) 02/19/2016   Lumbar radiculopathy 10/01/2015   Diabetes mellitus type 2, controlled ([place]) 07/03/2012   Tobacco abuse    Hypertension    Hyperlipidemia    History of CVA (cerebrovascular accident) 02/06/2010    Conditions to be addressed/monitored per PCP order:   OTC Card   There are no care plans that you recently modified to display for this patient.   Follow up:  Patient agrees to Care Plan and Follow-up.  Plan: The patient has been provided with contact information for the Managed Medicaid care management team and has been advised to call with any health related questions or concerns.    Mickel [name], BSW, [place] Managed Medicaid Team  [phone]

## 2021-02-02 NOTE — Patient Instructions (Signed)
Visit Information  Ronald Marsh was given information about Medicaid Managed Care team care coordination services as a part of their [place] Medicaid benefit. Ronald Marsh verbally consented to engagement with the [facility] Managed Care team.   If you are experiencing a medical emergency, please call 911 or report to your local emergency department or urgent care.   If you have a non-emergency medical problem during routine business hours, please contact your provider's office and ask to speak with a nurse.   For questions related to your [facility], please call: [phone] or visit the homepage here: [email or web address]  If you would like to schedule transportation through your [facility], please call the following number at least 2 days in advance of your appointment: [phone].   Call the [place] at [phone], at any time, 24 hours a day, 7 days a week. If you are in danger or need immediate medical attention call 911.  If you would like help to quit smoking, call 1-800-QUIT-NOW 682-325-4004) OR Espaol: 1-855-Djelo-Ya (4-650-354-6568) o para ms informacin haga clic aqu or Text READY to 200-400 to register via text  Ronald Marsh - following are the goals we discussed in your visit today:   Goals Addressed   None      The  Patient                                              has been provided with contact information for the Managed Medicaid care management team and has been advised to call with any health related questions or concerns.   Ronald Marsh, BSW, [place] Managed Medicaid Team  [phone]   Following is a copy of your plan of care:  There are no care plans to display for this patient.

## 2021-02-22 NOTE — Telephone Encounter (Signed)
Advanced Heart Failure Patient Advocate Encounter  Prior Authorization for Wilder Glade has been approved.    PA#  [id number] Effective dates: 01/08/21 through 01/08/22  Charlann Boxer, CPhT

## 2021-03-08 ENCOUNTER — Other Ambulatory Visit: Payer: Self-pay | Admitting: Nurse Practitioner

## 2021-03-08 DIAGNOSIS — Z0289 Encounter for other administrative examinations: Secondary | ICD-10-CM

## 2021-03-08 NOTE — Telephone Encounter (Signed)
Requested medications are due for refill today.  yes  Requested medications are on the active medications list.  yes  Last refill. 12/14/2020  Future visit scheduled.   yes  Notes to clinic.  Medication not delegated.

## 2021-03-16 ENCOUNTER — Other Ambulatory Visit: Payer: Self-pay

## 2021-03-16 ENCOUNTER — Encounter: Payer: Self-pay | Admitting: Nurse Practitioner

## 2021-03-16 ENCOUNTER — Ambulatory Visit: Payer: Medicaid Other | Attending: Nurse Practitioner | Admitting: Nurse Practitioner

## 2021-03-16 DIAGNOSIS — I10 Essential (primary) hypertension: Secondary | ICD-10-CM | POA: Diagnosis not present

## 2021-03-16 DIAGNOSIS — Z1211 Encounter for screening for malignant neoplasm of colon: Secondary | ICD-10-CM | POA: Diagnosis not present

## 2021-03-16 DIAGNOSIS — M792 Neuralgia and neuritis, unspecified: Secondary | ICD-10-CM | POA: Diagnosis not present

## 2021-03-16 DIAGNOSIS — E1142 Type 2 diabetes mellitus with diabetic polyneuropathy: Secondary | ICD-10-CM

## 2021-03-16 DIAGNOSIS — Z720 Tobacco use: Secondary | ICD-10-CM

## 2021-03-16 DIAGNOSIS — I69398 Other sequelae of cerebral infarction: Secondary | ICD-10-CM | POA: Diagnosis not present

## 2021-03-16 MED ORDER — GABAPENTIN 600 MG PO TABS: 300.0000 mg | ORAL_TABLET | Freq: Every day | ORAL | 0 refills | Status: DC

## 2021-03-16 NOTE — Progress Notes (Signed)
Virtual Visit via Telephone Note Due to national recommendations of social distancing due to [place] 19, telehealth visit is felt to be most appropriate for this patient at this time.  I discussed the limitations, risks, security and privacy concerns of performing an evaluation and management service by telephone and the availability of in person appointments. I also discussed with the patient that there may be a patient responsible charge related to this service. The patient expressed understanding and agreed to proceed.    I connected with Ronald Marsh on 03/16/21  at   3:30 PM EST  EDT by telephone and verified that I am speaking with the correct person using two identifiers.  Location of Patient: Private Residence   Location of Provider: [place] [facility] [employer] Office    Persons participating in Telemedicine visit: Geryl Rankins FNP-BC [place]    History of Present Illness: Telemedicine visit for: HTN He has a past medical history of DM2, Heart failure, Hepatitis C, History of CVA (02/2010), Depression, Hyperlipidemia, Hypertension, Internal carotid artery stenosis, Stroke (2012), and Tobacco abuse.   DM 2 Well controlled with metformin 1000 mg in the am and 500 mg in the pm and farxiga 10 mg daily. LDL at goal with atorvastatin 80 mg d Lab Results  Component Value Date   HGBA1C 6.0 05/20/2020   Lab Results  Component Value Date   LDLCALC 54 12/14/2020    HTN Blood pressure is well controlled with amlodipine 10 mg daily, carvedilol 12.5 mg BID, Bidil 40-75mg  BID, and spirinolactone 25 mg daily. 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 No pertinent surgical history.  Family History  Problem Relation Age of Onset   Diabetes Mother    Heart failure Mother    Hypertension Mother    Hyperlipidemia Mother    Glaucoma Mother    Lung cancer Father    Hypertension Brother    Diabetes Brother    Pancreatic cancer Brother    Diabetes Sister    Arthritis Sister     Social History   Socioeconomic History   Marital status: Married    Spouse name: Not on Marsh   Number of children: 1   Years of education: 11th grade   Highest education level: Not on Marsh  Occupational History   Occupation: On disability    Comment: previously worked for [employer] until his stroke in 2011.   Tobacco Use   Smoking status: Some Days    Packs/day: 0.30    Years: 31.00    Pack years: 9.30    Types: Cigarettes    Start date: 04/25/1981   Smokeless tobacco: Never   Tobacco comments:    up to 3 cigs/day; cutting back.  Vaping Use   Vaping Use: Never used  Substance and Sexual Activity   Alcohol use: Not Currently  Comment: occasional   Drug use: No    Comment: Former smoke crack    Sexual activity: Yes  Other Topics Concern   Not on Marsh  Social History Narrative   Lives in [place] with [place].   Has been incarcerated multiple times, last time was in 2012 for 8 months.   Social Determinants of Radio broadcast assistant Strain: Not on Marsh  Food Insecurity: No Food Insecurity   Worried About Charity fundraiser in the Last Year: Never true   Ran Out of Food in the Last Year: Never true  Transportation Needs: Not on Marsh  Physical Activity: Not on Marsh  Stress: Not on Marsh  Social Connections: Not on Marsh     Observations/Objective: Awake, alert and  oriented x 3   Review of Systems  Constitutional:  Negative for fever, malaise/fatigue and weight loss.  HENT: Negative.  Negative for nosebleeds.   Eyes: Negative.  Negative for blurred vision, double vision and photophobia.  Respiratory: Negative.  Negative for cough and shortness of breath.   Cardiovascular: Negative.  Negative for chest pain, palpitations and leg swelling.  Gastrointestinal: Negative.  Negative for heartburn, nausea and vomiting.  Musculoskeletal:  Positive for myalgias.       Chronic pain  Neurological:  Positive for sensory change. Negative for dizziness, focal weakness, seizures and headaches.  Psychiatric/Behavioral: Negative.  Negative for suicidal ideas.    Assessment and Plan: Diagnoses and all orders for this visit:  Controlled type 2 diabetes mellitus with diabetic polyneuropathy, without long-term current use of insulin ([place])  Essential hypertension Continue all antihypertensives as prescribed.  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  Colon cancer screening -     Ambulatory referral to Gastroenterology  Tobacco abuse  Frederich was counseled on the dangers of tobacco use, and was advised to quit. Reviewed strategies to maximize success, including removing cigarettes and smoking materials from environment, stress management and support of family/friends as well as pharmacological alternatives including: Wellbutrin, Chantix, Nicotine patch, Nicotine gum or lozenges. Smoking cessation support: smoking cessation hotline: 1-800-QUIT-NOW.  Smoking cessation classes are also available through [facility]. Call [phone] or visit our website at [email or web address]   A total of 3 minutes was spent on counseling for smoking cessation and Cobin is not ready to quit.     Follow Up Instructions Return in about 6 weeks (around 04/27/2021) for HTN/prediabetes.     I discussed the assessment and treatment plan with the patient. The patient was provided an opportunity to ask questions and all were answered. The patient agreed with the plan and demonstrated an understanding of the instructions.   The patient was advised to call back or seek an in-person evaluation if the symptoms worsen or if the condition fails to improve as anticipated.  I provided 10 minutes of non-face-to-face time during this encounter including median intraservice time, reviewing previous notes, labs, imaging, medications and explaining diagnosis and management.  [name], FNP-BC

## 2021-03-17 ENCOUNTER — Encounter: Payer: Self-pay | Admitting: Nurse Practitioner

## 2021-03-18 ENCOUNTER — Telehealth: Payer: Self-pay | Admitting: Nurse Practitioner

## 2021-03-18 ENCOUNTER — Other Ambulatory Visit: Payer: Self-pay | Admitting: Nurse Practitioner

## 2021-03-18 MED ORDER — SENNA-DOCUSATE SODIUM 8.6-50 MG PO TABS: 2.0000 | ORAL_TABLET | Freq: Every day | ORAL | 3 refills | Status: AC

## 2021-03-18 NOTE — Telephone Encounter (Signed)
Pt called in regards to his virtual visit. He states that a medication was sent to pharmacy to help with his constipation. Please advise.        [facility] [phone] - [place], [place] [address]  [place] [address]  Phone: [phone] Fax: [phone]  Hours: Not open 24 hours

## 2021-04-06 ENCOUNTER — Ambulatory Visit: Payer: Medicaid Other | Attending: Nurse Practitioner

## 2021-04-06 ENCOUNTER — Other Ambulatory Visit: Payer: Self-pay

## 2021-04-06 ENCOUNTER — Other Ambulatory Visit: Payer: Self-pay | Admitting: Nurse Practitioner

## 2021-04-06 DIAGNOSIS — D649 Anemia, unspecified: Secondary | ICD-10-CM

## 2021-04-06 DIAGNOSIS — E1142 Type 2 diabetes mellitus with diabetic polyneuropathy: Secondary | ICD-10-CM | POA: Diagnosis not present

## 2021-04-06 DIAGNOSIS — Z125 Encounter for screening for malignant neoplasm of prostate: Secondary | ICD-10-CM

## 2021-04-07 LAB — CMP14+EGFR
ALT: 14 IU/L (ref 0–44)
AST: 18 IU/L (ref 0–40)
Albumin/Globulin Ratio: 1.7 (ref 1.2–2.2)
Albumin: 4.3 g/dL (ref 3.8–4.9)
Alkaline Phosphatase: 62 IU/L (ref 44–121)
BUN/Creatinine Ratio: 11 (ref 9–20)
BUN: 10 mg/dL (ref 6–24)
Bilirubin Total: 0.3 mg/dL (ref 0.0–1.2)
CO2: 25 mmol/L (ref 20–29)
Calcium: 9.1 mg/dL (ref 8.7–10.2)
Chloride: 103 mmol/L (ref 96–106)
Creatinine, Ser: 0.89 mg/dL (ref 0.76–1.27)
Globulin, Total: 2.5 g/dL (ref 1.5–4.5)
Glucose: 158 mg/dL — ABNORMAL HIGH (ref 70–99)
Potassium: 4.4 mmol/L (ref 3.5–5.2)
Sodium: 140 mmol/L (ref 134–144)
Total Protein: 6.8 g/dL (ref 6.0–8.5)
eGFR: 99 mL/min/{1.73_m2} (ref >59)

## 2021-04-07 LAB — CBC WITH DIFFERENTIAL/PLATELET
Basophils Absolute: 0 10*3/uL (ref 0.0–0.2)
Basos: 0 %
EOS (ABSOLUTE): 0.1 10*3/uL (ref 0.0–0.4)
Eos: 2 %
Hematocrit: 36.7 % — ABNORMAL LOW (ref 37.5–51.0)
Hemoglobin: 12.5 g/dL — ABNORMAL LOW (ref 13.0–17.7)
Immature Grans (Abs): 0 10*3/uL (ref 0.0–0.1)
Immature Granulocytes: 0 %
Lymphocytes Absolute: 1.6 10*3/uL (ref 0.7–3.1)
Lymphs: 29 %
MCH: 31.4 pg (ref 26.6–33.0)
MCHC: 34.1 g/dL (ref 31.5–35.7)
MCV: 92 fL (ref 79–97)
Monocytes Absolute: 0.5 10*3/uL (ref 0.1–0.9)
Monocytes: 9 %
Neutrophils Absolute: 3.2 10*3/uL (ref 1.4–7.0)
Neutrophils: 60 %
Platelets: 212 10*3/uL (ref 150–450)
RBC: 3.98 x10E6/uL — ABNORMAL LOW (ref 4.14–5.80)
RDW: 12.3 % (ref 11.6–15.4)
WBC: 5.5 10*3/uL (ref 3.4–10.8)

## 2021-04-07 LAB — HEMOGLOBIN A1C
Est. average glucose Bld gHb Est-mCnc: 126 mg/dL
Hgb A1c MFr Bld: 6 % — ABNORMAL HIGH (ref 4.8–5.6)

## 2021-04-07 LAB — PSA: Prostate Specific Ag, Serum: 3.8 ng/mL (ref 0.0–4.0)

## 2021-04-09 ENCOUNTER — Telehealth: Payer: Self-pay

## 2021-04-09 NOTE — Telephone Encounter (Signed)
-----   Message from [name], NP sent at 04/08/2021  5:51 PM EST ----- Prostate level normal.  Glucose levels elevated.  Kidney, liver function and electrolytes are normal.   A1c still in prediabetes range. CBC shows anemia. Please call below to schedule your colonoscopy: Rudolpho Sevin PH# [phone]

## 2021-04-09 NOTE — Telephone Encounter (Signed)
Pt. Given results and instructions. Verbalizes understanding. 

## 2021-04-09 NOTE — Telephone Encounter (Signed)
Left message to return call to our office.  

## 2021-04-16 ENCOUNTER — Encounter: Payer: Self-pay | Admitting: Nurse Practitioner

## 2021-04-16 ENCOUNTER — Ambulatory Visit: Payer: Medicaid Other | Attending: Nurse Practitioner | Admitting: Nurse Practitioner

## 2021-04-16 ENCOUNTER — Other Ambulatory Visit: Payer: Self-pay

## 2021-04-16 VITALS — BP 133/76 | HR 66 | Ht 74.5 in | Wt 187.1 lb

## 2021-04-16 DIAGNOSIS — E1142 Type 2 diabetes mellitus with diabetic polyneuropathy: Secondary | ICD-10-CM | POA: Diagnosis not present

## 2021-04-16 DIAGNOSIS — I10 Essential (primary) hypertension: Secondary | ICD-10-CM | POA: Diagnosis not present

## 2021-04-16 DIAGNOSIS — Z23 Encounter for immunization: Secondary | ICD-10-CM | POA: Diagnosis not present

## 2021-04-16 DIAGNOSIS — E7800 Pure hypercholesterolemia, unspecified: Secondary | ICD-10-CM

## 2021-04-16 MED ORDER — ATORVASTATIN CALCIUM 80 MG PO TABS: 80.0000 mg | ORAL_TABLET | Freq: Every day | ORAL | 2 refills | Status: DC

## 2021-04-16 NOTE — Progress Notes (Signed)
Assessment & Plan:  Ronald Marsh was seen today for hypertension.  Diagnoses and all orders for this visit:  Essential hypertension Continue all medications as prescribed Monitor blood pressure at home daily  Controlled type 2 diabetes mellitus with diabetic polyneuropathy, without long-term current use of insulin (HCC) Continue blood sugar control as discussed in office today, low carbohydrate diet, and regular physical exercise as tolerated, 150 minutes per week (30 min each day, 5 days per week, or 50 min 3 days per week). Keep blood sugar logs with fasting goal of 90-130 mg/dl, post prandial (after you eat) less than 180.  For Hypoglycemia: BS <60 and Hyperglycemia BS >400; contact the clinic ASAP. Annual eye exams and foot exams are recommended.   Need for influenza vaccination -     Flu Vaccine QUAD 60mo+IM (Fluarix, Fluzone & Alfiuria Quad PF)  Pure hypercholesterolemia -     atorvastatin (LIPITOR) 80 MG tablet; Take 1 tablet (80 mg total) by mouth daily.   Patient has been counseled on age-appropriate routine health concerns for screening and prevention. These are reviewed and up-to-date. Referrals have been placed accordingly. Immunizations are up-to-date or declined.    Subjective:   Chief Complaint  Patient presents with   Hypertension   HPI Ronald Marsh 60 y.o. male presents to office today for follow-up to hypertension He has a past medical history of DM2, Heart failure (EF 25 to 30%), Hepatitis C (treated), History of CVA (02/2010), History of depression, Hyperlipidemia, Hypertension, Internal carotid artery stenosis, Stroke (2012), and Tobacco abuse.   He is being followed by Dr. Clayborne Dana office for his heart failure   I have given him the phone number for [facility] to get his colonoscopy scheduled.   HTN Blood pressure is well controlled with carvedilol 12.5 mg twice daily, BiDil 20-37.5 mg 2 tablets twice daily and spironolactone 25 mg daily.  He does not  monitor his blood pressure at home and he does continue to smoke cigarettes but has cut back. BP Readings from Last 3 Encounters:  04/16/21 133/76  01/08/21 120/78  12/14/20 (!) 143/82    DM 2 Diabetes is well controlled with Farxiga 10 mg daily and metformin 1000 mg in the a.m. and 500 mg in the p.m. LDL at goal with atorvastatin 80 mg daily. Lab Results  Component Value Date   HGBA1C 6.0 (H) 04/06/2021    Lab Results  Component Value Date   LDLCALC 54 12/14/2020     Review of Systems  Constitutional:  Negative for fever, malaise/fatigue and weight loss.  HENT: Negative.  Negative for nosebleeds.   Eyes: Negative.  Negative for blurred vision, double vision and photophobia.  Respiratory: Negative.  Negative for cough and shortness of breath.   Cardiovascular: Negative.  Negative for chest pain, palpitations and leg swelling.  Gastrointestinal: Negative.  Negative for heartburn, nausea and vomiting.  Musculoskeletal: Negative.  Negative for myalgias.  Neurological: Negative.  Negative for dizziness, focal weakness, seizures and headaches.  Psychiatric/Behavioral: Negative.  Negative for suicidal ideas.    Past Medical History:  Diagnosis Date   Diabetes mellitus without complication ([place])    Heart failure ([place])    Hepatitis C    History of CVA (cerebrovascular accident) 02/2010   Left periventricular subcortical ischemic infarction // Residual RUE and RLE weakness   History of depression    surrounding original stroke - has since resolved (04/2012)   Hyperlipidemia    Hypertension    Internal carotid artery stenosis  BL and mild per CT angiogram (05/2010)   Stroke [facility]) 2012   Tobacco abuse     No past surgical history on file.  Family History  Problem Relation Age of Onset   Diabetes Mother    Heart failure Mother    Hypertension Mother    Hyperlipidemia Mother    Glaucoma Mother    Lung cancer Father    Hypertension Brother    Diabetes Brother     Pancreatic cancer Brother    Diabetes Sister    Arthritis Sister     Social History Reviewed with no changes to be made today.   Outpatient Medications Prior to Visit  Medication Sig Dispense Refill   Accu-Chek FastClix Lancets MISC Use as instructed. Inject into the skin once daily. E11.9 100 each 3   acetaminophen (TYLENOL) 500 MG tablet Take 1 tablet (500 mg total) by mouth every 6 (six) hours as needed for moderate pain. 30 tablet 0   amLODipine (NORVASC) 10 MG tablet TAKE 1 TABLET(10 MG) BY MOUTH DAILY 90 tablet 1   aspirin 81 MG tablet Take 1 tablet (81 mg total) by mouth daily. 30 tablet 5   Blood Glucose Calibration (ACCU-CHEK GUIDE CONTROL) LIQD 1 each by In Vitro route once as needed for up to 1 dose. E11.9 1 each 0   Blood Pressure Monitor DEVI Please provide patient with insurance approved blood pressure monitor 1 Device 0   carvedilol (COREG) 12.5 MG tablet Take 1 tablet (12.5 mg total) by mouth 2 (two) times daily with a meal. 60 tablet 6   clopidogrel (PLAVIX) 75 MG tablet Take 1 tablet (75 mg total) by mouth daily. 90 tablet 1   dapagliflozin propanediol (FARXIGA) 10 MG TABS tablet Take 1 tablet (10 mg total) by mouth daily before breakfast. 30 tablet 6   gabapentin (NEURONTIN) 600 MG tablet Take 0.5-1 tablets (300-600 mg total) by mouth at bedtime. 90 tablet 0   glucose blood (ACCU-CHEK GUIDE) test strip Use as instructed. Check blood glucose by fingerstick once per day.  E11.9 100 each 12   isosorbide-hydrALAZINE (BIDIL) 20-37.5 MG tablet Take 2 tablets by mouth in the morning and at bedtime. 120 tablet 6   metFORMIN (GLUCOPHAGE) 500 MG tablet 2 with breakfast and 1 with dinner 270 tablet 0   neomycin-polymyxin-hydrocortisone (CORTISPORIN) OTIC solution Apply 2-3 drops to the ingrown toenail site twice daily. Cover with band-aid. 10 mL 0   sennosides-docusate sodium (SENOKOT-S) 8.6-50 MG tablet Take 2 tablets by mouth daily. 180 tablet 3   spironolactone (ALDACTONE) 25 MG  tablet TAKE 1 TABLET(25 MG) BY MOUTH DAILY 90 tablet 1   atorvastatin (LIPITOR) 80 MG tablet Take 1 tablet (80 mg total) by mouth daily. 90 tablet 2   No facility-administered medications prior to visit.    Allergies  Allergen Reactions   Benicar [Olmesartan] Anaphylaxis and Swelling    Patient cannot name the site of swelling (??)   Ace Inhibitors Swelling    Patient cannot name the site of swelling (??)   Angiotensin Receptor Blockers Swelling    Patient cannot name the site of swelling (??)       Objective:    BP 133/76   Pulse 66   Ht 6' 2.5" (1.892 m)   Wt 187 lb 2 oz (84.9 kg)   SpO2 98%   BMI 23.70 kg/m  Wt Readings from Last 3 Encounters:  04/16/21 187 lb 2 oz (84.9 kg)  01/08/21 189 lb 6.4 oz (85.9 kg)  12/14/20 194 lb (88 kg)    Physical Exam Vitals and nursing note reviewed.  Constitutional:      Appearance: He is well-developed.  HENT:     Head: Normocephalic and atraumatic.  Cardiovascular:     Rate and Rhythm: Normal rate and regular rhythm.     Heart sounds: Normal heart sounds. No murmur heard.   No friction rub. No gallop.  Pulmonary:     Effort: Pulmonary effort is normal. No tachypnea or respiratory distress.     Breath sounds: Normal breath sounds. No decreased breath sounds, wheezing, rhonchi or rales.  Chest:     Chest wall: No tenderness.  Abdominal:     General: Bowel sounds are normal.     Palpations: Abdomen is soft.  Musculoskeletal:        General: Normal range of motion.     Cervical back: Normal range of motion.  Skin:    General: Skin is warm and dry.  Neurological:     Mental Status: He is alert and oriented to person, place, and time.     Coordination: Coordination normal.  Psychiatric:        Behavior: Behavior normal. Behavior is cooperative.        Thought Content: Thought content normal.        Judgment: Judgment normal.         Patient has been counseled extensively about nutrition and exercise as well as the  importance of adherence with medications and regular follow-up. The patient was given clear instructions to go to ER or return to medical center if symptoms don't improve, worsen or new problems develop. The patient verbalized understanding.   Follow-up: Return in about 3 months (around 07/15/2021).   [name], FNP-BC [facility] and [place], [place]   04/16/2021, 1:10 PM

## 2021-04-16 NOTE — Patient Instructions (Signed)
Placed in [facility] [address] [place], [place] [address] PH# [phone]

## 2021-05-11 ENCOUNTER — Encounter: Payer: Self-pay | Admitting: Gastroenterology

## 2021-05-27 ENCOUNTER — Other Ambulatory Visit: Payer: Self-pay | Admitting: Nurse Practitioner

## 2021-05-27 DIAGNOSIS — E1142 Type 2 diabetes mellitus with diabetic polyneuropathy: Secondary | ICD-10-CM

## 2021-05-31 ENCOUNTER — Ambulatory Visit: Payer: Medicaid Other | Admitting: Gastroenterology

## 2021-05-31 ENCOUNTER — Encounter: Payer: Self-pay | Admitting: Gastroenterology

## 2021-05-31 ENCOUNTER — Telehealth: Payer: Self-pay

## 2021-05-31 VITALS — BP 140/80 | HR 59 | Ht 74.5 in | Wt 189.0 lb

## 2021-05-31 DIAGNOSIS — K5909 Other constipation: Secondary | ICD-10-CM | POA: Diagnosis not present

## 2021-05-31 MED ORDER — POLYETHYLENE GLYCOL 3350 17 GM/SCOOP PO POWD: ORAL | 3 refills | Status: DC

## 2021-05-31 MED ORDER — GOLYTELY 236 G PO SOLR: 4000.0000 mL | Freq: Once | ORAL | 0 refills | Status: AC

## 2021-05-31 NOTE — Progress Notes (Signed)
[facility] Consult Note:  History: Ronald Marsh 05/31/2021  Referring provider: [name], NP  Reason for consult/chief complaint: Constipation (Onset on/off x years, no bleeding)   Subjective  HPI:  This is a very pleasant 61 year old man accompanied by his sister referred by primary care for chronic constipation.  He says it has occurred for years, though cannot be more specific than that.  He might go a week without a BM and often has to sit for prolonged periods and strain.  He has taken some Senokot-S lately without much improvement.  Denies rectal bleeding abdominal pain, nausea vomiting altered appetite or weight loss.  No known family history of colorectal cancer, no previous colon cancer screening.  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 He also has a history of CHF detailed in most recent cardiology office note of September 2022 (which was reviewed).  Selected data is included below, noting improvement in EF in the last 2 to 3 years.  ROS:  Review of Systems  Constitutional:  Positive for fatigue. Negative for appetite change and unexpected weight change.  HENT:  Negative for mouth sores and voice change.   Eyes:  Negative for pain and redness.  Respiratory:  Negative for cough and shortness of breath.   Cardiovascular:  Negative for chest pain and palpitations.  Genitourinary:  Negative for dysuria and hematuria.  Musculoskeletal:  Negative for arthralgias and myalgias.  Skin:  Negative for pallor and rash.  Neurological:  Negative for weakness and headaches.       Chronic right arm and leg weakness  Hematological:  Negative for adenopathy.    Past Medical History: Past Medical History:  Diagnosis Date   Diabetes mellitus without complication ([place])    Heart failure ([place])    Hepatitis C    History of CVA (cerebrovascular accident) 02/2010   Left  periventricular subcortical ischemic infarction // Residual RUE and RLE weakness   History of depression    surrounding original stroke - has since resolved (04/2012)   Hyperlipidemia    Hypertension    Internal carotid artery stenosis    BL and mild per CT angiogram (05/2010)   Stroke ([place]) 2012   Tobacco abuse      Past Surgical History: Past Surgical History:  Procedure Laterality Date   NO PAST SURGERIES       Family History: Family History  Problem Relation Age of Onset   Diabetes Mother        on dialysis   Heart failure Mother    Hypertension Mother    Hyperlipidemia Mother    Glaucoma Mother    Lung cancer Father    Diabetes Sister    Colon polyps Sister    Arthritis Sister    Hypertension Brother    Diabetes Brother    Pancreatic cancer Brother    Colon cancer Neg Hx    Esophageal cancer Neg Hx     Social History: Social History   Socioeconomic History   Marital status: Single    Spouse name: Not on file   Number of children: 1   Years of education: 11th grade   Highest education level: Not on file  Occupational History   Occupation: On disability    Comment: previously worked for [employer] until his stroke in 2011.   Tobacco Use   Smoking status: Former    Packs/day: 0.30  Years: 31.00    Pack years: 9.30    Types: Cigarettes    Start date: 04/25/1981    Quit date: 05/27/2021    Years since quitting: 0.0   Smokeless tobacco: Never  Vaping Use   Vaping Use: Never used  Substance and Sexual Activity   Alcohol use: Not Currently   Drug use: No    Comment: Former smoke crack    Sexual activity: Yes  Other Topics Concern   Not on file  Social History Narrative   Lives in [place] with wife.   Has been incarcerated multiple times, last time was in 2012 for 8 months.   Social Determinants of Radio broadcast assistant Strain: Not on file  Food Insecurity: No Food Insecurity   Worried About Charity fundraiser in the Last Year:  Never true   Ran Out of Food in the Last Year: Never true  Transportation Needs: Not on file  Physical Activity: Not on file  Stress: Not on file  Social Connections: Not on file    Allergies: Allergies  Allergen Reactions   Benicar [Olmesartan] Anaphylaxis and Swelling    Patient cannot name the site of swelling (??)   Ace Inhibitors Swelling    Patient cannot name the site of swelling (??)   Angiotensin Receptor Blockers Swelling    Patient cannot name the site of swelling (??)    Outpatient Meds: Current Outpatient Medications  Medication Sig Dispense Refill   Accu-Chek FastClix Lancets MISC Use as instructed. Inject into the skin once daily. E11.9 100 each 3   acetaminophen (TYLENOL) 500 MG tablet Take 1 tablet (500 mg total) by mouth every 6 (six) hours as needed for moderate pain. 30 tablet 0   amLODipine (NORVASC) 10 MG tablet TAKE 1 TABLET(10 MG) BY MOUTH DAILY 90 tablet 1   aspirin 81 MG tablet Take 1 tablet (81 mg total) by mouth daily. 30 tablet 5   atorvastatin (LIPITOR) 80 MG tablet Take 1 tablet (80 mg total) by mouth daily. 90 tablet 2   Blood Glucose Calibration (ACCU-CHEK GUIDE CONTROL) LIQD 1 each by In Vitro route once as needed for up to 1 dose. E11.9 1 each 0   Blood Pressure Monitor DEVI Please provide patient with insurance approved blood pressure monitor 1 Device 0   carvedilol (COREG) 12.5 MG tablet Take 1 tablet (12.5 mg total) by mouth 2 (two) times daily with a meal. 60 tablet 6   clopidogrel (PLAVIX) 75 MG tablet Take 1 tablet (75 mg total) by mouth daily. 90 tablet 1   dapagliflozin propanediol (FARXIGA) 10 MG TABS tablet Take 1 tablet (10 mg total) by mouth daily before breakfast. 30 tablet 6   gabapentin (NEURONTIN) 600 MG tablet Take 0.5-1 tablets (300-600 mg total) by mouth at bedtime. 90 tablet 0   glucose blood (ACCU-CHEK GUIDE) test strip Use as instructed. Check blood glucose by fingerstick once per day.  E11.9 100 each 12    isosorbide-hydrALAZINE (BIDIL) 20-37.5 MG tablet Take 2 tablets by mouth in the morning and at bedtime. 120 tablet 6   metFORMIN (GLUCOPHAGE) 500 MG tablet TAKE 2 TABLETS BY MOUTH WITH BREAKFAST AND 1 TABLET BY MOUTH WITH DINNER 270 tablet 0   neomycin-polymyxin-hydrocortisone (CORTISPORIN) OTIC solution Apply 2-3 drops to the ingrown toenail site twice daily. Cover with band-aid. 10 mL 0   polyethylene glycol (GOLYTELY) 236 g solution Take 4,000 mLs by mouth once for 1 dose. 4000 mL 0   polyethylene glycol  powder (GLYCOLAX/MIRALAX) 17 GM/SCOOP powder Take 17 grams once a day 255 g 3   sennosides-docusate sodium (SENOKOT-S) 8.6-50 MG tablet Take 2 tablets by mouth daily. 180 tablet 3   spironolactone (ALDACTONE) 25 MG tablet TAKE 1 TABLET(25 MG) BY MOUTH DAILY 90 tablet 1   No current facility-administered medications for this visit.      ___________________________________________________________________ Objective   Exam:  BP 140/80    Pulse (!) 59    Ht 6' 2.5" (1.892 m)    Wt 189 lb (85.7 kg)    BMI 23.94 kg/m  Wt Readings from Last 3 Encounters:  05/31/21 189 lb (85.7 kg)  04/16/21 187 lb 2 oz (84.9 kg)  01/08/21 189 lb 6.4 oz (85.9 kg)    General: He is generally well-appearing.  Conversational and pleasant, highly functional, ambulatory and steady on his feet. Eyes: sclera anicteric, no redness ENT: oral mucosa moist without lesions, no cervical or supraclavicular lymphadenopathy CV: RRR without murmur, S1/S2, no JVD, no peripheral edema Resp: clear to auscultation bilaterally, normal RR and effort noted GI: soft, no tenderness, with active bowel sounds. No guarding or palpable organomegaly noted. Skin; warm and dry, no rash or jaundice noted Neuro: awake, alert and oriented x 3.  Mild right arm and leg weakness  Labs:  CBC Latest Ref Rng & Units 04/06/2021 12/14/2020 01/27/2020  WBC 3.4 - 10.8 x10E3/uL 5.5 6.8 8.1  Hemoglobin 13.0 - 17.7 g/dL 12.5(L) 11.8(L) 13.3   Hematocrit 37.5 - 51.0 % 36.7(L) 34.7(L) 38.8  Platelets 150 - 450 x10E3/uL 212 175 207   CMP Latest Ref Rng & Units 04/06/2021 12/14/2020 05/20/2020  Glucose 70 - 99 mg/dL 158(H) 111(H) 132(H)  BUN 6 - 24 mg/dL 10 14 11   Creatinine 0.76 - 1.27 mg/dL 0.89 0.82 0.81  Sodium 134 - 144 mmol/L 140 144 137  Potassium 3.5 - 5.2 mmol/L 4.4 3.8 5.1  Chloride 96 - 106 mmol/L 103 106 101  CO2 20 - 29 mmol/L 25 23 21   Calcium 8.7 - 10.2 mg/dL 9.1 9.6 9.8  Total Protein 6.0 - 8.5 g/dL 6.8 7.3 7.7  Total Bilirubin 0.0 - 1.2 mg/dL 0.3 0.3 0.5  Alkaline Phos 44 - 121 IU/L 62 53 78  AST 0 - 40 IU/L 18 19 20   ALT 0 - 44 IU/L 14 10 14    No TSH checked  Last hemoglobin A1c 6.0 on 04/06/2021   Last echocardiogram September 2021:  1. Left ventricular ejection fraction, by estimation, is 45 to 50%. The  left ventricle has mildly decreased function. The left ventricle  demonstrates global hypokinesis. There is mild asymmetric left ventricular  hypertrophy of the posterior segment.  Left ventricular diastolic parameters are indeterminate. The average left  ventricular global longitudinal strain is -15.1 %. The global longitudinal  strain is upper normal.   2. Right ventricular systolic function is normal. The right ventricular  size is mildly enlarged. Tricuspid regurgitation signal is inadequate for  assessing PA pressure.   3. Right atrial size was moderately dilated.   4. Moderately thickened anterior MV leaflet with redundant chordae  tendinae. The mitral valve is normal in structure. Trivial mitral valve  regurgitation. No evidence of mitral stenosis.   5. The aortic valve is tricuspid. There is moderate calcification of the  aortic valve. There is moderate thickening of the aortic valve. Aortic  valve regurgitation is not visualized. Moderate aortic valve  sclerosis/calcification is present, without any  evidence of aortic stenosis.   6. Aortic dilatation  noted. There is borderline  dilatation of the aortic  root, measuring 37 mm.   7. The inferior vena cava is dilated in size with <50% respiratory  variability, suggesting right atrial pressure of 15 mmHg.    Assessment: Encounter Diagnosis  Name Primary?   Chronic constipation Yes    Constipation reportedly for years, may be related to insufficient dietary fiber, decreased physical activity, low-dose diuretic use, motility problem, less likely obstructive given its duration.  Plan:  MiraLAX once daily, prescription sent  Colonoscopy.  He was agreeable after discussion of procedure and risks.  The benefits and risks of the planned procedure were described in detail with the patient or (when appropriate) their health care proxy.  Risks were outlined as including, but not limited to, bleeding, infection, perforation, adverse medication reaction leading to cardiac or pulmonary decompensation, pancreatitis (if ERCP).  The limitation of incomplete mucosal visualization was also discussed.  No guarantees or warranties were given.  Patient at increased risk for cardiopulmonary complications of procedure due to medical comorbidities.  Hold Plavix 5 days prior to procedure, we discussed the small but real risk of cerebrovascular event during that hold.  He was agreeable, and we will send this to primary care for their opinion (his prescribing provider), though I suspect we will be agreeable given the duration of time since his CVA and neurologic stability.  Thank you for the courtesy of this consult.  Please call me with any questions or concerns.  Nelida Meuse III  CC: Referring provider noted above

## 2021-05-31 NOTE — Telephone Encounter (Signed)
NP Flemming please advise on holding Plavix. Patient is scheduled for 06-10-2021 and we'd like to have him hold Plavix for 5 days prior.

## 2021-05-31 NOTE — Telephone Encounter (Signed)
I believe I noted on the order form the need to contact his primary care provider, Geryl Rankins, NP, as they prescribed his Plavix.  HD

## 2021-05-31 NOTE — Telephone Encounter (Signed)
° °  Primary Cardiologist: None  Chart reviewed as part of pre-operative protocol coverage. Given past medical history and time since last visit, based on ACC/AHA guidelines, Ronald Marsh would be at acceptable risk for the planned procedure without further cardiovascular testing.   His Plavix is prescribed by his primary care provider for history of TIA/CVA.  Recommendations for holding his Plavix will need to come from the prescribing provider.  Patient was advised that if he develops new symptoms prior to surgery to contact our office to arrange a follow-up appointment.  He verbalized understanding.  I will route this recommendation to the requesting party via Epic fax function and remove from pre-op pool.  Please call with questions.  Jossie Ng. Misa Fedorko NP-C    05/31/2021, 1:16 PM [place] [address] Office [phone] Fax [phone]

## 2021-05-31 NOTE — Telephone Encounter (Signed)
[facility] Pre-operative Risk Assessment     Request for surgical clearance:     Endoscopy Procedure  What type of surgery is being performed?     Colonoscopy  When is this surgery scheduled?     06-10-2021  What type of clearance is required ?   Pharmacy  Are there any medications that need to be held prior to surgery and how long? Plavix, 5 days  Practice name and name of physician performing surgery?      [facility]  What is your office phone and fax number?      Phone- [phone]  [phone]  Anesthesia type (None, local, MAC, general) ?       MAC

## 2021-05-31 NOTE — Patient Instructions (Addendum)
If you are age 59 or older, your body mass index should be between 23-30. Your Body mass index is 23.94 kg/m. If this is out of the aforementioned range listed, please consider follow up with your Primary Care Provider.  If you are age 47 or younger, your body mass index should be between 19-25. Your Body mass index is 23.94 kg/m. If this is out of the aformentioned range listed, please consider follow up with your Primary Care Provider.   ________________________________________________________  The [facility] providers would like to encourage you to use [facility] to communicate with providers for non-urgent requests or questions.  Due to long hold times on the telephone, sending your provider a message by [facility] may be a faster and more efficient way to get a response.  Please allow 48 business hours for a response.  Please remember that this is for non-urgent requests.  _______________________________________________________  Ronald Marsh have been scheduled for a colonoscopy. Please follow written instructions given to you at your visit today.  Please pick up your prep supplies at the pharmacy within the next 1-3 days. If you use inhalers (even only as needed), please bring them with you on the day of your procedure.  Due to recent changes in healthcare laws, you may see the results of your imaging and laboratory studies on MyChart before your provider has had a chance to review them.  We understand that in some cases there may be results that are confusing or concerning to you. Not all laboratory results come back in the same time frame and the provider may be waiting for multiple results in order to interpret others.  Please give [place] 48 hours in order for your provider to thoroughly review all the results before contacting the office for clarification of your results.   It was a pleasure to see you today!  Thank you for trusting me with your gastrointestinal care!

## 2021-05-31 NOTE — Telephone Encounter (Signed)
Please advise 

## 2021-06-02 ENCOUNTER — Telehealth: Payer: Self-pay

## 2021-06-02 NOTE — Telephone Encounter (Signed)
We needs clearance to hold Plavix 5 days before patients scheduled procedure, he needs to start holding Friday or we will have to cancel the colonoscopy.    [facility] Pre-operative Risk Assessment     Request for surgical clearance:     Endoscopy Procedure  What type of surgery is being performed?     Colonoscopy  When is this surgery scheduled?     06-10-2021  What type of clearance is required ?   Pharmacy  Are there any medications that need to be held prior to surgery and how long? Plavix, 5 days  Practice name and name of physician performing surgery?      [facility]  What is your office phone and fax number?      Phone- [phone]  [phone]  Anesthesia type (None, local, MAC, general) ?       MAC

## 2021-06-02 NOTE — Telephone Encounter (Signed)
Hold Plavix for 5 days prior to colonoscopy. Restart 1 day after procedure.   Thanks!  Geryl Rankins FNP-BC

## 2021-06-03 NOTE — Telephone Encounter (Signed)
Left a detailed voicemail for the patient and one for his sister Kenney Houseman.

## 2021-06-03 NOTE — Telephone Encounter (Signed)
Please see clearance in previous message. Ok to hold 5 days prior to procedure per NP Raul Del. I eft a detailed message for both the patient and his sister Ronald Marsh.

## 2021-06-10 ENCOUNTER — Ambulatory Visit (AMBULATORY_SURGERY_CENTER): Payer: Medicaid Other | Admitting: Gastroenterology

## 2021-06-10 ENCOUNTER — Encounter: Payer: Self-pay | Admitting: Gastroenterology

## 2021-06-10 VITALS — BP 140/86 | HR 64 | Temp 97.1°F | Resp 19 | Ht 74.5 in | Wt 189.0 lb

## 2021-06-10 DIAGNOSIS — K5909 Other constipation: Secondary | ICD-10-CM

## 2021-06-10 DIAGNOSIS — D122 Benign neoplasm of ascending colon: Secondary | ICD-10-CM | POA: Diagnosis not present

## 2021-06-10 DIAGNOSIS — I509 Heart failure, unspecified: Secondary | ICD-10-CM | POA: Diagnosis not present

## 2021-06-10 DIAGNOSIS — D123 Benign neoplasm of transverse colon: Secondary | ICD-10-CM

## 2021-06-10 DIAGNOSIS — D125 Benign neoplasm of sigmoid colon: Secondary | ICD-10-CM

## 2021-06-10 MED ORDER — SODIUM CHLORIDE 0.9 % IV SOLN: 500.0000 mL | Freq: Once | INTRAVENOUS | Status: DC

## 2021-06-10 NOTE — Progress Notes (Signed)
Called to room to assist during endoscopic procedure.  Patient ID and intended procedure confirmed with present staff. Received instructions for my participation in the procedure from the performing physician.  

## 2021-06-10 NOTE — Op Note (Signed)
[place] Patient Name: Ronald Marsh Procedure Date: 06/10/2021 3:36 PM [name]: [id number] Endoscopist: [facility] [place] Loletha Carrow , MD Age: 61 Referring MD:  Date of Birth: 04/11/1961 Gender: Male Account #: [id number] Procedure:                Colonoscopy Indications:              Constipation Medicines:                Monitored Anesthesia Care Procedure:                Pre-Anesthesia Assessment:                           - Prior to the procedure, a History and Physical                            was performed, and patient medications and                            allergies were reviewed. The patient's tolerance of                            previous anesthesia was also reviewed. The risks                            and benefits of the procedure and the sedation                            options and risks were discussed with the patient.                            All questions were answered, and informed consent                            was obtained. Prior Anticoagulants: The patient has                            taken Plavix (clopidogrel), last dose was 5 days                            prior to procedure. ASA Grade Assessment: III - A                            patient with severe systemic disease. After                            reviewing the risks and benefits, the patient was                            deemed in satisfactory condition to undergo the                            procedure.  After obtaining informed consent, the colonoscope                            was passed under direct vision. Throughout the                            procedure, the patient's blood pressure, pulse, and                            oxygen saturations were monitored continuously. The                            Colonoscope was introduced through the anus and                            advanced to the the cecum, identified by                            appendiceal  orifice and ileocecal valve. The                            colonoscopy was performed with moderate difficulty                            due to a redundant colon and significant looping.                            Successful completion of the procedure was aided by                            using manual pressure and straightening and                            shortening the scope to obtain bowel loop                            reduction. The patient tolerated the procedure                            well. The quality of the bowel preparation was                            good. The ileocecal valve, appendiceal orifice, and                            rectum were photographed. The bowel preparation                            used was GoLYTELY. Scope In: 3:46:50 PM Scope Out: 4:25:48 PM Scope Withdrawal Time: 0 hours 33 minutes 39 seconds  Total Procedure Duration: 0 hours 38 minutes 58 seconds  Findings:                 The perianal and digital rectal examinations were  normal.                           Repeat examination of right colon under NBI                            performed.                           There was a small lipoma, in the ascending colon.                           Four sessile polyps were found in the transverse                            colon and ascending colon. The polyps were 4 to 8                            mm in size. These polyps were removed with a cold                            snare. Resection and retrieval were complete.                           A 15 mm polyp was found in the proximal transverse                            colon. The polyp was sessile. The polyp was removed                            with a piecemeal technique using a hot snare.                            Resection and retrieval were complete.                           Two semi-sessile polyps were found in the distal                            sigmoid colon.  The polyps were diminutive in size.                            These polyps were removed with a cold snare.                            Resection and retrieval were complete.                           The exam was otherwise without abnormality on                            direct and retroflexion views. Complications:            No immediate complications. Estimated Blood Loss:  Estimated blood loss was minimal. Impression:               - Small lipoma in the ascending colon.                           - Four 4 to 8 mm polyps in the transverse colon and                            in the ascending colon, removed with a cold snare.                            Resected and retrieved.                           - One 15 mm polyp in the proximal transverse colon,                            removed piecemeal using a hot snare. Resected and                            retrieved.                           - Two diminutive polyps in the distal sigmoid                            colon, removed with a cold snare. Resected and                            retrieved.                           - The examination was otherwise normal on direct                            and retroflexion views. Recommendation:           - Patient has a contact number available for                            emergencies. The signs and symptoms of potential                            delayed complications were discussed with the                            patient. Return to normal activities tomorrow.                            Written discharge instructions were provided to the                            patient.                           - Resume  previous diet.                           - Resume Plavix (clopidogrel) at prior dose in 2                            days.                           - Await pathology results.                           - Repeat colonoscopy is recommended for                            surveillance.  The colonoscopy date will be                            determined after pathology results from today's                            exam become available for review.                           - Return to my office at appointment to be                            scheduled regarding constipation. Continue miralax                            daily. Andelyn Spade L. Loletha Carrow, MD 06/10/2021 4:37:24 PM This report has been signed electronically.

## 2021-06-10 NOTE — Progress Notes (Signed)
Report given to PACU, vss 

## 2021-06-10 NOTE — Progress Notes (Signed)
No changes to clinical history since GI office visit on 05/31/21.  The patient is appropriate for an endoscopic procedure in the ambulatory setting.

## 2021-06-10 NOTE — Progress Notes (Signed)
Pt's states no medical or surgical changes since previsit or office visit.   VS [place]

## 2021-06-10 NOTE — Patient Instructions (Addendum)
Handout given for polyps.  RESUME PLAVIX AT PREVIOUS DOSE IN 2 DAYS, Saturday 06/12/21.   YOU HAD AN ENDOSCOPIC PROCEDURE TODAY AT [facility]:   Refer to the procedure report that was given to you for any specific questions about what was found during the examination.  If the procedure report does not answer your questions, please call your gastroenterologist to clarify.  If you requested that your care partner not be given the details of your procedure findings, then the procedure report has been included in a sealed envelope for you to review at your convenience later.  YOU SHOULD EXPECT: Some feelings of bloating in the abdomen. Passage of more gas than usual.  Walking can help get rid of the air that was put into your GI tract during the procedure and reduce the bloating. If you had a lower endoscopy (such as a colonoscopy or flexible sigmoidoscopy) you may notice spotting of blood in your stool or on the toilet paper. If you underwent a bowel prep for your procedure, you may not have a normal bowel movement for a few days.  Please Note:  You might notice some irritation and congestion in your nose or some drainage.  This is from the oxygen used during your procedure.  There is no need for concern and it should clear up in a day or so.  SYMPTOMS TO REPORT IMMEDIATELY:  Following lower endoscopy (colonoscopy):  Excessive amounts of blood in the stool  Significant tenderness or worsening of abdominal pains  Swelling of the abdomen that is new, acute  Fever of 100F or higher  For urgent or emergent issues, a gastroenterologist can be reached at any hour by calling [phone]. Do not use MyChart messaging for urgent concerns.    DIET:  We do recommend a small meal at first, but then you may proceed to your regular diet.  Drink plenty of fluids but you should avoid alcoholic beverages for 24 hours.  ACTIVITY:  You should plan to take it easy for the rest of today and you  should NOT DRIVE or use heavy machinery until tomorrow (because of the sedation medicines used during the test).    FOLLOW UP: Our staff will call the number listed on your records 48-72 hours following your procedure to check on you and address any questions or concerns that you may have regarding the information given to you following your procedure. If we do not reach you, we will leave a message.  We will attempt to reach you two times.  During this call, we will ask if you have developed any symptoms of COVID 19. If you develop any symptoms (ie: fever, flu-like symptoms, shortness of breath, cough etc.) before then, please call [phone].  If you test positive for Covid 19 in the 2 weeks post procedure, please call and report this information to [place].    If any biopsies were taken you will be contacted by phone or by letter within the next 1-3 weeks.  Please call us at [phone] if you have not heard about the biopsies in 3 weeks.    SIGNATURES/CONFIDENTIALITY: You and/or your care partner have signed paperwork which will be entered into your electronic medical record.  These signatures attest to the fact that that the information above on your After Visit Summary has been reviewed and is understood.  Full responsibility of the confidentiality of this discharge information lies with you and/or your care-partner.

## 2021-06-11 ENCOUNTER — Telehealth: Payer: Self-pay

## 2021-06-11 NOTE — Telephone Encounter (Signed)
Per 06/10/21 procedure report - Return to my office at appt to be scheduled regarding constipation.   Called and spoke with pt to set up his f/u appt with Dr. Loletha Carrow. Pt states that his sister brings him to his appts so he will have to check with her and then give [place] a call back to schedule. Pt verbalized understanding and had no concerns at the end of the call.

## 2021-06-14 ENCOUNTER — Other Ambulatory Visit: Payer: Self-pay | Admitting: Physician Assistant

## 2021-06-14 ENCOUNTER — Telehealth: Payer: Self-pay

## 2021-06-14 DIAGNOSIS — Z8673 Personal history of transient ischemic attack (TIA), and cerebral infarction without residual deficits: Secondary | ICD-10-CM

## 2021-06-14 NOTE — Telephone Encounter (Signed)
°  Follow up Call-  Call back number 06/10/2021  Post procedure Call Back phone  # [phone]  Permission to leave phone message Yes  Some recent data might be hidden     Patient questions:  Do you have a fever, pain , or abdominal swelling? No. Pain Score  0 *  Have you tolerated food without any problems? Yes.    Have you been able to return to your normal activities? Yes.    Do you have any questions about your discharge instructions: Diet   No. Medications  No. Follow up visit  No.  Do you have questions or concerns about your Care? No.  Actions: * If pain score is 4 or above: No action needed, pain <4.  Have you developed a fever since your procedure? No    2.   Have you had an respiratory symptoms (SOB or cough) since your procedure? no  3.   Have you tested positive for COVID 19 since your procedure no  4.   Have you had any family members/close contacts diagnosed with the COVID 19 since your procedure?  no   If yes to any of these questions please route to Joylene John, RN and Joella Prince, RN

## 2021-06-14 NOTE — Telephone Encounter (Signed)
Pt called back and the schedulers helped him scheduled his f/u appt on Wednesday, 07/14/21 at 9:20 am with Dr. Loletha Carrow.

## 2021-06-15 NOTE — Telephone Encounter (Signed)
Requested medications are due for refill today.  yes  Requested medications are on the active medications list.  yes  Last refill. 12/14/2020  Future visit scheduled.   yes  Notes to clinic.  Failed protocol d/t abnormal labs.    Requested Prescriptions  Pending Prescriptions Disp Refills   clopidogrel (PLAVIX) 75 MG tablet [Pharmacy Med Name: CLOPIDOGREL 75MG  TABLETS] 90 tablet 1    Sig: TAKE 1 TABLET(75 MG) BY MOUTH DAILY     Hematology: Antiplatelets - clopidogrel Failed - 06/14/2021 12:51 PM      Failed - HCT in normal range and within 180 days    Hematocrit  Date Value Ref Range Status  04/06/2021 36.7 (L) 37.5 - 51.0 % Final          Failed - HGB in normal range and within 180 days    Hemoglobin  Date Value Ref Range Status  04/06/2021 12.5 (L) 13.0 - 17.7 g/dL Final          Passed - PLT in normal range and within 180 days    Platelets  Date Value Ref Range Status  04/06/2021 212 150 - 450 x10E3/uL Final          Passed - Cr in normal range and within 360 days    Creat  Date Value Ref Range Status  10/22/2018 0.99 0.70 - 1.33 mg/dL Final    Comment:    For patients >24 years of age, the reference limit for Creatinine is approximately 13% higher for people identified as African-American. .    Creatinine, Ser  Date Value Ref Range Status  04/06/2021 0.89 0.76 - 1.27 mg/dL Final   Creatinine,U  Date Value Ref Range Status  02/27/2010  mg/dL Final   34.1 (NOTE)  Cutoff Values for Urine Drug Screen:        Drug Class           Cutoff (ng/mL)        Amphetamines            1000        Barbiturates             200        Cocaine Metabolites      300        Benzodiazepines          200        Methadone                 300        Opiates                 2000        Phencyclidine             25        Propoxyphene             300        Marijuana Metabolites     50  For medical purposes only.   Creatinine, Urine  Date Value Ref Range Status  02/25/2014 288.4  mg/dL Final    Comment:    No reference range established.          Passed - Valid encounter within last 6 months    Recent Outpatient Visits           2 months ago Essential hypertension   [place], NP   3 months ago Controlled type 2  diabetes mellitus with diabetic polyneuropathy, without long-term current use of insulin [facility])   [place], Vernia Buff, NP   6 months ago Essential hypertension   [place], [place] W, NP   1 year ago Controlled type 2 diabetes mellitus with diabetic polyneuropathy, without long-term current use of insulin [facility])   [place], Dionne Bucy, [place]   1 year ago Essential hypertension   [place], Vernia Buff, NP       Future Appointments             In 1 month [name], NP [place]

## 2021-06-16 ENCOUNTER — Encounter: Payer: Self-pay | Admitting: Gastroenterology

## 2021-06-30 ENCOUNTER — Other Ambulatory Visit: Payer: Self-pay | Admitting: Nurse Practitioner

## 2021-06-30 DIAGNOSIS — Z8673 Personal history of transient ischemic attack (TIA), and cerebral infarction without residual deficits: Secondary | ICD-10-CM

## 2021-06-30 NOTE — Telephone Encounter (Signed)
Requested medication (s) are due for refill today:   No  Requested medication (s) are on the active medication list:   Yes  Future visit scheduled:   Yes   Last ordered: 06/15/2021 #90, 0 refills  Returned because labs out of range per protocol.   Criteria not met.   Requested Prescriptions  Pending Prescriptions Disp Refills   clopidogrel (PLAVIX) 75 MG tablet [Pharmacy Med Name: CLOPIDOGREL 75MG TABLETS] 90 tablet 0    Sig: TAKE 1 TABLET(75 MG) BY MOUTH DAILY     Hematology: Antiplatelets - clopidogrel Failed - 06/30/2021  3:08 AM      Failed - HCT in normal range and within 180 days    Hematocrit  Date Value Ref Range Status  04/06/2021 36.7 (L) 37.5 - 51.0 % Final          Failed - HGB in normal range and within 180 days    Hemoglobin  Date Value Ref Range Status  04/06/2021 12.5 (L) 13.0 - 17.7 g/dL Final          Passed - PLT in normal range and within 180 days    Platelets  Date Value Ref Range Status  04/06/2021 212 150 - 450 x10E3/uL Final          Passed - Cr in normal range and within 360 days    Creat  Date Value Ref Range Status  10/22/2018 0.99 0.70 - 1.33 mg/dL Final    Comment:    For patients >73 years of age, the reference limit for Creatinine is approximately 13% higher for people identified as African-American. .    Creatinine, Ser  Date Value Ref Range Status  04/06/2021 0.89 0.76 - 1.27 mg/dL Final   Creatinine,U  Date Value Ref Range Status  02/27/2010  mg/dL Final   34.1 (NOTE)  Cutoff Values for Urine Drug Screen:        Drug Class           Cutoff (ng/mL)        Amphetamines            1000        Barbiturates             200        Cocaine Metabolites      300        Benzodiazepines          200        Methadone                 300        Opiates                 2000        Phencyclidine             25        Propoxyphene             300        Marijuana Metabolites     50  For medical purposes only.   Creatinine, Urine  Date Value  Ref Range Status  02/25/2014 288.4 mg/dL Final    Comment:    No reference range established.          Passed - Valid encounter within last 6 months    Recent Outpatient Visits           2 months ago Essential hypertension   [place]  [name], NP   3 months ago Controlled type 2 diabetes mellitus with diabetic polyneuropathy, without long-term current use of insulin [facility])   [place], Vernia Buff, NP   6 months ago Essential hypertension   [place], [place] W, NP   1 year ago Controlled type 2 diabetes mellitus with diabetic polyneuropathy, without long-term current use of insulin [facility])   [place], Dionne Bucy, [place]   1 year ago Essential hypertension   [place], Vernia Buff, NP       Future Appointments             In 1 week [name], NP [place]

## 2021-07-11 ENCOUNTER — Other Ambulatory Visit: Payer: Self-pay | Admitting: Nurse Practitioner

## 2021-07-11 DIAGNOSIS — I10 Essential (primary) hypertension: Secondary | ICD-10-CM

## 2021-07-12 ENCOUNTER — Ambulatory Visit: Payer: Medicaid Other | Admitting: Gastroenterology

## 2021-07-13 ENCOUNTER — Other Ambulatory Visit: Payer: Self-pay

## 2021-07-13 ENCOUNTER — Encounter: Payer: Self-pay | Admitting: Nurse Practitioner

## 2021-07-13 ENCOUNTER — Telehealth: Payer: Self-pay | Admitting: Nurse Practitioner

## 2021-07-13 ENCOUNTER — Ambulatory Visit: Payer: Medicaid Other | Attending: Nurse Practitioner | Admitting: Nurse Practitioner

## 2021-07-13 DIAGNOSIS — M792 Neuralgia and neuritis, unspecified: Secondary | ICD-10-CM | POA: Diagnosis not present

## 2021-07-13 DIAGNOSIS — I10 Essential (primary) hypertension: Secondary | ICD-10-CM | POA: Diagnosis not present

## 2021-07-13 DIAGNOSIS — I69398 Other sequelae of cerebral infarction: Secondary | ICD-10-CM

## 2021-07-13 MED ORDER — GABAPENTIN 600 MG PO TABS: 300.0000 mg | ORAL_TABLET | Freq: Every day | ORAL | 0 refills | Status: DC

## 2021-07-13 NOTE — Telephone Encounter (Signed)
Requested Prescriptions  ?Pending Prescriptions Disp Refills  ?? amLODipine (NORVASC) 10 MG tablet [Pharmacy Med Name: AMLODIPINE BESYLATE '10MG'$  TABLETS] 90 tablet 0  ?  Sig: TAKE 1 TABLET(10 MG) BY MOUTH DAILY  ?  ? Cardiovascular: Calcium Channel Blockers 2 Failed - 07/11/2021  2:20 PM  ?  ?  Failed - Last BP in normal range  ?  BP Readings from Last 1 Encounters:  ?06/10/21 140/86  ?   ?  ?  Passed - Last Heart Rate in normal range  ?  Pulse Readings from Last 1 Encounters:  ?06/10/21 64  ?   ?  ?  Passed - Valid encounter within last 6 months  ?  Recent Outpatient Visits   ?      ? 2 months ago Essential hypertension  ? [place], [place] W, NP  ? 3 months ago Controlled type 2 diabetes mellitus with diabetic polyneuropathy, without long-term current use of insulin ([place])  ? [place], [place] W, NP  ? 7 months ago Essential hypertension  ? [place], [place] W, NP  ? 1 year ago Controlled type 2 diabetes mellitus with diabetic polyneuropathy, without long-term current use of insulin ([place])  ? [place], [place], [place]  ? 1 year ago Essential hypertension  ? [place] [name], NP  ?  ?  ?Future Appointments   ?        ? Today [name], NP [place]  ?  ? ?  ?  ?  ? ?

## 2021-07-13 NOTE — Telephone Encounter (Signed)
NO answer. LVM [phone] ?

## 2021-07-13 NOTE — Progress Notes (Signed)
Virtual Visit via Telephone Note ?Due to national recommendations of social distancing due to [place] 19, telehealth visit is felt to be most appropriate for this patient at this time.  I discussed the limitations, risks, security and privacy concerns of performing an evaluation and management service by telephone and the availability of in person appointments. I also discussed with the patient that there may be a patient responsible charge related to this service. The patient expressed understanding and agreed to proceed.  ? ? ?I connected with Ronald Marsh on 07/13/21  at  10:30 AM EST  EDT by telephone and verified that I am speaking with the correct person using two identifiers. ? ?Location of Patient: ?Private Residence ?  ?Location of Provider: ?Scientist, research (physical sciences) and [employer] Office  ?  ?Persons participating in Telemedicine visit: ?Geryl Rankins FNP-BC ?Ronald Marsh  ?  ?History of Present Illness: ?Telemedicine visit for: DM and HTN ?He has a past medical history of DM2, Heart failure (EF 25 to 30%), Hepatitis C (treated), History of CVA (02/2010), History of depression, Hyperlipidemia, Hypertension, Internal carotid artery stenosis, Stroke (2012), and Tobacco abuse.  ?  ?He is being followed by Dr. Clayborne Dana office for his heart failure ?  ? ?+FOBT. S/P colonoscopy with multiple adenomatous polyps. Repeat colonoscopy recommended in 3 years.  ? ?HTN ?He has at BP monitoring device but does not monitor his blood pressure. I recommended he check his blood pressure at least 2-3 times per week. Endorses adherence taking amlodipine 10 mg daily, carvedilol 12.5 mg BID, bidil 20-37.5 mg 2 tabs BID and spironolactone 25 mg daily.  ?BP Readings from Last 3 Encounters:  ?06/10/21 140/86  ?05/31/21 140/80  ?04/16/21 133/76  ?  ?DM 2 ?Well controlled with farxiga 10 mg daily and metformin 1000 mg in am and 500 mg in pm. LDL at goal with high intensity statin.  ?Lab Results  ?Component Value Date  ? HGBA1C 6.0  (H) 04/06/2021  ?  ?Lab Results  ?Component Value Date  ? LDLCALC 54 12/14/2020  ?  ?Past Medical History:  ?Diagnosis Date  ? Diabetes mellitus without complication ([place])   ? Heart failure ([place])   ? Hepatitis C   ? History of CVA (cerebrovascular accident) 02/2010  ? Left periventricular subcortical ischemic infarction // Residual RUE and RLE weakness  ? History of depression   ? surrounding original stroke - has since resolved (04/2012)  ? Hyperlipidemia   ? Hypertension   ? Internal carotid artery stenosis   ? BL and mild per CT angiogram (05/2010)  ? Stroke [facility]) 2012  ? Tobacco abuse   ?  ?Past Surgical History:  ?Procedure Laterality Date  ? NO PAST SURGERIES    ?  ?Family History  ?Problem Relation Age of Onset  ? Diabetes Mother   ?     on dialysis  ? Heart failure Mother   ? Hypertension Mother   ? Hyperlipidemia Mother   ? Glaucoma Mother   ? Lung cancer Father   ? Diabetes Sister   ? Colon polyps Sister   ? Arthritis Sister   ? Hypertension Brother   ? Diabetes Brother   ? Pancreatic cancer Brother   ? Colon cancer Neg Hx   ? Esophageal cancer Neg Hx   ?  ?Social History  ? ?Socioeconomic History  ? Marital status: Single  ?  Spouse name: Not on Marsh  ? Number of children: 1  ? Years of education: 11th grade  ? Highest education  level: Not on Marsh  ?Occupational History  ? Occupation: On disability  ?  Comment: previously worked for [employer] until his stroke in 2011.   ?Tobacco Use  ? Smoking status: Former  ?  Packs/day: 0.30  ?  Years: 31.00  ?  Pack years: 9.30  ?  Types: Cigarettes  ?  Start date: 04/25/1981  ?  Quit date: 05/27/2021  ?  Years since quitting: 0.1  ? Smokeless tobacco: Never  ?Vaping Use  ? Vaping Use: Never used  ?Substance and Sexual Activity  ? Alcohol use: Not Currently  ? Drug use: No  ?  Comment: Former smoke crack   ? Sexual activity: Yes  ?Other Topics Concern  ? Not on Marsh  ?Social History Narrative  ? Lives in [place] with wife.  ? Has been incarcerated multiple  times, last time was in 2012 for 8 months.  ? ?Social Determinants of Health  ? ?Financial Resource Strain: Not on Marsh  ?Food Insecurity: No Food Insecurity  ? Worried About Charity fundraiser in the Last Year: Never true  ? Ran Out of Food in the Last Year: Never true  ?Transportation Needs: Not on Marsh  ?Physical Activity: Not on Marsh  ?Stress: Not on Marsh  ?Social Connections: Not on Marsh  ?  ? ?Observations/Objective: ?Awake, alert and oriented x 3 ? ? ?Review of Systems  ?Constitutional:  Negative for fever, malaise/fatigue and weight loss.  ?HENT: Negative.  Negative for nosebleeds.   ?Eyes: Negative.  Negative for blurred vision, double vision and photophobia.  ?Respiratory: Negative.  Negative for cough and shortness of breath.   ?Cardiovascular: Negative.  Negative for chest pain, palpitations and leg swelling.  ?Gastrointestinal: Negative.  Negative for heartburn, nausea and vomiting.  ?Musculoskeletal: Negative.  Negative for myalgias.  ?Neurological: Negative.  Negative for dizziness, focal weakness, seizures and headaches.  ?Psychiatric/Behavioral: Negative.  Negative for suicidal ideas.    ?Assessment and Plan: ?Diagnoses and all orders for this visit: ? ?Essential hypertension ?Continue all antihypertensives as prescribed.  ?Remember to bring in your blood pressure log with you for your follow up appointment.  ?DASH/Mediterranean Diets are healthier choices for HTN.   ? 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 ? ?  ? ?Follow Up Instructions ?Return in about 3 months (around 10/13/2021).  ? ?  ?I discussed the assessment and treatment plan with the patient. The patient was provided an opportunity to ask questions and all were answered. The patient agreed with the plan and demonstrated an understanding of the instructions. ?  ?The patient was advised to call back or  seek an in-person evaluation if the symptoms worsen or if the condition fails to improve as anticipated. ? ?I provided 12 minutes of non-face-to-face time during this encounter including median intraservice time, reviewing previous notes, labs, imaging, medications and explaining diagnosis and management. ? ?[name], FNP-BC  ?

## 2021-07-14 ENCOUNTER — Encounter: Payer: Self-pay | Admitting: Gastroenterology

## 2021-07-14 ENCOUNTER — Ambulatory Visit (INDEPENDENT_AMBULATORY_CARE_PROVIDER_SITE_OTHER): Payer: Medicaid Other | Admitting: Gastroenterology

## 2021-07-14 VITALS — BP 122/70 | HR 64 | Ht 74.0 in | Wt 199.0 lb

## 2021-07-14 DIAGNOSIS — K5909 Other constipation: Secondary | ICD-10-CM | POA: Diagnosis not present

## 2021-07-14 NOTE — Progress Notes (Signed)
? ? ? ?[facility] Progress Note ? ?Chief Complaint: Chronic constipation ? ?Subjective  ?History: ?Ronald Marsh follows up for his chronic constipation, having been seen in the office for this in January with my impressions and plan in that note. ?Colonoscopy February 2 was a complete exam with good preparation, 7 adenomatous polyps removed, including one at least 15 mm in the transverse.  He was advised to continue MiraLAX afterward. ? ? ?[name] was again here with his sister.  He reports that his bowel habits have been more regular ever since the colonoscopy.  His sister has been after him to increase his fruit and vegetable intake but he has been reluctant to do so.  He has increased his water intake and now rarely needs the MiraLAX. ? ?ROS: ?Cardiovascular:  no chest pain ?Respiratory: no dyspnea ? ?The patient's Past Medical, Family and Social History were reviewed and are on file in the EMR. ? ?Objective: ? ?Med list reviewed ? ?Current Outpatient Medications:  ?  Accu-Chek FastClix Lancets MISC, Use as instructed. Inject into the skin once daily. E11.9, Disp: 100 each, Rfl: 3 ?  acetaminophen (TYLENOL) 500 MG tablet, Take 1 tablet (500 mg total) by mouth every 6 (six) hours as needed for moderate pain., Disp: 30 tablet, Rfl: 0 ?  amLODipine (NORVASC) 10 MG tablet, TAKE 1 TABLET(10 MG) BY MOUTH DAILY, Disp: 90 tablet, Rfl: 0 ?  aspirin 81 MG tablet, Take 1 tablet (81 mg total) by mouth daily., Disp: 30 tablet, Rfl: 5 ?  atorvastatin (LIPITOR) 80 MG tablet, Take 1 tablet (80 mg total) by mouth daily., Disp: 90 tablet, Rfl: 2 ?  Blood Glucose Calibration (ACCU-CHEK GUIDE CONTROL) LIQD, 1 each by In Vitro route once as needed for up to 1 dose. E11.9, Disp: 1 each, Rfl: 0 ?  Blood Pressure Monitor DEVI, Please provide patient with insurance approved blood pressure monitor, Disp: 1 Device, Rfl: 0 ?  carvedilol (COREG) 12.5 MG tablet, Take 1 tablet (12.5 mg total) by mouth 2 (two) times daily with a meal., Disp:  60 tablet, Rfl: 6 ?  clopidogrel (PLAVIX) 75 MG tablet, TAKE 1 TABLET(75 MG) BY MOUTH DAILY, Disp: 90 tablet, Rfl: 0 ?  dapagliflozin propanediol (FARXIGA) 10 MG TABS tablet, Take 1 tablet (10 mg total) by mouth daily before breakfast., Disp: 30 tablet, Rfl: 6 ?  gabapentin (NEURONTIN) 600 MG tablet, Take 0.5-1 tablets (300-600 mg total) by mouth at bedtime., Disp: 90 tablet, Rfl: 0 ?  glucose blood (ACCU-CHEK GUIDE) test strip, Use as instructed. Check blood glucose by fingerstick once per day.  E11.9, Disp: 100 each, Rfl: 12 ?  isosorbide-hydrALAZINE (BIDIL) 20-37.5 MG tablet, Take 2 tablets by mouth in the morning and at bedtime., Disp: 120 tablet, Rfl: 6 ?  metFORMIN (GLUCOPHAGE) 500 MG tablet, TAKE 2 TABLETS BY MOUTH WITH BREAKFAST AND 1 TABLET BY MOUTH WITH DINNER, Disp: 270 tablet, Rfl: 0 ?  neomycin-polymyxin-hydrocortisone (CORTISPORIN) OTIC solution, Apply 2-3 drops to the ingrown toenail site twice daily. Cover with band-aid., Disp: 10 mL, Rfl: 0 ?  polyethylene glycol powder (GLYCOLAX/MIRALAX) 17 GM/SCOOP powder, Take 17 grams once a day, Disp: 255 g, Rfl: 3 ?  spironolactone (ALDACTONE) 25 MG tablet, TAKE 1 TABLET(25 MG) BY MOUTH DAILY, Disp: 90 tablet, Rfl: 1 ? ? ?Vital signs in last 24 hrs: ?Vitals:  ? 07/14/21 0920  ?BP: 122/70  ?Pulse: 64  ? ?Wt Readings from Last 3 Encounters:  ?07/14/21 199 lb (90.3 kg)  ?06/10/21 189 lb (85.7 kg)  ?  05/31/21 189 lb (85.7 kg)  ?  ?Physical Exam ? ?Well-appearing ?Cardiac: RRR without murmurs, S1S2 heard, no peripheral edema ?Pulm: clear to auscultation bilaterally, normal RR and effort noted ?Abdomen: soft, no tenderness, with active bowel sounds. No guarding or palpable hepatosplenomegaly ? ?Labs: ? ? ?___________________________________________ ?Radiologic studies: ? ? ?____________________________________________ ?Other: ? ? ?_____________________________________________ ?Assessment & Plan  ?Assessment: ?Encounter Diagnosis  ?Name Primary?  ? Chronic  constipation Yes  ? ? ?Constipation reportedly for years, may be related to insufficient dietary fiber, decreased physical activity, low-dose diuretic use, motility problem, is clearly nonobstructive based on colonoscopy findings. ? ?Lately improved, encouraged to increase dietary fiber intake, water and physical activity best he can. ?MiraLAX can be used as needed. ?Plan: ?He will also see me as needed. ?Colonoscopy recall as scheduled. ? ?Nelida Meuse III ? ?

## 2021-07-14 NOTE — Patient Instructions (Signed)
If you are age 59 or older, your body mass index should be between 23-30. Your Body mass index is 25.55 kg/m?Marland Kitchen If this is out of the aforementioned range listed, please consider follow up with your Primary Care Provider. ? ?If you are age 77 or younger, your body mass index should be between 19-25. Your Body mass index is 25.55 kg/m?Marland Kitchen If this is out of the aformentioned range listed, please consider follow up with your Primary Care Provider.  ? ?________________________________________________________ ? ?The [facility] providers would like to encourage you to use [facility] to communicate with providers for non-urgent requests or questions.  Due to long hold times on the telephone, sending your provider a message by [facility] may be a faster and more efficient way to get a response.  Please allow 48 business hours for a response.  Please remember that this is for non-urgent requests.  ?_______________________________________________________ ? ?Follow up as needed.  ? ?It was a pleasure to see you today! ? ?Thank you for trusting me with your gastrointestinal care!   ?  ?

## 2021-07-16 ENCOUNTER — Ambulatory Visit: Payer: Medicaid Other | Admitting: Nurse Practitioner

## 2021-08-11 ENCOUNTER — Other Ambulatory Visit: Payer: Self-pay | Admitting: Nurse Practitioner

## 2021-08-11 ENCOUNTER — Other Ambulatory Visit: Payer: Self-pay | Admitting: Family Medicine

## 2021-08-11 DIAGNOSIS — I5033 Acute on chronic diastolic (congestive) heart failure: Secondary | ICD-10-CM

## 2021-08-20 ENCOUNTER — Other Ambulatory Visit (HOSPITAL_COMMUNITY): Payer: Self-pay

## 2021-08-20 MED ORDER — ISOSORB DINITRATE-HYDRALAZINE 20-37.5 MG PO TABS: 2.0000 | ORAL_TABLET | Freq: Two times a day (BID) | ORAL | 1 refills | Status: DC

## 2021-09-03 ENCOUNTER — Other Ambulatory Visit: Payer: Self-pay | Admitting: Family Medicine

## 2021-09-03 DIAGNOSIS — E1142 Type 2 diabetes mellitus with diabetic polyneuropathy: Secondary | ICD-10-CM

## 2021-09-20 ENCOUNTER — Encounter: Payer: Self-pay | Admitting: Nurse Practitioner

## 2021-09-20 ENCOUNTER — Ambulatory Visit: Payer: Medicaid Other | Attending: Nurse Practitioner | Admitting: Nurse Practitioner

## 2021-09-20 VITALS — BP 134/78 | HR 67 | Temp 98.6°F | Ht 74.0 in | Wt 208.8 lb

## 2021-09-20 DIAGNOSIS — E1142 Type 2 diabetes mellitus with diabetic polyneuropathy: Secondary | ICD-10-CM | POA: Diagnosis not present

## 2021-09-20 DIAGNOSIS — Z8673 Personal history of transient ischemic attack (TIA), and cerebral infarction without residual deficits: Secondary | ICD-10-CM

## 2021-09-20 DIAGNOSIS — I10 Essential (primary) hypertension: Secondary | ICD-10-CM

## 2021-09-20 DIAGNOSIS — E7800 Pure hypercholesterolemia, unspecified: Secondary | ICD-10-CM | POA: Diagnosis not present

## 2021-09-20 DIAGNOSIS — D649 Anemia, unspecified: Secondary | ICD-10-CM | POA: Diagnosis not present

## 2021-09-20 DIAGNOSIS — I5033 Acute on chronic diastolic (congestive) heart failure: Secondary | ICD-10-CM

## 2021-09-20 MED ORDER — METFORMIN HCL 500 MG PO TABS: 500.0000 mg | ORAL_TABLET | Freq: Two times a day (BID) | ORAL | 1 refills | Status: DC

## 2021-09-20 MED ORDER — AMLODIPINE BESYLATE 10 MG PO TABS: 10.0000 mg | ORAL_TABLET | Freq: Every day | ORAL | 1 refills | Status: DC

## 2021-09-20 MED ORDER — ISOSORB DINITRATE-HYDRALAZINE 20-37.5 MG PO TABS: 2.0000 | ORAL_TABLET | Freq: Two times a day (BID) | ORAL | 1 refills | Status: DC

## 2021-09-20 MED ORDER — CLOPIDOGREL BISULFATE 75 MG PO TABS: 75.0000 mg | ORAL_TABLET | Freq: Every day | ORAL | 1 refills | Status: AC

## 2021-09-20 MED ORDER — CARVEDILOL 12.5 MG PO TABS: 12.5000 mg | ORAL_TABLET | Freq: Two times a day (BID) | ORAL | 1 refills | Status: DC

## 2021-09-20 MED ORDER — DAPAGLIFLOZIN PROPANEDIOL 10 MG PO TABS: 10.0000 mg | ORAL_TABLET | Freq: Every day | ORAL | 1 refills | Status: DC

## 2021-09-20 NOTE — Progress Notes (Signed)
Assessment & Plan:  [name] was seen today for hypertension.  Diagnoses and all orders for this visit:  Essential hypertension -     amLODipine (NORVASC) 10 MG tablet; Take 1 tablet (10 mg total) by mouth daily. -     CMP14+EGFR  Pure hypercholesterolemia -     Lipid panel  Acute on chronic diastolic CHF (congestive heart failure)  Follow up with Cardiology 12-2021 -     carvedilol (COREG) 12.5 MG tablet; Take 1 tablet (12.5 mg total) by mouth 2 (two) times daily with a meal. -     isosorbide-hydrALAZINE (BIDIL) 20-37.5 MG tablet; Take 2 tablets by mouth in the morning and at bedtime. Please schedule an  appointment  Controlled type 2 diabetes mellitus with diabetic polyneuropathy, without long-term current use of insulin (HCC) -     Hemoglobin A1c -     dapagliflozin propanediol (FARXIGA) 10 MG TABS tablet; Take 1 tablet (10 mg total) by mouth daily before breakfast. -     metFORMIN (GLUCOPHAGE) 500 MG tablet; Take 1 tablet (500 mg total) by mouth 2 (two) times daily with a meal. TAKE 2 TABLETS BY MOUTH EVERY MORNING WITH BREAKFAST THEN 1 TABLET BY MOUTH WITH DINNER -     CMP14+EGFR -     Microalbumin / creatinine urine ratio  History of CVA (cerebrovascular accident) -     clopidogrel (PLAVIX) 75 MG tablet; Take 1 tablet (75 mg total) by mouth daily.  Anemia, unspecified type -     CBC    Patient has been counseled on age-appropriate routine health concerns for screening and prevention. These are reviewed and up-to-date. Referrals have been placed accordingly. Immunizations are up-to-date or declined.    Subjective:   Chief Complaint  Patient presents with   Hypertension   HPI Ronald Marsh 61 y.o. male presents to office today for follow up.    H has a past medical history of DM2, Heart failure, Hepatitis C, History of CVA (02/2010), History of depression, Hyperlipidemia, Hypertension, Internal carotid artery stenosis, substance abuse (tobacco, cocaine) Stroke on  plavix (2012), and Tobacco abuse.   ED Requesting medication for ED. Unfortunately he is taking isosorbide daily and this is contraindicated with viagra, cialis or levitra.  Has difficulty maintaining and achieving an erection. Onset a few years ago. Lab Results  Component Value Date   PSA1 3.8 04/06/2021      DM 2 Well controlled with farxiga 10 mg daily and metformin 500 mg BID. Lipids controlled with high intensity statin.  Lab Results  Component Value Date   HGBA1C 6.4 (H) 09/20/2021    Lab Results  Component Value Date   LDLCALC 61 09/20/2021   HTN Blood pressure well controlled with bidil 20-37.5 mg BID, carvedilol 12.5 mg BID, spironolactone 25 mg daily and amlodipine 10 mg daily. He does not monitor his blood pressure at home. Still has not totally quit smoking. He does not drink alcohol or consume illicit substances.  BP Readings from Last 3 Encounters:  09/20/21 134/78  07/14/21 122/70  06/10/21 140/86    Review of Systems  Constitutional:  Negative for fever, malaise/fatigue and weight loss.  HENT: Negative.  Negative for nosebleeds.   Eyes: Negative.  Negative for blurred vision, double vision and photophobia.  Respiratory: Negative.  Negative for cough and shortness of breath.   Cardiovascular: Negative.  Negative for chest pain, palpitations and leg swelling.  Gastrointestinal: Negative.  Negative for heartburn, nausea and vomiting.  Musculoskeletal: Negative.  Negative  for myalgias.  Neurological: Negative.  Negative for dizziness, focal weakness, seizures and headaches.  Psychiatric/Behavioral: Negative.  Negative for suicidal ideas.    Past Medical History:  Diagnosis Date   Diabetes mellitus without complication ([place])    Heart failure ([place])    Hepatitis C    History of CVA (cerebrovascular accident) 02/2010   Left periventricular subcortical ischemic infarction // Residual RUE and RLE weakness   History of depression    surrounding original stroke -  has since resolved (04/2012)   Hyperlipidemia    Hypertension    Internal carotid artery stenosis    BL and mild per CT angiogram (05/2010)   Stroke ([place]) 2012   Tobacco abuse     Past Surgical History:  Procedure Laterality Date   NO PAST SURGERIES      Family History  Problem Relation Age of Onset   Diabetes Mother        on dialysis   Heart failure Mother    Hypertension Mother    Hyperlipidemia Mother    Glaucoma Mother    Lung cancer Father    Diabetes Sister    Colon polyps Sister    Arthritis Sister    Hypertension Brother    Diabetes Brother    Pancreatic cancer Brother    Colon cancer Neg Hx    Esophageal cancer Neg Hx     Social History Reviewed with no changes to be made today.   Outpatient Medications Prior to Visit  Medication Sig Dispense Refill   Accu-Chek FastClix Lancets MISC Use as instructed. Inject into the skin once daily. E11.9 100 each 3   acetaminophen (TYLENOL) 500 MG tablet Take 1 tablet (500 mg total) by mouth every 6 (six) hours as needed for moderate pain. 30 tablet 0   aspirin 81 MG tablet Take 1 tablet (81 mg total) by mouth daily. 30 tablet 5   Blood Glucose Calibration (ACCU-CHEK GUIDE CONTROL) LIQD 1 each by In Vitro route once as needed for up to 1 dose. E11.9 1 each 0   Blood Pressure Monitor DEVI Please provide patient with insurance approved blood pressure monitor 1 Device 0   gabapentin (NEURONTIN) 600 MG tablet Take 0.5-1 tablets (300-600 mg total) by mouth at bedtime. 90 tablet 0   glucose blood (ACCU-CHEK GUIDE) test strip Use as instructed. Check blood glucose by fingerstick once per day.  E11.9 100 each 12   neomycin-polymyxin-hydrocortisone (CORTISPORIN) OTIC solution Apply 2-3 drops to the ingrown toenail site twice daily. Cover with band-aid. 10 mL 0   polyethylene glycol powder (GLYCOLAX/MIRALAX) 17 GM/SCOOP powder Take 17 grams once a day 255 g 3   spironolactone (ALDACTONE) 25 MG tablet TAKE 1 TABLET(25 MG) BY MOUTH DAILY  90 tablet 1   amLODipine (NORVASC) 10 MG tablet TAKE 1 TABLET(10 MG) BY MOUTH DAILY 90 tablet 0   carvedilol (COREG) 12.5 MG tablet TAKE 1 TABLET(12.5 MG) BY MOUTH TWICE DAILY WITH A MEAL 180 tablet 0   clopidogrel (PLAVIX) 75 MG tablet TAKE 1 TABLET(75 MG) BY MOUTH DAILY 90 tablet 0   dapagliflozin propanediol (FARXIGA) 10 MG TABS tablet Take 1 tablet (10 mg total) by mouth daily before breakfast. 30 tablet 6   isosorbide-hydrALAZINE (BIDIL) 20-37.5 MG tablet Take 2 tablets by mouth in the morning and at bedtime. Please schedule an  appointment 120 tablet 1   metFORMIN (GLUCOPHAGE) 500 MG tablet TAKE 2 TABLETS BY MOUTH EVERY MORNING WITH BREAKFAST THEN 1 TABLET BY MOUTH WITH DINNER 270  tablet 0   atorvastatin (LIPITOR) 80 MG tablet Take 1 tablet (80 mg total) by mouth daily. 90 tablet 2   No facility-administered medications prior to visit.    Allergies  Allergen Reactions   Benicar [Olmesartan] Anaphylaxis and Swelling    Patient cannot name the site of swelling (??)   Ace Inhibitors Swelling    Patient cannot name the site of swelling (??)   Angiotensin Receptor Blockers Swelling    Patient cannot name the site of swelling (??)       Objective:    BP 134/78 (BP Location: Left Arm, Patient Position: Sitting, Cuff Size: Large)   Pulse 67   Temp 98.6 F (37 C) (Oral)   Ht _0  (1.88 m)   Wt 208 lb 12.8 oz (94.7 kg)   SpO2 95%   BMI 26.81 kg/m  Wt Readings from Last 3 Encounters:  09/20/21 208 lb 12.8 oz (94.7 kg)  07/14/21 199 lb (90.3 kg)  06/10/21 189 lb (85.7 kg)    Physical Exam Vitals and nursing note reviewed.  Constitutional:      Appearance: He is well-developed.  HENT:     Head: Normocephalic and atraumatic.  Cardiovascular:     Rate and Rhythm: Normal rate and regular rhythm.     Heart sounds: Normal heart sounds. No murmur heard.   No friction rub. No gallop.  Pulmonary:     Effort: Pulmonary effort is normal. No tachypnea or respiratory distress.      Breath sounds: Normal breath sounds. No decreased breath sounds, wheezing, rhonchi or rales.  Chest:     Chest wall: No tenderness.  Abdominal:     General: Bowel sounds are normal.     Palpations: Abdomen is soft.  Musculoskeletal:        General: Normal range of motion.     Cervical back: Normal range of motion.  Skin:    General: Skin is warm and dry.  Neurological:     Mental Status: He is alert and oriented to person, place, and time.     Coordination: Coordination normal.  Psychiatric:        Behavior: Behavior normal. Behavior is cooperative.        Thought Content: Thought content normal.        Judgment: Judgment normal.         Patient has been counseled extensively about nutrition and exercise as well as the importance of adherence with medications and regular follow-up. The patient was given clear instructions to go to ER or return to medical center if symptoms don't improve, worsen or new problems develop. The patient verbalized understanding.   Follow-up: Return in about 3 months (around 12/21/2021).   [name], FNP-BC [facility] and [place], [place]   09/30/2021, 7:27 PM

## 2021-09-21 LAB — CMP14+EGFR
ALT: 16 IU/L (ref 0–44)
AST: 18 IU/L (ref 0–40)
Albumin/Globulin Ratio: 1.5 (ref 1.2–2.2)
Albumin: 4.6 g/dL (ref 3.8–4.9)
Alkaline Phosphatase: 65 IU/L (ref 44–121)
BUN/Creatinine Ratio: 19 (ref 9–20)
BUN: 16 mg/dL (ref 6–24)
Bilirubin Total: 0.4 mg/dL (ref 0.0–1.2)
CO2: 21 mmol/L (ref 20–29)
Calcium: 9.4 mg/dL (ref 8.7–10.2)
Chloride: 104 mmol/L (ref 96–106)
Creatinine, Ser: 0.86 mg/dL (ref 0.76–1.27)
Globulin, Total: 3 g/dL (ref 1.5–4.5)
Glucose: 91 mg/dL (ref 70–99)
Potassium: 4.3 mmol/L (ref 3.5–5.2)
Sodium: 141 mmol/L (ref 134–144)
Total Protein: 7.6 g/dL (ref 6.0–8.5)
eGFR: 100 mL/min/{1.73_m2} (ref >59)

## 2021-09-21 LAB — MICROALBUMIN / CREATININE URINE RATIO
Creatinine, Urine: 155.7 mg/dL
Microalb/Creat Ratio: 7 mg/g creat (ref 0–29)
Microalbumin, Urine: 11 ug/mL

## 2021-09-21 LAB — LIPID PANEL
Chol/HDL Ratio: 3.1 ratio (ref 0.0–5.0)
Cholesterol, Total: 110 mg/dL (ref 100–199)
HDL: 36 mg/dL — ABNORMAL LOW (ref >39)
LDL Chol Calc (NIH): 61 mg/dL (ref 0–99)
Triglycerides: 59 mg/dL (ref 0–149)
VLDL Cholesterol Cal: 13 mg/dL (ref 5–40)

## 2021-09-21 LAB — HEMOGLOBIN A1C
Est. average glucose Bld gHb Est-mCnc: 137 mg/dL
Hgb A1c MFr Bld: 6.4 % — ABNORMAL HIGH (ref 4.8–5.6)

## 2021-09-21 LAB — CBC
Hematocrit: 38 % (ref 37.5–51.0)
Hemoglobin: 12.7 g/dL — ABNORMAL LOW (ref 13.0–17.7)
MCH: 30.6 pg (ref 26.6–33.0)
MCHC: 33.4 g/dL (ref 31.5–35.7)
MCV: 92 fL (ref 79–97)
Platelets: 165 10*3/uL (ref 150–450)
RBC: 4.15 x10E6/uL (ref 4.14–5.80)
RDW: 13.2 % (ref 11.6–15.4)
WBC: 7.1 10*3/uL (ref 3.4–10.8)

## 2021-09-22 ENCOUNTER — Ambulatory Visit: Payer: Self-pay

## 2021-09-22 NOTE — Telephone Encounter (Signed)
?  Chief Complaint: medication request ?Symptoms: Trouble with getting and maintaining an erection ?Frequency:  ?Pertinent Negatives: NA ?Disposition: '[]'$ ED /'[]'$ Urgent Care (no appt availability in office) / '[]'$ Appointment(In office/virtual)/ '[]'$  [facility]/ '[]'$ Home Care/ '[]'$ Refused Recommended Disposition /'[]'$ [facility]/ '[x]'$  Follow-up with PCP ?Additional Notes: pt had OV on 09/20/21 and spoke with Zelda, PCP about this issue and state he was told she would send something in. Pt checked with pharmacy and they told him they didn't have anything on file for this. Pt is asking if medication can be sent to pharmacy. Advised him I would send message to provider.  ? ? ?Summary: rx concern  ? The patient has been notified by their pharmacy that they didn't receive their "colon medication"  ? ?The patient is uncertain of the name of the needed medication and would like to discuss their concerns further when possible  ? ?Please contact when available   ?  ? ?Reason for Disposition ? Prescription request for new medicine (not a refill) ? ?Answer Assessment - Initial Assessment Questions ?1. NAME of MEDICATION: "What medicine are you calling about?" ?    Medication for erection ?2. QUESTION: "What is your question?" (e.g., double dose of medicine, side effect) ?    Was suppose to be prescribed and never sent to pharmacy ?3. PRESCRIBING HCP: "Who prescribed it?" Reason: if prescribed by specialist, call should be referred to that group. ?    Zelda, NP ?4. SYMPTOMS: "Do you have any symptoms?" ?    Trouble with getting and maintaining an erection ? ?Protocols used: Medication Question Call-A-AH ? ?

## 2021-09-23 ENCOUNTER — Telehealth: Payer: Self-pay | Admitting: Nurse Practitioner

## 2021-09-23 NOTE — Telephone Encounter (Signed)
Will forward to provider  

## 2021-09-23 NOTE — Telephone Encounter (Signed)
Copied from [place] [id number]. Topic: General - Inquiry >> Sep 23, 2021  3:49 PM McGill, Nelva Bush wrote: Reason for CRM: Pt is calling back to follow up on rx request that was discussed on OV 09/20/21.  Pt spoke with NT on Sep 22, 2021.  Pt is requesting a call back.

## 2021-09-24 NOTE — Telephone Encounter (Signed)
Patient request alternative to Viagra.  Please advise.

## 2021-09-25 NOTE — Telephone Encounter (Signed)
Levitra and Cialis are contraindicated as well with imdur (isosorbide)

## 2021-09-28 NOTE — Telephone Encounter (Signed)
Patient aware of message per PCP regarding  Viagra, Levitra,  and Cialis being contraindicated  with imdur (isosorbide).

## 2021-09-30 ENCOUNTER — Encounter: Payer: Self-pay | Admitting: Nurse Practitioner

## 2021-10-12 ENCOUNTER — Other Ambulatory Visit: Payer: Self-pay | Admitting: Nurse Practitioner

## 2021-10-12 DIAGNOSIS — E7800 Pure hypercholesterolemia, unspecified: Secondary | ICD-10-CM

## 2021-10-13 NOTE — Telephone Encounter (Signed)
Requested Prescriptions  Pending Prescriptions Disp Refills  . atorvastatin (LIPITOR) 80 MG tablet [Pharmacy Med Name: ATORVASTATIN '80MG'$  TABLETS] 90 tablet 2    Sig: TAKE 1 TABLET(80 MG) BY MOUTH DAILY     Cardiovascular:  Antilipid - Statins Failed - 10/12/2021  7:27 PM      Failed - Lipid Panel in normal range within the last 12 months    Cholesterol, Total  Date Value Ref Range Status  09/20/2021 110 100 - 199 mg/dL Final   LDL Chol Calc (NIH)  Date Value Ref Range Status  09/20/2021 61 0 - 99 mg/dL Final   HDL  Date Value Ref Range Status  09/20/2021 36 (L) >39 mg/dL Final   Triglycerides  Date Value Ref Range Status  09/20/2021 59 0 - 149 mg/dL Final         Passed - Patient is not pregnant      Passed - Valid encounter within last 12 months    Recent Outpatient Visits          3 weeks ago Essential hypertension   [place], Vernia Buff, NP   3 months ago Essential hypertension   [place], Vernia Buff, NP   6 months ago Essential hypertension   [place], [place] W, NP   7 months ago Controlled type 2 diabetes mellitus with diabetic polyneuropathy, without long-term current use of insulin [facility])   [place], Vernia Buff, NP   10 months ago Essential hypertension   [place], Vernia Buff, NP      Future Appointments            In 2 months [name], NP [place]

## 2021-10-22 ENCOUNTER — Other Ambulatory Visit (HOSPITAL_COMMUNITY): Payer: Self-pay | Admitting: Internal Medicine

## 2021-10-22 DIAGNOSIS — I5033 Acute on chronic diastolic (congestive) heart failure: Secondary | ICD-10-CM

## 2021-12-13 ENCOUNTER — Other Ambulatory Visit: Payer: Self-pay

## 2021-12-13 ENCOUNTER — Emergency Department (HOSPITAL_COMMUNITY)
Admission: EM | Admit: 2021-12-13 | Discharge: 2021-12-13 | Disposition: A | Discharge status: Home / Self Care | Payer: Medicaid Other | Attending: Emergency Medicine | Admitting: Emergency Medicine

## 2021-12-13 ENCOUNTER — Encounter (HOSPITAL_COMMUNITY): Payer: Self-pay | Admitting: Emergency Medicine

## 2021-12-13 DIAGNOSIS — L02212 Cutaneous abscess of back [any part, except buttock]: Secondary | ICD-10-CM | POA: Diagnosis not present

## 2021-12-13 DIAGNOSIS — Z79899 Other long term (current) drug therapy: Secondary | ICD-10-CM | POA: Insufficient documentation

## 2021-12-13 DIAGNOSIS — Z7982 Long term (current) use of aspirin: Secondary | ICD-10-CM | POA: Diagnosis not present

## 2021-12-13 DIAGNOSIS — L02213 Cutaneous abscess of chest wall: Secondary | ICD-10-CM | POA: Insufficient documentation

## 2021-12-13 DIAGNOSIS — L0291 Cutaneous abscess, unspecified: Secondary | ICD-10-CM

## 2021-12-13 MED ORDER — LIDOCAINE-EPINEPHRINE (PF) 2 %-1:200000 IJ SOLN
10.0000 mL | Freq: Once | INTRAMUSCULAR | Status: AC
  Administered 2021-12-13: 10 mL via INTRADERMAL
  Filled 2021-12-13: qty 20

## 2021-12-13 MED ORDER — SULFAMETHOXAZOLE-TRIMETHOPRIM 800-160 MG PO TABS: 1.0000 | ORAL_TABLET | Freq: Two times a day (BID) | ORAL | 0 refills | Status: DC

## 2021-12-13 MED ORDER — CEPHALEXIN 500 MG PO CAPS: 500.0000 mg | ORAL_CAPSULE | Freq: Four times a day (QID) | ORAL | 0 refills | Status: DC

## 2021-12-13 NOTE — ED Provider Notes (Signed)
[facility] EMERGENCY DEPARTMENT Provider Note   CSN: [id number] Arrival date & time: 12/13/21  1048     History  Chief Complaint  Patient presents with   Abscess    Ronald Marsh is a 61 y.o. male.  61 yo M with a chief complaint of right upper back pain and swelling.  This been going on for about a week now.  No fevers or chills.  No injury to the skin prior.   Abscess      Home Medications Prior to Admission medications   Medication Sig Start Date End Date Taking? Authorizing Provider  cephALEXin (KEFLEX) 500 MG capsule Take 1 capsule (500 mg total) by mouth 4 (four) times daily. 12/13/21  Yes Deno Etienne, DO  sulfamethoxazole-trimethoprim (BACTRIM DS) 800-160 MG tablet Take 1 tablet by mouth 2 (two) times daily for 7 days. 12/13/21 12/20/21 Yes Deno Etienne, DO  Accu-Chek FastClix Lancets MISC Use as instructed. Inject into the skin once daily. E11.9 01/27/20   [name], NP  acetaminophen (TYLENOL) 500 MG tablet Take 1 tablet (500 mg total) by mouth every 6 (six) hours as needed for moderate pain. 09/09/19   Corena Herter, PA-C  amLODipine (NORVASC) 10 MG tablet Take 1 tablet (10 mg total) by mouth daily. 09/20/21   [name], NP  aspirin 81 MG tablet Take 1 tablet (81 mg total) by mouth daily. 06/12/18   Elsie Stain, MD  atorvastatin (LIPITOR) 80 MG tablet TAKE 1 TABLET(80 MG) BY MOUTH DAILY 10/13/21   [name], NP  BIDIL 20-37.5 MG tablet TAKE 2 TABLETS BY MOUTH IN THE MORNING AND AT BEDTIME 10/22/21   Bensimhon, Shaune Pascal, MD  Blood Glucose Calibration (ACCU-CHEK GUIDE CONTROL) LIQD 1 each by In Vitro route once as needed for up to 1 dose. E11.9 10/05/18   [name], NP  Blood Pressure Monitor DEVI Please provide patient with insurance approved blood pressure monitor 10/14/18   [name], NP  carvedilol (COREG) 12.5 MG tablet Take 1 tablet (12.5 mg total) by mouth 2 (two) times daily with a meal. 09/20/21 03/19/22  [name], NP  clopidogrel (PLAVIX) 75 MG tablet Take 1 tablet (75 mg total) by mouth daily. 09/20/21 03/19/22  [name], NP  dapagliflozin propanediol (FARXIGA) 10 MG TABS tablet Take 1 tablet (10 mg total) by mouth daily before breakfast. 09/20/21   [name], NP  gabapentin (NEURONTIN) 600 MG tablet Take 0.5-1 tablets (300-600 mg total) by mouth at bedtime. 07/13/21 10/11/21  [name], NP  glucose blood (ACCU-CHEK GUIDE) test strip Use as instructed. Check blood glucose by fingerstick once per day.  E11.9 03/12/19   [name], NP  metFORMIN (GLUCOPHAGE) 500 MG tablet Take 1 tablet (500 mg total) by mouth 2 (two) times daily with a meal. TAKE 2 TABLETS BY MOUTH EVERY MORNING WITH BREAKFAST THEN 1 TABLET BY MOUTH WITH DINNER 09/20/21   [name], NP  neomycin-polymyxin-hydrocortisone (CORTISPORIN) OTIC solution Apply 2-3 drops to the ingrown toenail site twice daily. Cover with band-aid. 04/22/19   [name], DPM  polyethylene glycol powder (GLYCOLAX/MIRALAX) 17 GM/SCOOP powder Take 17 grams once a day 05/31/21   Doran Stabler, MD  spironolactone (ALDACTONE) 25 MG tablet TAKE 1 TABLET(25 MG) BY MOUTH DAILY 08/11/21   Charlott Rakes, MD      Allergies    Benicar [olmesartan], Ace inhibitors, and Angiotensin receptor blockers  Review of Systems   Review of Systems  Physical Exam Updated Vital Signs BP 109/71 (BP Location: Right Arm)   Pulse 64   Temp 98.1 F (36.7 C) (Oral)   Resp 16   SpO2 99%  Physical Exam Vitals and nursing note reviewed.  Constitutional:      Appearance: He is well-developed.  HENT:     Head: Normocephalic and atraumatic.  Eyes:     Pupils: Pupils are equal, round, and reactive to light.  Neck:     Vascular: No JVD.  Cardiovascular:     Rate and Rhythm: Normal rate and regular rhythm.     Heart sounds: No murmur heard.    No friction rub. No gallop.  Pulmonary:     Effort: No respiratory distress.     Breath sounds: No  wheezing.  Abdominal:     General: There is no distension.     Tenderness: There is no abdominal tenderness. There is no guarding or rebound.  Musculoskeletal:        General: Tenderness present. Normal range of motion.     Cervical back: Normal range of motion and neck supple.     Comments: Redness and fluctuance along the right upper back  Skin:    Coloration: Skin is not pale.     Findings: No rash.  Neurological:     Mental Status: He is alert and oriented to person, place, and time.  Psychiatric:        Behavior: Behavior normal.     ED Results / Procedures / Treatments   Labs (all labs ordered are listed, but only abnormal results are displayed) Labs Reviewed - No data to display  EKG None  Radiology No results found.  Procedures .Marland KitchenIncision and Drainage  Date/Time: 12/13/2021 3:25 PM  Performed by: Deno Etienne, DO Authorized by: Deno Etienne, DO   Consent:    Consent obtained:  Verbal   Consent given by:  Patient   Risks, benefits, and alternatives were discussed: yes     Risks discussed:  Bleeding, incomplete drainage and damage to other organs   Alternatives discussed:  No treatment Universal protocol:    Procedure explained and questions answered to patient or proxy's satisfaction: yes   Location:    Type:  Abscess   Size:  Quarter   Location:  Trunk   Trunk location:  Chest Pre-procedure details:    Skin preparation:  Chlorhexidine Sedation:    Sedation type:  None Anesthesia:    Anesthesia method:  Local infiltration   Local anesthetic:  Lidocaine 2% WITH epi Procedure type:    Complexity:  Complex Procedure details:    Ultrasound guidance: no     Needle aspiration: no     Incision types:  Stab incision   Wound management:  Probed and deloculated   Drainage:  Bloody   Drainage amount:  Copious Post-procedure details:    Procedure completion:  Tolerated well, no immediate complications     Medications Ordered in ED Medications   lidocaine-EPINEPHrine (XYLOCAINE W/EPI) 2 %-1:200000 (PF) injection 10 mL (10 mLs Intradermal Given 12/13/21 1423)    ED Course/ Medical Decision Making/ A&P                           Medical Decision Making Risk Prescription drug management.   61 yo M with a cc of an abscess.  Incision and drainage bedside.  Discharge home  3:26 PM:  I have  discussed the diagnosis/risks/treatment options with the patient.  Evaluation and diagnostic testing in the emergency department does not suggest an emergent condition requiring admission or immediate intervention beyond what has been performed at this time.  They will follow up with  PCP. We also discussed returning to the ED immediately if new or worsening sx occur. We discussed the sx which are most concerning (e.g., sudden worsening pain, fever, inability to tolerate by mouth) that necessitate immediate return. Medications administered to the patient during their visit and any new prescriptions provided to the patient are listed below.  Medications given during this visit Medications  lidocaine-EPINEPHrine (XYLOCAINE W/EPI) 2 %-1:200000 (PF) injection 10 mL (10 mLs Intradermal Given 12/13/21 1423)     The patient appears reasonably screen and/or stabilized for discharge and I doubt any other medical condition or other [facility] requiring further screening, evaluation, or treatment in the ED at this time prior to discharge.          Final Clinical Impression(s) / ED Diagnoses Final diagnoses:  Abscess    Rx / DC Orders ED Discharge Orders          Ordered    cephALEXin (KEFLEX) 500 MG capsule  4 times daily        12/13/21 1505    sulfamethoxazole-trimethoprim (BACTRIM DS) 800-160 MG tablet  2 times daily        12/13/21 [place], Candee Hoon, DO 12/13/21 1526

## 2021-12-13 NOTE — Discharge Instructions (Signed)
Warm compresses 4x a day return for rapid spreading redness or if you develop a fever.

## 2021-12-13 NOTE — ED Provider Triage Note (Signed)
Emergency Medicine Provider Triage Evaluation Note  Ronald Marsh , a 61 y.o. male  was evaluated in triage.  Pt complains of abscess.  Patient states that for 1 week he has had a growing lump on his right upper back.  He states that this is, before and then drained.  He denies any fevers, nausea..  Review of Systems  Positive:  Negative:   Physical Exam  BP 120/76 (BP Location: Right Arm)   Pulse 72   Temp 98.5 F (36.9 C) (Oral)   Resp 16   SpO2 99%  Gen:   Awake, no distress   Resp:  Normal effort  MSK:   Moves extremities without difficulty  Other:  Around 4 inch area of swelling and induration to the right upper back.  No surrounding cellulitis.  Medical Decision Making  Medically screening exam initiated at 11:02 AM.  Appropriate orders placed.  Grigor Lipschutz was informed that the remainder of the evaluation will be completed by another provider, this initial triage assessment does not replace that evaluation, and the importance of remaining in the ED until their evaluation is complete.     Mickie Hillier, PA-C 12/13/21 1103

## 2021-12-13 NOTE — ED Triage Notes (Signed)
Patient complains of abscess on the right of his upper back that he noticed two weeks ago. Denies drainage.

## 2021-12-16 ENCOUNTER — Emergency Department (HOSPITAL_COMMUNITY)
Admission: EM | Admit: 2021-12-16 | Discharge: 2021-12-16 | Disposition: A | Discharge status: Home / Self Care | Payer: Medicaid Other | Attending: Emergency Medicine | Admitting: Emergency Medicine

## 2021-12-16 ENCOUNTER — Encounter (HOSPITAL_COMMUNITY): Payer: Self-pay | Admitting: Emergency Medicine

## 2021-12-16 ENCOUNTER — Other Ambulatory Visit: Payer: Self-pay

## 2021-12-16 DIAGNOSIS — R220 Localized swelling, mass and lump, head: Secondary | ICD-10-CM | POA: Diagnosis present

## 2021-12-16 DIAGNOSIS — Z7902 Long term (current) use of antithrombotics/antiplatelets: Secondary | ICD-10-CM | POA: Insufficient documentation

## 2021-12-16 DIAGNOSIS — Z79899 Other long term (current) drug therapy: Secondary | ICD-10-CM | POA: Diagnosis not present

## 2021-12-16 DIAGNOSIS — Z7982 Long term (current) use of aspirin: Secondary | ICD-10-CM | POA: Diagnosis not present

## 2021-12-16 DIAGNOSIS — I509 Heart failure, unspecified: Secondary | ICD-10-CM | POA: Insufficient documentation

## 2021-12-16 DIAGNOSIS — I110 Hypertensive heart disease with heart failure: Secondary | ICD-10-CM | POA: Insufficient documentation

## 2021-12-16 DIAGNOSIS — Z7984 Long term (current) use of oral hypoglycemic drugs: Secondary | ICD-10-CM | POA: Insufficient documentation

## 2021-12-16 DIAGNOSIS — T783XXA Angioneurotic edema, initial encounter: Secondary | ICD-10-CM | POA: Diagnosis not present

## 2021-12-16 DIAGNOSIS — E119 Type 2 diabetes mellitus without complications: Secondary | ICD-10-CM | POA: Diagnosis not present

## 2021-12-16 DIAGNOSIS — I5033 Acute on chronic diastolic (congestive) heart failure: Secondary | ICD-10-CM

## 2021-12-16 MED ORDER — DIPHENHYDRAMINE HCL 25 MG PO CAPS
50.0000 mg | ORAL_CAPSULE | Freq: Once | ORAL | Status: AC
  Administered 2021-12-16: 50 mg via ORAL
  Filled 2021-12-16: qty 2

## 2021-12-16 MED ORDER — FAMOTIDINE IN NACL 20-0.9 MG/50ML-% IV SOLN
20.0000 mg | Freq: Once | INTRAVENOUS | Status: DC
  Filled 2021-12-16: qty 50

## 2021-12-16 MED ORDER — DOXYCYCLINE HYCLATE 100 MG PO CAPS: 100.0000 mg | ORAL_CAPSULE | Freq: Two times a day (BID) | ORAL | 0 refills | Status: AC

## 2021-12-16 MED ORDER — PREDNISONE 20 MG PO TABS
60.0000 mg | ORAL_TABLET | Freq: Once | ORAL | Status: AC
  Administered 2021-12-16: 60 mg via ORAL
  Filled 2021-12-16: qty 3

## 2021-12-16 MED ORDER — EPINEPHRINE 0.3 MG/0.3ML IJ SOAJ
0.3000 mg | Freq: Once | INTRAMUSCULAR | Status: AC
  Administered 2021-12-16: 0.3 mg via INTRAMUSCULAR
  Filled 2021-12-16: qty 0.3

## 2021-12-16 MED ORDER — EPINEPHRINE 0.3 MG/0.3ML IJ SOAJ: 0.3000 mg | INTRAMUSCULAR | 0 refills | Status: DC | PRN

## 2021-12-16 MED ORDER — PREDNISONE 20 MG PO TABS: ORAL_TABLET | ORAL | 0 refills | Status: DC

## 2021-12-16 NOTE — ED Provider Notes (Signed)
[facility] EMERGENCY DEPARTMENT Provider Note   CSN: [id number] Arrival date & time: 12/16/21  1884     History  Chief Complaint  Patient presents with   Allergic Reaction    Ronald Marsh is a 61 y.o. male.  HPI 61 year old male presents with tongue swelling.  He woke up with this at 7 AM.  At the time I am seeing him, around 940, he states that it is a little bit improved.  He was just started on Keflex and Bactrim for abscess that was drained a few days ago.  He has not taken it this morning but is concerned about allergic reaction.  No dental pain or neck pain.  No fevers, trouble breathing, trouble swallowing.  His voice seems a little different from the tongue size.  No rash or itching.  He has a prior history of a "allergic reaction" that he states was similar and it seems like on chart review this was based on ACE/ARB inhibitor angioedema but is unclear.  She has multiple comorbidities which include heart failure, hepatitis C, diabetes, stroke, hypertension.  Home Medications Prior to Admission medications   Medication Sig Start Date End Date Taking? Authorizing Provider  doxycycline (VIBRAMYCIN) 100 MG capsule Take 1 capsule (100 mg total) by mouth 2 (two) times daily for 3 days. 12/16/21 12/19/21 Yes Sherwood Gambler, MD  EPINEPHrine 0.3 mg/0.3 mL IJ SOAJ injection Inject 0.3 mg into the muscle as needed for anaphylaxis. 12/16/21  Yes Sherwood Gambler, MD  predniSONE (DELTASONE) 20 MG tablet 2 tabs po daily x 4 days 12/16/21  Yes Sherwood Gambler, MD  Accu-Chek FastClix Lancets MISC Use as instructed. Inject into the skin once daily. E11.9 01/27/20   [name], NP  acetaminophen (TYLENOL) 500 MG tablet Take 1 tablet (500 mg total) by mouth every 6 (six) hours as needed for moderate pain. 09/09/19   Corena Herter, PA-C  amLODipine (NORVASC) 10 MG tablet Take 1 tablet (10 mg total) by mouth daily. 09/20/21   [name], NP  aspirin 81 MG tablet Take 1  tablet (81 mg total) by mouth daily. 06/12/18   Elsie Stain, MD  atorvastatin (LIPITOR) 80 MG tablet TAKE 1 TABLET(80 MG) BY MOUTH DAILY 10/13/21   [name], NP  BIDIL 20-37.5 MG tablet TAKE 2 TABLETS BY MOUTH IN THE MORNING AND AT BEDTIME 10/22/21   Bensimhon, Shaune Pascal, MD  Blood Glucose Calibration (ACCU-CHEK GUIDE CONTROL) LIQD 1 each by In Vitro route once as needed for up to 1 dose. E11.9 10/05/18   [name], NP  Blood Pressure Monitor DEVI Please provide patient with insurance approved blood pressure monitor 10/14/18   [name], NP  carvedilol (COREG) 12.5 MG tablet Take 1 tablet (12.5 mg total) by mouth 2 (two) times daily with a meal. 09/20/21 03/19/22  [name], NP  clopidogrel (PLAVIX) 75 MG tablet Take 1 tablet (75 mg total) by mouth daily. 09/20/21 03/19/22  [name], NP  dapagliflozin propanediol (FARXIGA) 10 MG TABS tablet Take 1 tablet (10 mg total) by mouth daily before breakfast. 09/20/21   [name], NP  gabapentin (NEURONTIN) 600 MG tablet Take 0.5-1 tablets (300-600 mg total) by mouth at bedtime. 07/13/21 10/11/21  [name], NP  glucose blood (ACCU-CHEK GUIDE) test strip Use as instructed. Check blood glucose by fingerstick once per day.  E11.9 03/12/19   [name], NP  metFORMIN (GLUCOPHAGE) 500 MG tablet Take  1 tablet (500 mg total) by mouth 2 (two) times daily with a meal. TAKE 2 TABLETS BY MOUTH EVERY MORNING WITH BREAKFAST THEN 1 TABLET BY MOUTH WITH DINNER 09/20/21   [name], NP  neomycin-polymyxin-hydrocortisone (CORTISPORIN) OTIC solution Apply 2-3 drops to the ingrown toenail site twice daily. Cover with band-aid. 04/22/19   [name], DPM  polyethylene glycol powder (GLYCOLAX/MIRALAX) 17 GM/SCOOP powder Take 17 grams once a day 05/31/21   Doran Stabler, MD  spironolactone (ALDACTONE) 25 MG tablet TAKE 1 TABLET(25 MG) BY MOUTH DAILY 08/11/21   Charlott Rakes, MD      Allergies    Benicar [olmesartan],  Ace inhibitors, and Angiotensin receptor blockers    Review of Systems   Review of Systems  Constitutional:  Negative for fever.  HENT:  Positive for facial swelling and voice change. Negative for dental problem and trouble swallowing.   Respiratory:  Negative for cough and shortness of breath.   Cardiovascular:  Negative for chest pain.  Musculoskeletal:  Negative for neck pain.    Physical Exam Updated Vital Signs BP 126/79   Pulse 63   Temp 98 F (36.7 C)   Resp 20   SpO2 97%  Physical Exam Vitals and nursing note reviewed.  Constitutional:      Appearance: He is well-developed.  HENT:     Head: Normocephalic and atraumatic.     Mouth/Throat:     Comments: Base of the tongue is swollen bilaterally, mild to moderate.  Difficult to fully see posterior oropharynx due to the tongue size.  He is otherwise speaking without difficulty though a slightly muffled voice.  There is no floor of the mouth swelling/Ludwig angina.  He has some abnormal dentition but no obvious acute infection.  No neck swelling. Cardiovascular:     Rate and Rhythm: Normal rate and regular rhythm.     Heart sounds: Normal heart sounds.  Pulmonary:     Effort: Pulmonary effort is normal.     Breath sounds: Normal breath sounds. No stridor. No wheezing.  Abdominal:     Palpations: Abdomen is soft.     Tenderness: There is no abdominal tenderness.  Skin:    General: Skin is warm and dry.     Findings: No rash.  Neurological:     Mental Status: He is alert.     ED Results / Procedures / Treatments   Labs (all labs ordered are listed, but only abnormal results are displayed) Labs Reviewed - No data to display  EKG None  Radiology No results found.  Procedures Procedures    Medications Ordered in ED Medications  EPINEPHrine (EPI-PEN) injection 0.3 mg (0.3 mg Intramuscular Given 12/16/21 1014)  predniSONE (DELTASONE) tablet 60 mg (60 mg Oral Given 12/16/21 1011)  diphenhydrAMINE (BENADRYL)  capsule 50 mg (50 mg Oral Given 12/16/21 1011)    ED Course/ Medical Decision Making/ A&P                           Medical Decision Making Risk Prescription drug management.   Patient is well-appearing on arrival though does have some tongue swelling.  I doubt this is infectious.  Probably not allergic but with the recent prescription for antibiotics, he was treated as such with epinephrine, prednisone, Benadryl.  He states it has perhaps may be improved but not as dramatically as you would expect with an allergic reaction.  Still out of an abundance of caution  will switch up his antibiotics for the next few days.  Otherwise, based on chart review it seems like he probably had angioedema several years in the past.  This is probably a recurrent episode though he is not on ACE or ARB.  Either way, he feels like he is slightly improved since onset which is now over 6 hours.  He feels comfortable going home and is tolerating fluids and crackers here.  Will give a short course of prednisone and an EpiPen but otherwise I think he is stable for discharge home to follow-up with PCP and an allergist.        Final Clinical Impression(s) / ED Diagnoses Final diagnoses:  Angioedema, initial encounter    Rx / DC Orders ED Discharge Orders          Ordered    predniSONE (DELTASONE) 20 MG tablet        12/16/21 1332    EPINEPHrine 0.3 mg/0.3 mL IJ SOAJ injection  As needed        12/16/21 1332    doxycycline (VIBRAMYCIN) 100 MG capsule  2 times daily        12/16/21 1332              Sherwood Gambler, MD 12/16/21 1459

## 2021-12-16 NOTE — ED Triage Notes (Signed)
Pt with tongue swelling causing muffled speech at time of triage.  Pt reports does not feel his throat itching or closing up.  No trouble breathing right now.  Pt just started anbx after having an abscess I &D 3 days ago.

## 2021-12-16 NOTE — Discharge Instructions (Addendum)
You are being referred to an allergist.  It is unclear if your tongue swelling was allergic in nature or something called angioedema.  Follow-up closely with your primary care physician.  It is possible the antibiotics contributed and so we will have you stop those 2 antibiotics (cephalexin and sulfamethoxazole).  You are being given a new antibiotic for the next few days.  You can continue to take Benadryl and you will be started on steroids in case this is allergic, starting the prednisone tomorrow.  Be careful and watch her sugars as this will make her sugars go up a little.  If at any point your tongue swelling worsens, you develop trouble breathing, trouble swallowing, dental or neck pain, facial swelling, or any other new/concerning symptoms then return to the ER or call 911 for evaluation.

## 2021-12-17 ENCOUNTER — Telehealth: Payer: Self-pay

## 2021-12-17 NOTE — Telephone Encounter (Signed)
Transition Care Management Unsuccessful Follow-up Telephone Call  Date of discharge and from where:  12/16/2021 from [facility]  Attempts:  1st Attempt  Reason for unsuccessful TCM follow-up call:  Left voice message

## 2021-12-20 NOTE — Telephone Encounter (Signed)
Transition Care Management Unsuccessful Follow-up Telephone Call  Date of discharge and from where:  12/16/2021 from [facility]  Attempts:  2nd Attempt  Reason for unsuccessful TCM follow-up call:  Left voice message

## 2021-12-21 NOTE — Telephone Encounter (Signed)
Transition Care Management Unsuccessful Follow-up Telephone Call  Date of discharge and from where:  12/16/2021 from [facility]  Attempts:  3rd Attempt  Reason for unsuccessful TCM follow-up call:  Unable to reach patient

## 2021-12-22 ENCOUNTER — Ambulatory Visit: Payer: Medicaid Other | Admitting: Nurse Practitioner

## 2021-12-28 ENCOUNTER — Encounter (HOSPITAL_COMMUNITY): Payer: Self-pay | Admitting: Internal Medicine

## 2021-12-28 ENCOUNTER — Ambulatory Visit (HOSPITAL_COMMUNITY)
Admission: RE | Admit: 2021-12-28 | Discharge: 2021-12-28 | Disposition: A | Discharge status: Home / Self Care | Payer: Medicaid Other | Source: Ambulatory Visit | Attending: Internal Medicine | Admitting: Internal Medicine

## 2021-12-28 VITALS — BP 110/78 | HR 63 | Wt 198.4 lb

## 2021-12-28 DIAGNOSIS — I428 Other cardiomyopathies: Secondary | ICD-10-CM | POA: Diagnosis not present

## 2021-12-28 DIAGNOSIS — I10 Essential (primary) hypertension: Secondary | ICD-10-CM

## 2021-12-28 DIAGNOSIS — I110 Hypertensive heart disease with heart failure: Secondary | ICD-10-CM | POA: Diagnosis not present

## 2021-12-28 DIAGNOSIS — Z79899 Other long term (current) drug therapy: Secondary | ICD-10-CM | POA: Diagnosis not present

## 2021-12-28 DIAGNOSIS — Z8673 Personal history of transient ischemic attack (TIA), and cerebral infarction without residual deficits: Secondary | ICD-10-CM | POA: Diagnosis not present

## 2021-12-28 DIAGNOSIS — Z720 Tobacco use: Secondary | ICD-10-CM

## 2021-12-28 DIAGNOSIS — E119 Type 2 diabetes mellitus without complications: Secondary | ICD-10-CM | POA: Insufficient documentation

## 2021-12-28 DIAGNOSIS — I5032 Chronic diastolic (congestive) heart failure: Secondary | ICD-10-CM | POA: Diagnosis not present

## 2021-12-28 DIAGNOSIS — E785 Hyperlipidemia, unspecified: Secondary | ICD-10-CM | POA: Insufficient documentation

## 2021-12-28 DIAGNOSIS — Z7984 Long term (current) use of oral hypoglycemic drugs: Secondary | ICD-10-CM | POA: Insufficient documentation

## 2021-12-28 DIAGNOSIS — I5022 Chronic systolic (congestive) heart failure: Secondary | ICD-10-CM | POA: Diagnosis not present

## 2021-12-28 DIAGNOSIS — Z87891 Personal history of nicotine dependence: Secondary | ICD-10-CM | POA: Diagnosis not present

## 2021-12-28 LAB — BRAIN NATRIURETIC PEPTIDE: B Natriuretic Peptide: 15.5 pg/mL (ref 0.0–100.0)

## 2021-12-28 LAB — BASIC METABOLIC PANEL
Anion gap: 7 (ref 5–15)
BUN: 11 mg/dL (ref 6–20)
CO2: 23 mmol/L (ref 22–32)
Calcium: 9 mg/dL (ref 8.9–10.3)
Chloride: 110 mmol/L (ref 98–111)
Creatinine, Ser: 0.87 mg/dL (ref 0.61–1.24)
GFR, Estimated: >60 mL/min (ref >60)
Glucose, Bld: 120 mg/dL — ABNORMAL HIGH (ref 70–99)
Potassium: 3.6 mmol/L (ref 3.5–5.1)
Sodium: 140 mmol/L (ref 135–145)

## 2021-12-28 NOTE — Progress Notes (Addendum)
ADVANCED HF CLINIC NOTE  Referring Physician: Dr. Joya Gaskins HF Cardiologist: DB  HPI:  Ronald Marsh is a 61 y/o male with multiple medical problems including substance abuse (tobacco, cocaine), HCV (s/p treatment), HTN, HL, DM2, previous TIA and  h/o systolic HF dating back to 2011. Echo 02/2010 with EF 50-55%  Admitted to [facility] 9/19 with acute HF. EF 25-30%. Had cath. No CAD.   Admitted to [facility] 12/19 with recurrent HF and HTN. Echo done 07/03/18 EF 25-30%   Echo 12/20 EF 40-45% RV ok Personally reviewed  Had angioedema with Benicar so not on ACE/ARNI/ARB  Last ECHO 01/20/2020 EF 45-50%.  RV okay  Feeling good today.  Still very active and walking a lot. No longer smoking cigarettes, only occasional THC use.  No alcohol use.  Checks BP at home intermittently, states it's "never too high".  Denies chest pain, dyspnea or edema.    Past Medical History:  Diagnosis Date   Diabetes mellitus without complication ([place])    Heart failure ([place])    Hepatitis C    History of CVA (cerebrovascular accident) 02/2010   Left periventricular subcortical ischemic infarction // Residual RUE and RLE weakness   History of depression    surrounding original stroke - has since resolved (04/2012)   Hyperlipidemia    Hypertension    Internal carotid artery stenosis    BL and mild per CT angiogram (05/2010)   Stroke ([place]) 2012   Tobacco abuse     Current Outpatient Medications  Medication Sig Dispense Refill   Accu-Chek FastClix Lancets MISC Use as instructed. Inject into the skin once daily. E11.9 100 each 3   acetaminophen (TYLENOL) 500 MG tablet Take 1 tablet (500 mg total) by mouth every 6 (six) hours as needed for moderate pain. 30 tablet 0   amLODipine (NORVASC) 10 MG tablet Take 1 tablet (10 mg total) by mouth daily. 90 tablet 1   aspirin 81 MG tablet Take 1 tablet (81 mg total) by mouth daily. 30 tablet 5   atorvastatin (LIPITOR) 80 MG tablet TAKE 1 TABLET(80 MG) BY MOUTH DAILY 90 tablet 0    BIDIL 20-37.5 MG tablet TAKE 2 TABLETS BY MOUTH IN THE MORNING AND AT BEDTIME 120 tablet 1   Blood Glucose Calibration (ACCU-CHEK GUIDE CONTROL) LIQD 1 each by In Vitro route once as needed for up to 1 dose. E11.9 1 each 0   Blood Pressure Monitor DEVI Please provide patient with insurance approved blood pressure monitor 1 Device 0   carvedilol (COREG) 12.5 MG tablet Take 1 tablet (12.5 mg total) by mouth 2 (two) times daily with a meal. 180 tablet 1   clopidogrel (PLAVIX) 75 MG tablet Take 1 tablet (75 mg total) by mouth daily. 90 tablet 1   dapagliflozin propanediol (FARXIGA) 10 MG TABS tablet Take 1 tablet (10 mg total) by mouth daily before breakfast. 90 tablet 1   EPINEPHrine 0.3 mg/0.3 mL IJ SOAJ injection Inject 0.3 mg into the muscle as needed for anaphylaxis. 1 each 0   gabapentin (NEURONTIN) 600 MG tablet Take 0.5-1 tablets (300-600 mg total) by mouth at bedtime. 90 tablet 0   glucose blood (ACCU-CHEK GUIDE) test strip Use as instructed. Check blood glucose by fingerstick once per day.  E11.9 100 each 12   metFORMIN (GLUCOPHAGE) 500 MG tablet Take 1 tablet (500 mg total) by mouth 2 (two) times daily with a meal. TAKE 2 TABLETS BY MOUTH EVERY MORNING WITH BREAKFAST THEN 1 TABLET BY MOUTH WITH DINNER  270 tablet 1   spironolactone (ALDACTONE) 25 MG tablet TAKE 1 TABLET(25 MG) BY MOUTH DAILY 90 tablet 1   No current facility-administered medications for this encounter.    Allergies  Allergen Reactions   Benicar [Olmesartan] Anaphylaxis and Swelling    Patient cannot name the site of swelling (??)   Ace Inhibitors Swelling    Patient cannot name the site of swelling (??)   Angiotensin Receptor Blockers Swelling    Patient cannot name the site of swelling (??)      Social History   Socioeconomic History   Marital status: Single    Spouse name: Not on file   Number of children: 1   Years of education: 11th grade   Highest education level: Not on file  Occupational History    Occupation: On disability    Comment: previously worked for [employer] until his stroke in 2011.   Tobacco Use   Smoking status: Former    Packs/day: 0.30    Years: 31.00    Total pack years: 9.30    Types: Cigarettes    Start date: 04/25/1981    Quit date: 05/27/2021    Years since quitting: 0.5   Smokeless tobacco: Never  Vaping Use   Vaping Use: Never used  Substance and Sexual Activity   Alcohol use: Not Currently   Drug use: No    Comment: Former smoke crack    Sexual activity: Yes  Other Topics Concern   Not on file  Social History Narrative   Lives in [place] with wife.   Has been incarcerated multiple times, last time was in 2012 for 8 months.   Social Determinants of Health   Financial Resource Strain: Not on file  Food Insecurity: No Food Insecurity (01/22/2021)   Hunger Vital Sign    Worried About Running Out of Food in the Last Year: Never true    Ran Out of Food in the Last Year: Never true  Transportation Needs: Not on file  Physical Activity: Not on file  Stress: Not on file  Social Connections: Not on file  Intimate Partner Violence: Not on file      Family History  Problem Relation Age of Onset   Diabetes Mother        on dialysis   Heart failure Mother    Hypertension Mother    Hyperlipidemia Mother    Glaucoma Mother    Lung cancer Father    Diabetes Sister    Colon polyps Sister    Arthritis Sister    Hypertension Brother    Diabetes Brother    Pancreatic cancer Brother    Colon cancer Neg Hx    Esophageal cancer Neg Hx     Vitals:   12/28/21 1025  BP: 110/78  Pulse: 63  SpO2: 98%  Weight: 90 kg (198 lb 6.4 oz)     PHYSICAL EXAM: General:  Well appearing. No resp difficulty HEENT: normal Neck: supple. no JVD. Carotids 2+ bilat; no bruits. No lymphadenopathy or thryomegaly appreciated. Cor: PMI nondisplaced. Regular rate & rhythm. No rubs, gallops or murmurs. Lungs: clear but decreased throughout Abdomen: soft,  nontender, nondistended. No hepatosplenomegaly. No bruits or masses. Good bowel sounds. Extremities: no cyanosis, clubbing, rash, edema Neuro: alert & orientedx3, cranial nerves grossly intact. moves all 4 extremities w/o difficulty. Affect pleasant   ASSESSMENT & PLAN:  1. Chronic systolic HF due to NICM - Echo 9/19 EF 25-30%, Echo 2/20 EF 25-30 - Cath 9/19 [facility]. No  CAD - Echo 12/20 EF improved to 40-45% - Last ECHO 01/20/2020 EF 45-50%.  RV okay - Suspect due to HTN and substance use - Doing well. NYHA I - Volume status looks good.  - Continue carvedilol 12.5 bid - Continue spiro 25 daily. - No ACE/ARB/ARNI with angioedema to Benicar in past - Continue Bidil 2 tabs tid - Continue Farxiga '10mg'$  daily - Denies further use of ETOH. Intermittent THC use - BMET today - Suspect EF may have recovered. Repeat echo  2. HTN - Blood pressure well controlled. Continue current regimen.  3. Substance abuse - Denies ETOH/cocaine. Occasional THC use.   4. CVA - Continue DAPT and statin  - Minimal residual deficit   5. HCV - treated. last VL undetectable   6. Tobacco use - remains quit  7. ED - wants to use PDE5i but unable to with Bidil use - will await echo. If EF improved can consider switching Bidil to hydralazine alone if he really wants to consider PDE5i use - though he realizes this would compromise the heart benefit of Bidil.     Glori Bickers, MD  11:08 AM

## 2021-12-28 NOTE — Patient Instructions (Signed)
Medication Changes:  No changes, continue current regimen.  Lab Work:  Labs done today, your results will be available in MyChart, we will contact you for abnormal readings.  Testing/Procedures:  Your physician has requested that you have an echocardiogram. Echocardiography is a painless test that uses sound waves to create images of your heart. It provides your doctor with information about the size and shape of your heart and how well your heart's chambers and valves are working. This procedure takes approximately one hour. There are no restrictions for this procedure.  Referrals:  none  Special Instructions // Education:  Do the following things EVERYDAY: Weigh yourself in the morning before breakfast. Write it down and keep it in a log. Take your medicines as prescribed Eat low salt foods--Limit salt (sodium) to 2000 mg per day.  Stay as active as you can everyday Limit all fluids for the day to less than 2 liters  Follow-Up in: 3 months   At the [facility], you and your health needs are our priority. We have a designated team specialized in the treatment of Heart Failure. This Care Team includes your primary Heart Failure Specialized Cardiologist (physician), Advanced Practice Providers (APPs- Physician Assistants and Nurse Practitioners), and Pharmacist who all work together to provide you with the care you need, when you need it.   You may see any of the following providers on your designated Care Team at your next follow up:  Dr Glori Bickers Dr Haynes Kerns, NP Lyda Jester, [place] [facility] [place], [place] Audry Riles, PharmD   Please be sure to bring in all your medications bottles to every appointment.   Need to Contact us:  If you have any questions or concerns before your next appointment please send [place] a message through [place] or call our office at [phone].    TO LEAVE A MESSAGE FOR THE NURSE SELECT OPTION  2, PLEASE LEAVE A MESSAGE INCLUDING: YOUR NAME DATE OF BIRTH CALL BACK NUMBER REASON FOR CALL**this is important as we prioritize the call backs  YOU WILL RECEIVE A CALL BACK THE SAME DAY AS LONG AS YOU CALL BEFORE 4:00 PM

## 2022-01-07 ENCOUNTER — Ambulatory Visit (HOSPITAL_COMMUNITY)
Admission: RE | Admit: 2022-01-07 | Discharge: 2022-01-07 | Disposition: A | Discharge status: Home / Self Care | Payer: Medicaid Other | Source: Ambulatory Visit | Attending: Internal Medicine | Admitting: Internal Medicine

## 2022-01-07 DIAGNOSIS — I4891 Unspecified atrial fibrillation: Secondary | ICD-10-CM | POA: Insufficient documentation

## 2022-01-07 DIAGNOSIS — N189 Chronic kidney disease, unspecified: Secondary | ICD-10-CM | POA: Insufficient documentation

## 2022-01-07 DIAGNOSIS — I2510 Atherosclerotic heart disease of native coronary artery without angina pectoris: Secondary | ICD-10-CM | POA: Diagnosis not present

## 2022-01-07 DIAGNOSIS — I509 Heart failure, unspecified: Secondary | ICD-10-CM | POA: Insufficient documentation

## 2022-01-07 DIAGNOSIS — Z951 Presence of aortocoronary bypass graft: Secondary | ICD-10-CM | POA: Diagnosis not present

## 2022-01-07 DIAGNOSIS — I429 Cardiomyopathy, unspecified: Secondary | ICD-10-CM | POA: Diagnosis not present

## 2022-01-07 DIAGNOSIS — I5032 Chronic diastolic (congestive) heart failure: Secondary | ICD-10-CM | POA: Diagnosis not present

## 2022-01-07 DIAGNOSIS — I358 Other nonrheumatic aortic valve disorders: Secondary | ICD-10-CM | POA: Diagnosis not present

## 2022-01-07 LAB — ECHOCARDIOGRAM COMPLETE
Area-P 1/2: 2.94 cm2
S' Lateral: 4 cm

## 2022-01-07 NOTE — Progress Notes (Signed)
  Echocardiogram 2D Echocardiogram has been performed.  Ronald Marsh 01/07/2022, 1:58 PM

## 2022-01-26 ENCOUNTER — Other Ambulatory Visit: Payer: Self-pay | Admitting: Nurse Practitioner

## 2022-01-26 DIAGNOSIS — E7800 Pure hypercholesterolemia, unspecified: Secondary | ICD-10-CM

## 2022-01-26 NOTE — Telephone Encounter (Signed)
Medication Refill - Medication: atorvastatin (LIPITOR) 80 MG tablet /BIDIL 20-37.5 MG tablet Patient is out of these meds  Has the patient contacted their pharmacy? yes (Agent: If no, request that the patient contact the pharmacy for the refill. If patient does not wish to contact the pharmacy document the reason why and proceed with request.) (Agent: If yes, when and what did the pharmacy advise?)contact pcp  Preferred Pharmacy (with phone number or street name):  [facility] [phone] - [place], [place] [address] [place] [address] Phone: [phone] Fax: [phone] Has the patient been seen for an appointment in the last year OR does the patient have an upcoming appointment? yes  Agent: Please be advised that RX refills may take up to 3 business days. We ask that you follow-up with your pharmacy.

## 2022-01-27 MED ORDER — ATORVASTATIN CALCIUM 80 MG PO TABS: ORAL_TABLET | ORAL | 0 refills | Status: DC

## 2022-01-27 NOTE — Telephone Encounter (Signed)
Requested Prescriptions  Pending Prescriptions Disp Refills  . atorvastatin (LIPITOR) 80 MG tablet 90 tablet 0    Sig: TAKE 1 TABLET(80 MG) BY MOUTH DAILY     Cardiovascular:  Antilipid - Statins Failed - 01/26/2022  3:32 PM      Failed - Lipid Panel in normal range within the last 12 months    Cholesterol, Total  Date Value Ref Range Status  09/20/2021 110 100 - 199 mg/dL Final   LDL Chol Calc (NIH)  Date Value Ref Range Status  09/20/2021 61 0 - 99 mg/dL Final   HDL  Date Value Ref Range Status  09/20/2021 36 (L) >39 mg/dL Final   Triglycerides  Date Value Ref Range Status  09/20/2021 59 0 - 149 mg/dL Final         Passed - Patient is not pregnant      Passed - Valid encounter within last 12 months    Recent Outpatient Visits          4 months ago Essential hypertension   [place], Vernia Buff, NP   6 months ago Essential hypertension   [place], Vernia Buff, NP   9 months ago Essential hypertension   [place], [place] W, NP   10 months ago Controlled type 2 diabetes mellitus with diabetic polyneuropathy, without long-term current use of insulin [facility])   [place], Vernia Buff, NP   1 year ago Essential hypertension   [place], Vernia Buff, NP      Future Appointments            In 1 month [name], NP [place]

## 2022-02-14 ENCOUNTER — Other Ambulatory Visit: Payer: Self-pay | Admitting: Family Medicine

## 2022-02-14 DIAGNOSIS — I5033 Acute on chronic diastolic (congestive) heart failure: Secondary | ICD-10-CM

## 2022-03-02 ENCOUNTER — Encounter: Payer: Self-pay | Admitting: Nurse Practitioner

## 2022-03-02 ENCOUNTER — Ambulatory Visit: Payer: Medicaid Other | Attending: Nurse Practitioner | Admitting: Nurse Practitioner

## 2022-03-02 ENCOUNTER — Other Ambulatory Visit (HOSPITAL_COMMUNITY): Payer: Self-pay

## 2022-03-02 ENCOUNTER — Other Ambulatory Visit (HOSPITAL_COMMUNITY): Payer: Self-pay | Admitting: *Deleted

## 2022-03-02 VITALS — BP 132/75 | HR 72 | Temp 98.0°F | Ht 74.0 in | Wt 201.0 lb

## 2022-03-02 DIAGNOSIS — R7303 Prediabetes: Secondary | ICD-10-CM | POA: Diagnosis not present

## 2022-03-02 DIAGNOSIS — E1142 Type 2 diabetes mellitus with diabetic polyneuropathy: Secondary | ICD-10-CM | POA: Diagnosis not present

## 2022-03-02 DIAGNOSIS — E7800 Pure hypercholesterolemia, unspecified: Secondary | ICD-10-CM | POA: Diagnosis not present

## 2022-03-02 DIAGNOSIS — I5033 Acute on chronic diastolic (congestive) heart failure: Secondary | ICD-10-CM

## 2022-03-02 LAB — POCT GLYCOSYLATED HEMOGLOBIN (HGB A1C): HbA1c, POC (controlled diabetic range): 6.4 % (ref 0.0–7.0)

## 2022-03-02 MED ORDER — BIDIL 20-37.5 MG PO TABS: ORAL_TABLET | ORAL | 6 refills | Status: DC

## 2022-03-02 NOTE — Progress Notes (Signed)
Assessment & Plan:  Ronald Marsh was seen today for hypertension.  Diagnoses and all orders for this visit:  Controlled type 2 diabetes mellitus with diabetic polyneuropathy, without long-term current use of insulin (HCC) -     POCT glycosylated hemoglobin (Hb A1C) Continue blood sugar control as discussed in office today, low carbohydrate diet, and regular physical exercise as tolerated, 150 minutes per week (30 min each day, 5 days per week, or 50 min 3 days per week). Keep blood sugar logs with fasting goal of 90-130 mg/dl, post prandial (after you eat) less than 180.  For Hypoglycemia: BS <60 and Hyperglycemia BS >400; contact the clinic ASAP. Annual eye exams and foot exams are recommended.   Prediabetes Continue metformin and farxiga  HTN Call Cardiology for refill of BIDIL ( may be switching from bidil to hydralazine per last cards note) Continue all antihypertensives as prescribed.  Reminded to bring in blood pressure log for follow  up appointment.  RECOMMENDATIONS: DASH/Mediterranean Diets are healthier choices for HTN.     Patient has been counseled on age-appropriate routine health concerns for screening and prevention. These are reviewed and up-to-date. Referrals have been placed accordingly. Immunizations are up-to-date or declined.    Subjective:   Chief Complaint  Patient presents with   Hypertension   Hypertension Pertinent negatives include no blurred vision, chest pain, headaches, malaise/fatigue, palpitations or shortness of breath.   Ronald Marsh Dec 61 y.o. male presents to office today for follow up to for HTN and DM.    He has a past medical history of DM2, Heart failure, Hepatitis C, History of CVA (02/2010), History of depression, Hyperlipidemia, Hypertension, Internal carotid artery stenosis, substance abuse (tobacco, cocaine) Stroke on plavix (2012), and Tobacco abuse.  HTN Blood pressure is well controlled. He is being followed by Cardiology. Taking  spironolactone 25 mg daily, amlodipine 10 mg daily, BIDIL 20-37.'5mg'$  BID (Needs refill), carvedilol 12.5 mg BID.  BP Readings from Last 3 Encounters:  03/02/22 132/75  12/28/21 110/78  12/16/21 126/79    DM 2 Well controlled with metformin 500 mg BID and farxiga 10 mg daily.  Lab Results  Component Value Date   HGBA1C 6.4 03/02/2022    LDL well controlled 80 mg daily.  Lab Results  Component Value Date   LDLCALC 61 09/20/2021    Review of Systems  Constitutional:  Negative for fever, malaise/fatigue and weight loss.  HENT: Negative.  Negative for nosebleeds.   Eyes: Negative.  Negative for blurred vision, double vision and photophobia.  Respiratory: Negative.  Negative for cough and shortness of breath.   Cardiovascular: Negative.  Negative for chest pain, palpitations and leg swelling.  Gastrointestinal: Negative.  Negative for heartburn, nausea and vomiting.  Musculoskeletal: Negative.  Negative for myalgias.  Neurological: Negative.  Negative for dizziness, focal weakness, seizures and headaches.  Psychiatric/Behavioral: Negative.  Negative for suicidal ideas.     Past Medical History:  Diagnosis Date   Diabetes mellitus without complication ([place])    Heart failure ([place])    Hepatitis C    History of CVA (cerebrovascular accident) 02/2010   Left periventricular subcortical ischemic infarction // Residual RUE and RLE weakness   History of depression    surrounding original stroke - has since resolved (04/2012)   Hyperlipidemia    Hypertension    Internal carotid artery stenosis    BL and mild per CT angiogram (05/2010)   Stroke ([place]) 2012   Tobacco abuse     Past Surgical History:  Procedure Laterality Date   NO PAST SURGERIES      Family History  Problem Relation Age of Onset   Diabetes Mother        on dialysis   Heart failure Mother    Hypertension Mother    Hyperlipidemia Mother    Glaucoma Mother    Lung cancer Father    Diabetes Sister    Colon polyps  Sister    Arthritis Sister    Hypertension Brother    Diabetes Brother    Pancreatic cancer Brother    Colon cancer Neg Hx    Esophageal cancer Neg Hx     Social History Reviewed with no changes to be made today.   Outpatient Medications Prior to Visit  Medication Sig Dispense Refill   Accu-Chek FastClix Lancets MISC Use as instructed. Inject into the skin once daily. E11.9 100 each 3   acetaminophen (TYLENOL) 500 MG tablet Take 1 tablet (500 mg total) by mouth every 6 (six) hours as needed for moderate pain. 30 tablet 0   amLODipine (NORVASC) 10 MG tablet Take 1 tablet (10 mg total) by mouth daily. 90 tablet 1   aspirin 81 MG tablet Take 1 tablet (81 mg total) by mouth daily. 30 tablet 5   atorvastatin (LIPITOR) 80 MG tablet TAKE 1 TABLET(80 MG) BY MOUTH DAILY 90 tablet 0   Blood Glucose Calibration (ACCU-CHEK GUIDE CONTROL) LIQD 1 each by In Vitro route once as needed for up to 1 dose. E11.9 1 each 0   Blood Pressure Monitor DEVI Please provide patient with insurance approved blood pressure monitor 1 Device 0   carvedilol (COREG) 12.5 MG tablet Take 1 tablet (12.5 mg total) by mouth 2 (two) times daily with a meal. 180 tablet 1   clopidogrel (PLAVIX) 75 MG tablet Take 1 tablet (75 mg total) by mouth daily. 90 tablet 1   dapagliflozin propanediol (FARXIGA) 10 MG TABS tablet Take 1 tablet (10 mg total) by mouth daily before breakfast. 90 tablet 1   EPINEPHrine 0.3 mg/0.3 mL IJ SOAJ injection Inject 0.3 mg into the muscle as needed for anaphylaxis. 1 each 0   gabapentin (NEURONTIN) 600 MG tablet Take 0.5-1 tablets (300-600 mg total) by mouth at bedtime. 90 tablet 0   glucose blood (ACCU-CHEK GUIDE) test strip Use as instructed. Check blood glucose by fingerstick once per day.  E11.9 100 each 12   metFORMIN (GLUCOPHAGE) 500 MG tablet Take 1 tablet (500 mg total) by mouth 2 (two) times daily with a meal. TAKE 2 TABLETS BY MOUTH EVERY MORNING WITH BREAKFAST THEN 1 TABLET BY MOUTH WITH DINNER  270 tablet 1   spironolactone (ALDACTONE) 25 MG tablet TAKE 1 TABLET(25 MG) BY MOUTH DAILY 30 tablet 0   BIDIL 20-37.5 MG tablet TAKE 2 TABLETS BY MOUTH IN THE MORNING AND AT BEDTIME (Patient not taking: Reported on 03/02/2022) 120 tablet 1   No facility-administered medications prior to visit.    Allergies  Allergen Reactions   Benicar [Olmesartan] Anaphylaxis and Swelling    Patient cannot name the site of swelling (??)   Ace Inhibitors Swelling    Patient cannot name the site of swelling (??)   Angiotensin Receptor Blockers Swelling    Patient cannot name the site of swelling (??)       Objective:    BP 132/75   Pulse 72   Temp 98 F (36.7 C) (Temporal)   Ht '6\' 2"'$  (1.88 m)   Wt 201 lb (91.2 kg)  SpO2 98%   BMI 25.81 kg/m  Wt Readings from Last 3 Encounters:  03/02/22 201 lb (91.2 kg)  12/28/21 198 lb 6.4 oz (90 kg)  09/20/21 208 lb 12.8 oz (94.7 kg)    Physical Exam Vitals and nursing note reviewed.  Constitutional:      Appearance: He is well-developed.  HENT:     Head: Normocephalic and atraumatic.  Cardiovascular:     Rate and Rhythm: Normal rate and regular rhythm.     Heart sounds: Normal heart sounds. No murmur heard.    No friction rub. No gallop.  Pulmonary:     Effort: Pulmonary effort is normal. No tachypnea or respiratory distress.     Breath sounds: Normal breath sounds. No decreased breath sounds, wheezing, rhonchi or rales.  Chest:     Chest wall: No tenderness.  Abdominal:     General: Bowel sounds are normal.     Palpations: Abdomen is soft.  Musculoskeletal:        General: Normal range of motion.     Cervical back: Normal range of motion.  Skin:    General: Skin is warm and dry.  Neurological:     Mental Status: He is alert and oriented to person, place, and time.     Coordination: Coordination normal.  Psychiatric:        Behavior: Behavior normal. Behavior is cooperative.        Thought Content: Thought content normal.         Judgment: Judgment normal.          Patient has been counseled extensively about nutrition and exercise as well as the importance of adherence with medications and regular follow-up. The patient was given clear instructions to go to ER or return to medical center if symptoms don't improve, worsen or new problems develop. The patient verbalized understanding.   Follow-up: Return in about 3 months (around 06/02/2022) for HTN/.   [name], FNP-BC [facility] and [facility], [place]   03/02/2022, 2:32 PM

## 2022-03-11 ENCOUNTER — Other Ambulatory Visit: Payer: Self-pay | Admitting: Family Medicine

## 2022-03-11 DIAGNOSIS — I5033 Acute on chronic diastolic (congestive) heart failure: Secondary | ICD-10-CM

## 2022-03-21 ENCOUNTER — Other Ambulatory Visit: Payer: Self-pay | Admitting: Family Medicine

## 2022-03-21 DIAGNOSIS — I5033 Acute on chronic diastolic (congestive) heart failure: Secondary | ICD-10-CM

## 2022-03-28 ENCOUNTER — Encounter (HOSPITAL_COMMUNITY): Payer: Medicaid Other

## 2022-03-28 NOTE — Progress Notes (Incomplete)
ADVANCED HF CLINIC NOTE  Referring Physician: Dr. Joya Gaskins HF Cardiologist: DB  HPI:  Mr..Sevigny is a 61 y/o male with multiple medical problems including substance abuse (tobacco, cocaine), HCV (s/p treatment), HTN, HL, DM2, previous TIA and  h/o systolic HF dating back to 2011. Echo 02/2010 with EF 50-55%  Admitted to [facility] 9/19 with acute HF. EF 25-30%. Had cath. No CAD.   Admitted to [facility] 12/19 with recurrent HF and HTN. Echo done 07/03/18 EF 25-30%   Echo 12/20 EF 40-45% RV ok Personally reviewed  Had angioedema with Benicar so not on ACE/ARNI/ARB  Last ECHO 01/20/2020 EF 45-50%.  RV okay  Feeling good today.  Still very active and walking a lot. No longer smoking cigarettes, only occasional THC use.  No alcohol use.  Checks BP at home intermittently, states it's "never too high".  Denies chest pain, dyspnea or edema.    Past Medical History:  Diagnosis Date   Diabetes mellitus without complication ([place])    Heart failure ([place])    Hepatitis C    History of CVA (cerebrovascular accident) 02/2010   Left periventricular subcortical ischemic infarction // Residual RUE and RLE weakness   History of depression    surrounding original stroke - has since resolved (04/2012)   Hyperlipidemia    Hypertension    Internal carotid artery stenosis    BL and mild per CT angiogram (05/2010)   Stroke ([place]) 2012   Tobacco abuse     Current Outpatient Medications  Medication Sig Dispense Refill   Accu-Chek FastClix Lancets MISC Use as instructed. Inject into the skin once daily. E11.9 100 each 3   acetaminophen (TYLENOL) 500 MG tablet Take 1 tablet (500 mg total) by mouth every 6 (six) hours as needed for moderate pain. 30 tablet 0   amLODipine (NORVASC) 10 MG tablet Take 1 tablet (10 mg total) by mouth daily. 90 tablet 1   aspirin 81 MG tablet Take 1 tablet (81 mg total) by mouth daily. 30 tablet 5   atorvastatin (LIPITOR) 80 MG tablet TAKE 1 TABLET(80 MG) BY MOUTH DAILY 90 tablet 0    BIDIL 20-37.5 MG tablet TAKE 2 TABLETS BY MOUTH IN THE MORNING AND AT BEDTIME 120 tablet 6   Blood Glucose Calibration (ACCU-CHEK GUIDE CONTROL) LIQD 1 each by In Vitro route once as needed for up to 1 dose. E11.9 1 each 0   Blood Pressure Monitor DEVI Please provide patient with insurance approved blood pressure monitor 1 Device 0   carvedilol (COREG) 12.5 MG tablet Take 1 tablet (12.5 mg total) by mouth 2 (two) times daily with a meal. 180 tablet 1   dapagliflozin propanediol (FARXIGA) 10 MG TABS tablet Take 1 tablet (10 mg total) by mouth daily before breakfast. 90 tablet 1   EPINEPHrine 0.3 mg/0.3 mL IJ SOAJ injection Inject 0.3 mg into the muscle as needed for anaphylaxis. 1 each 0   gabapentin (NEURONTIN) 600 MG tablet Take 0.5-1 tablets (300-600 mg total) by mouth at bedtime. 90 tablet 0   glucose blood (ACCU-CHEK GUIDE) test strip Use as instructed. Check blood glucose by fingerstick once per day.  E11.9 100 each 12   metFORMIN (GLUCOPHAGE) 500 MG tablet Take 1 tablet (500 mg total) by mouth 2 (two) times daily with a meal. TAKE 2 TABLETS BY MOUTH EVERY MORNING WITH BREAKFAST THEN 1 TABLET BY MOUTH WITH DINNER 270 tablet 1   spironolactone (ALDACTONE) 25 MG tablet TAKE 1 TABLET(25 MG) BY MOUTH DAILY 90 tablet  1   No current facility-administered medications for this visit.    Allergies  Allergen Reactions   Benicar [Olmesartan] Anaphylaxis and Swelling    Patient cannot name the site of swelling (??)   Ace Inhibitors Swelling    Patient cannot name the site of swelling (??)   Angiotensin Receptor Blockers Swelling    Patient cannot name the site of swelling (??)      Social History   Socioeconomic History   Marital status: Single    Spouse name: Not on file   Number of children: 1   Years of education: 11th grade   Highest education level: Not on file  Occupational History   Occupation: On disability    Comment: previously worked for [employer] until his stroke in  2011.   Tobacco Use   Smoking status: Former    Packs/day: 0.30    Years: 31.00    Total pack years: 9.30    Types: Cigarettes    Start date: 04/25/1981    Quit date: 05/27/2021    Years since quitting: 0.8   Smokeless tobacco: Never  Vaping Use   Vaping Use: Never used  Substance and Sexual Activity   Alcohol use: Not Currently   Drug use: No    Comment: Former smoke crack    Sexual activity: Yes  Other Topics Concern   Not on file  Social History Narrative   Lives in [place] with wife.   Has been incarcerated multiple times, last time was in 2012 for 8 months.   Social Determinants of Health   Financial Resource Strain: Not on file  Food Insecurity: No Food Insecurity (01/22/2021)   Hunger Vital Sign    Worried About Running Out of Food in the Last Year: Never true    Ran Out of Food in the Last Year: Never true  Transportation Needs: Not on file  Physical Activity: Not on file  Stress: Not on file  Social Connections: Not on file  Intimate Partner Violence: Not on file      Family History  Problem Relation Age of Onset   Diabetes Mother        on dialysis   Heart failure Mother    Hypertension Mother    Hyperlipidemia Mother    Glaucoma Mother    Lung cancer Father    Diabetes Sister    Colon polyps Sister    Arthritis Sister    Hypertension Brother    Diabetes Brother    Pancreatic cancer Brother    Colon cancer Neg Hx    Esophageal cancer Neg Hx     There were no vitals filed for this visit.    PHYSICAL EXAM: General:  Well appearing. No resp difficulty HEENT: normal Neck: supple. no JVD. Carotids 2+ bilat; no bruits. No lymphadenopathy or thryomegaly appreciated. Cor: PMI nondisplaced. Regular rate & rhythm. No rubs, gallops or murmurs. Lungs: clear but decreased throughout Abdomen: soft, nontender, nondistended. No hepatosplenomegaly. No bruits or masses. Good bowel sounds. Extremities: no cyanosis, clubbing, rash, edema Neuro: alert &  orientedx3, cranial nerves grossly intact. moves all 4 extremities w/o difficulty. Affect pleasant   ASSESSMENT & PLAN:  1. Chronic systolic HF due to NICM - Echo 9/19 EF 25-30%, Echo 2/20 EF 25-30 - Cath 9/19 [facility]. No CAD - Echo 12/20 EF improved to 40-45% - Last ECHO 01/20/2020 EF 45-50%.  RV okay - Suspect due to HTN and substance use - Doing well. NYHA I - Volume status looks  good.  - Continue carvedilol 12.5 bid - Continue spiro 25 daily. - No ACE/ARB/ARNI with angioedema to Benicar in past - Continue Bidil 2 tabs tid - Continue Farxiga '10mg'$  daily - Denies further use of ETOH. Intermittent THC use - BMET today - Suspect EF may have recovered. Repeat echo  2. HTN - Blood pressure well controlled. Continue current regimen.  3. Substance abuse - Denies ETOH/cocaine. Occasional THC use.   4. CVA - Continue DAPT and statin  - Minimal residual deficit   5. HCV - treated. last VL undetectable   6. Tobacco use - remains quit  7. ED - wants to use PDE5i but unable to with Bidil use - will await echo. If EF improved can consider switching Bidil to hydralazine alone if he really wants to consider PDE5i use - though he realizes this would compromise the heart benefit of Bidil.     Rafael Bihari, FNP  8:06 AM

## 2022-03-29 ENCOUNTER — Other Ambulatory Visit: Payer: Self-pay | Admitting: Nurse Practitioner

## 2022-03-29 DIAGNOSIS — E1142 Type 2 diabetes mellitus with diabetic polyneuropathy: Secondary | ICD-10-CM

## 2022-03-29 MED ORDER — DAPAGLIFLOZIN PROPANEDIOL 10 MG PO TABS: 10.0000 mg | ORAL_TABLET | Freq: Every day | ORAL | 0 refills | Status: DC

## 2022-03-29 NOTE — Telephone Encounter (Signed)
Clopidogrel 75 MG is not on current list, routing for review.

## 2022-03-29 NOTE — Telephone Encounter (Signed)
Pt called in to request a refill on 2 medications. Pt says they have been requested via his pharmacy but not showing in system.    Clopidogrel 75 MG  dapagliflozin propanediol (FARXIGA) 10 MG TABS tablet    Pharmacy: [facility] [phone] - [address], [address] AT [address], [place] [address] Phone: [phone]  Fax: [phone]    Appt: 06/03/22

## 2022-04-10 ENCOUNTER — Other Ambulatory Visit: Payer: Self-pay | Admitting: Nurse Practitioner

## 2022-04-10 DIAGNOSIS — E7800 Pure hypercholesterolemia, unspecified: Secondary | ICD-10-CM

## 2022-04-11 NOTE — Telephone Encounter (Signed)
Requested Prescriptions  Pending Prescriptions Disp Refills   atorvastatin (LIPITOR) 80 MG tablet [Pharmacy Med Name: ATORVASTATIN '80MG'$  TABLETS] 90 tablet 1    Sig: TAKE 1 TABLET(80 MG) BY MOUTH DAILY     Cardiovascular:  Antilipid - Statins Failed - 04/10/2022 10:39 AM      Failed - Lipid Panel in normal range within the last 12 months    Cholesterol, Total  Date Value Ref Range Status  09/20/2021 110 100 - 199 mg/dL Final   LDL Chol Calc (NIH)  Date Value Ref Range Status  09/20/2021 61 0 - 99 mg/dL Final   HDL  Date Value Ref Range Status  09/20/2021 36 (L) >39 mg/dL Final   Triglycerides  Date Value Ref Range Status  09/20/2021 59 0 - 149 mg/dL Final         Passed - Patient is not pregnant      Passed - Valid encounter within last 12 months    Recent Outpatient Visits           1 month ago Controlled type 2 diabetes mellitus with diabetic polyneuropathy, without long-term current use of insulin ([place])   [place], Vernia Buff, NP   6 months ago Essential hypertension   [place], Vernia Buff, NP   9 months ago Essential hypertension   [place], Vernia Buff, NP   12 months ago Essential hypertension   [place], [place] W, NP   1 year ago Controlled type 2 diabetes mellitus with diabetic polyneuropathy, without long-term current use of insulin [facility])   [place], Vernia Buff, NP       Future Appointments             In 1 month [name], NP [place]

## 2022-04-19 ENCOUNTER — Other Ambulatory Visit: Payer: Self-pay | Admitting: Nurse Practitioner

## 2022-04-19 DIAGNOSIS — I10 Essential (primary) hypertension: Secondary | ICD-10-CM

## 2022-04-19 MED ORDER — AMLODIPINE BESYLATE 10 MG PO TABS: 10.0000 mg | ORAL_TABLET | Freq: Every day | ORAL | 0 refills | Status: DC

## 2022-04-19 NOTE — Telephone Encounter (Signed)
Requested Prescriptions  Pending Prescriptions Disp Refills   amLODipine (NORVASC) 10 MG tablet 90 tablet 0    Sig: Take 1 tablet (10 mg total) by mouth daily.     Cardiovascular: Calcium Channel Blockers 2 Passed - 04/19/2022 12:03 PM      Passed - Last BP in normal range    BP Readings from Last 1 Encounters:  03/02/22 132/75         Passed - Last Heart Rate in normal range    Pulse Readings from Last 1 Encounters:  03/02/22 72         Passed - Valid encounter within last 6 months    Recent Outpatient Visits           1 month ago Controlled type 2 diabetes mellitus with diabetic polyneuropathy, without long-term current use of insulin ([place])   [place], Vernia Buff, NP   7 months ago Essential hypertension   [place], Vernia Buff, NP   9 months ago Essential hypertension   [place], Vernia Buff, NP   1 year ago Essential hypertension   [place], [place] W, NP   1 year ago Controlled type 2 diabetes mellitus with diabetic polyneuropathy, without long-term current use of insulin [facility])   [place], Vernia Buff, NP       Future Appointments             In 1 month [name], NP [place]

## 2022-04-19 NOTE — Telephone Encounter (Signed)
Medication Refill - Medication: amLODipine (NORVASC) 10 MG tablet  Has the patient contacted their pharmacy? Yes.     Preferred Pharmacy (with phone number or street name):  [facility] [phone] - [place], [address] AT [address] Phone: [phone]  Fax: [phone]     Has the patient been seen for an appointment in the last year OR does the patient have an upcoming appointment? Yes.    He also is inquiring about his refill on his Clopidogrel stating he is out of that as well but it is crossed off his list in Epic. Please assist patient further

## 2022-04-20 NOTE — Progress Notes (Incomplete)
ADVANCED HF CLINIC NOTE  Referring Physician: Dr. Joya Gaskins HF Cardiologist: DB  HPI:  Ronald Marsh is a 61 y/o male with multiple medical problems including substance abuse (tobacco, cocaine), HCV (s/p treatment), HTN, HL, DM2, previous TIA and  h/o systolic HF dating back to 2011. Echo 02/2010 with EF 50-55%  Admitted to [facility] 9/19 with acute HF. EF 25-30%. Had cath. No CAD.   Admitted to [facility] 12/19 with recurrent HF and HTN. Echo done 07/03/18 EF 25-30%   Echo 12/20 EF 40-45% RV ok Personally reviewed  Had angioedema with Benicar so not on ACE/ARNI/ARB  ECHO 01/20/2020 EF 45-50% RV okay  Last seen for f/u 08/23. Patient wanted to consider PDE5i. Discussed possibly switching bidil to hydralazine if EF recovered.  Echo 01/07/22: EF 50-55%, LV cavity trabeculated with inferior basal/apical hypokinesis, RV okay     Past Medical History:  Diagnosis Date   Diabetes mellitus without complication ([place])    Heart failure ([place])    Hepatitis C    History of CVA (cerebrovascular accident) 02/2010   Left periventricular subcortical ischemic infarction // Residual RUE and RLE weakness   History of depression    surrounding original stroke - has since resolved (04/2012)   Hyperlipidemia    Hypertension    Internal carotid artery stenosis    BL and mild per CT angiogram (05/2010)   Stroke ([place]) 2012   Tobacco abuse     Current Outpatient Medications  Medication Sig Dispense Refill   Accu-Chek FastClix Lancets MISC Use as instructed. Inject into the skin once daily. E11.9 100 each 3   acetaminophen (TYLENOL) 500 MG tablet Take 1 tablet (500 mg total) by mouth every 6 (six) hours as needed for moderate pain. 30 tablet 0   amLODipine (NORVASC) 10 MG tablet Take 1 tablet (10 mg total) by mouth daily. 90 tablet 0   aspirin 81 MG tablet Take 1 tablet (81 mg total) by mouth daily. 30 tablet 5   atorvastatin (LIPITOR) 80 MG tablet TAKE 1 TABLET(80 MG) BY MOUTH DAILY 90 tablet 1   BIDIL  20-37.5 MG tablet TAKE 2 TABLETS BY MOUTH IN THE MORNING AND AT BEDTIME 120 tablet 6   Blood Glucose Calibration (ACCU-CHEK GUIDE CONTROL) LIQD 1 each by In Vitro route once as needed for up to 1 dose. E11.9 1 each 0   Blood Pressure Monitor DEVI Please provide patient with insurance approved blood pressure monitor 1 Device 0   carvedilol (COREG) 12.5 MG tablet Take 1 tablet (12.5 mg total) by mouth 2 (two) times daily with a meal. 180 tablet 1   dapagliflozin propanediol (FARXIGA) 10 MG TABS tablet Take 1 tablet (10 mg total) by mouth daily before breakfast. 90 tablet 0   EPINEPHrine 0.3 mg/0.3 mL IJ SOAJ injection Inject 0.3 mg into the muscle as needed for anaphylaxis. 1 each 0   gabapentin (NEURONTIN) 600 MG tablet Take 0.5-1 tablets (300-600 mg total) by mouth at bedtime. 90 tablet 0   glucose blood (ACCU-CHEK GUIDE) test strip Use as instructed. Check blood glucose by fingerstick once per day.  E11.9 100 each 12   metFORMIN (GLUCOPHAGE) 500 MG tablet Take 1 tablet (500 mg total) by mouth 2 (two) times daily with a meal. TAKE 2 TABLETS BY MOUTH EVERY MORNING WITH BREAKFAST THEN 1 TABLET BY MOUTH WITH DINNER 270 tablet 1   spironolactone (ALDACTONE) 25 MG tablet TAKE 1 TABLET(25 MG) BY MOUTH DAILY 90 tablet 1   No current facility-administered medications for this  visit.    Allergies  Allergen Reactions   Benicar [Olmesartan] Anaphylaxis and Swelling    Patient cannot name the site of swelling (??)   Ace Inhibitors Swelling    Patient cannot name the site of swelling (??)   Angiotensin Receptor Blockers Swelling    Patient cannot name the site of swelling (??)      Social History   Socioeconomic History   Marital status: Single    Spouse name: Not on file   Number of children: 1   Years of education: 11th grade   Highest education level: Not on file  Occupational History   Occupation: On disability    Comment: previously worked for [employer] until his stroke in 2011.    Tobacco Use   Smoking status: Former    Packs/day: 0.30    Years: 31.00    Total pack years: 9.30    Types: Cigarettes    Start date: 04/25/1981    Quit date: 05/27/2021    Years since quitting: 0.8   Smokeless tobacco: Never  Vaping Use   Vaping Use: Never used  Substance and Sexual Activity   Alcohol use: Not Currently   Drug use: No    Comment: Former smoke crack    Sexual activity: Yes  Other Topics Concern   Not on file  Social History Narrative   Lives in [place] with wife.   Has been incarcerated multiple times, last time was in 2012 for 8 months.   Social Determinants of Health   Financial Resource Strain: Not on file  Food Insecurity: No Food Insecurity (01/22/2021)   Hunger Vital Sign    Worried About Running Out of Food in the Last Year: Never true    Ran Out of Food in the Last Year: Never true  Transportation Needs: Not on file  Physical Activity: Not on file  Stress: Not on file  Social Connections: Not on file  Intimate Partner Violence: Not on file      Family History  Problem Relation Age of Onset   Diabetes Mother        on dialysis   Heart failure Mother    Hypertension Mother    Hyperlipidemia Mother    Glaucoma Mother    Lung cancer Father    Diabetes Sister    Colon polyps Sister    Arthritis Sister    Hypertension Brother    Diabetes Brother    Pancreatic cancer Brother    Colon cancer Neg Hx    Esophageal cancer Neg Hx     There were no vitals filed for this visit.    PHYSICAL EXAM: General:  Well appearing. No resp difficulty HEENT: normal Neck: supple. no JVD. Carotids 2+ bilat; no bruits. No lymphadenopathy or thryomegaly appreciated. Cor: PMI nondisplaced. Regular rate & rhythm. No rubs, gallops or murmurs. Lungs: clear but decreased throughout Abdomen: soft, nontender, nondistended. No hepatosplenomegaly. No bruits or masses. Good bowel sounds. Extremities: no cyanosis, clubbing, rash, edema Neuro: alert &  orientedx3, cranial nerves grossly intact. moves all 4 extremities w/o difficulty. Affect pleasant   ASSESSMENT & PLAN:  1. Chronic systolic HF due to NICM - Echo 9/19 EF 25-30% - Echo 2/20 EF 25-30 - Cath 9/19 [facility]. No CAD - Echo 12/20 EF improved to 40-45% - ECHO 01/20/2020 EF 45-50%.  RV okay - Echo 09/23: EF 50-55%, LV cavity trabeculated, RV okay - Suspect due to HTN and substance use - NYHA ***. Volume *** - Continue carvedilol  12.5 bid - Continue spiro 25 daily. - No ACE/ARB/ARNI with angioedema to Benicar in past - Continue Bidil 2 tabs tid - Continue Farxiga '10mg'$  daily - Denies further use of ETOH. Intermittent THC use - BMET today  2. HTN - Blood pressure well controlled. Continue current regimen.  3. Substance abuse - Denies ETOH/cocaine. Occasional THC use.   4. CVA - Continue DAPT and statin  - Minimal residual deficit   5. HCV - treated. last VL undetectable   6. Tobacco use - has quit  7. ED - wants to use PDE5i but unable to with Bidil use - will await echo. If EF improved can consider switching Bidil to hydralazine alone if he really wants to consider PDE5i use - though he realizes this would compromise the heart benefit of Bidil.    Follow-up: ***  Kendon Sedeno N, PA-C  4:09 PM

## 2022-04-21 ENCOUNTER — Encounter (HOSPITAL_COMMUNITY): Payer: Self-pay

## 2022-04-21 ENCOUNTER — Ambulatory Visit (HOSPITAL_COMMUNITY)
Admission: RE | Admit: 2022-04-21 | Discharge: 2022-04-21 | Disposition: A | Discharge status: Home / Self Care | Payer: Medicaid Other | Source: Ambulatory Visit | Attending: Physician Assistant | Admitting: Physician Assistant

## 2022-04-21 VITALS — BP 110/80 | HR 79 | Wt 202.0 lb

## 2022-04-21 DIAGNOSIS — I5022 Chronic systolic (congestive) heart failure: Secondary | ICD-10-CM

## 2022-04-21 DIAGNOSIS — Z7984 Long term (current) use of oral hypoglycemic drugs: Secondary | ICD-10-CM | POA: Insufficient documentation

## 2022-04-21 DIAGNOSIS — I428 Other cardiomyopathies: Secondary | ICD-10-CM | POA: Diagnosis not present

## 2022-04-21 DIAGNOSIS — I10 Essential (primary) hypertension: Secondary | ICD-10-CM

## 2022-04-21 DIAGNOSIS — Z79899 Other long term (current) drug therapy: Secondary | ICD-10-CM | POA: Diagnosis not present

## 2022-04-21 DIAGNOSIS — Z8673 Personal history of transient ischemic attack (TIA), and cerebral infarction without residual deficits: Secondary | ICD-10-CM | POA: Insufficient documentation

## 2022-04-21 DIAGNOSIS — Z7982 Long term (current) use of aspirin: Secondary | ICD-10-CM | POA: Diagnosis not present

## 2022-04-21 DIAGNOSIS — I639 Cerebral infarction, unspecified: Secondary | ICD-10-CM

## 2022-04-21 DIAGNOSIS — I110 Hypertensive heart disease with heart failure: Secondary | ICD-10-CM | POA: Diagnosis not present

## 2022-04-21 DIAGNOSIS — Z87891 Personal history of nicotine dependence: Secondary | ICD-10-CM | POA: Diagnosis not present

## 2022-04-21 LAB — BASIC METABOLIC PANEL
Anion gap: 8 (ref 5–15)
BUN: 9 mg/dL (ref 6–20)
CO2: 25 mmol/L (ref 22–32)
Calcium: 9.2 mg/dL (ref 8.9–10.3)
Chloride: 105 mmol/L (ref 98–111)
Creatinine, Ser: 0.83 mg/dL (ref 0.61–1.24)
GFR, Estimated: >60 mL/min (ref >60)
Glucose, Bld: 134 mg/dL — ABNORMAL HIGH (ref 70–99)
Potassium: 3.8 mmol/L (ref 3.5–5.1)
Sodium: 138 mmol/L (ref 135–145)

## 2022-04-21 LAB — BRAIN NATRIURETIC PEPTIDE: B Natriuretic Peptide: 12.9 pg/mL (ref 0.0–100.0)

## 2022-04-21 NOTE — Addendum Note (Signed)
Encounter addended by: Kerry Dory, CMA on: 04/21/2022 10:23 AM  Actions taken: Actions taken from a BestPractice Advisory, Order list changed, Diagnosis association updated

## 2022-04-21 NOTE — Patient Instructions (Signed)
Medication Changes:  No medication changes this visit.  Lab Work:  We did [employer] today. If you have MyChart, you will be able to see results. We will only call if anything abnormal or needs changes.  Testing/Procedures:  No testing today  Referrals:  N/A  Special Instructions // Education:  N/A  Follow-Up in: Follow up with Dr. Missy Sabins in 6 months. If you do not hear from [place] by April, please call the office to schedule.  At the [facility], you and your health needs are our priority. We have a designated team specialized in the treatment of Heart Failure. This Care Team includes your primary Heart Failure Specialized Cardiologist (physician), Advanced Practice Providers (APPs- Physician Assistants and Nurse Practitioners), and Pharmacist who all work together to provide you with the care you need, when you need it.   You may see any of the following providers on your designated Care Team at your next follow up:  Dr. Glori Bickers Dr. Loralie Champagne Dr. Roxana Hires, NP Lyda Jester, [place] [facility] [place], [place] Forestine Na, NP Audry Riles, PharmD   Please be sure to bring in all your medications bottles to every appointment.   Need to Contact us:  If you have any questions or concerns before your next appointment please send [place] a message through [place] or call our office at [phone].    TO LEAVE A MESSAGE FOR THE NURSE SELECT OPTION 2, PLEASE LEAVE A MESSAGE INCLUDING: YOUR NAME DATE OF BIRTH CALL BACK NUMBER REASON FOR CALL**this is important as we prioritize the call backs  YOU WILL RECEIVE A CALL BACK THE SAME DAY AS LONG AS YOU CALL BEFORE 4:00 PM

## 2022-04-21 NOTE — Addendum Note (Signed)
Encounter addended by: Joette Catching, PA-C on: 04/21/2022 10:18 AM  Actions taken: Clinical Note Signed, Visit diagnoses modified

## 2022-05-19 ENCOUNTER — Other Ambulatory Visit: Payer: Self-pay

## 2022-05-19 ENCOUNTER — Emergency Department (HOSPITAL_COMMUNITY)
Admission: EM | Admit: 2022-05-19 | Discharge: 2022-05-19 | Disposition: A | Discharge status: Home / Self Care | Payer: Medicaid Other | Attending: Emergency Medicine | Admitting: Emergency Medicine

## 2022-05-19 ENCOUNTER — Encounter (HOSPITAL_COMMUNITY): Payer: Self-pay

## 2022-05-19 DIAGNOSIS — R509 Fever, unspecified: Secondary | ICD-10-CM | POA: Insufficient documentation

## 2022-05-19 DIAGNOSIS — R0989 Other specified symptoms and signs involving the circulatory and respiratory systems: Secondary | ICD-10-CM | POA: Insufficient documentation

## 2022-05-19 DIAGNOSIS — I110 Hypertensive heart disease with heart failure: Secondary | ICD-10-CM | POA: Diagnosis not present

## 2022-05-19 DIAGNOSIS — E119 Type 2 diabetes mellitus without complications: Secondary | ICD-10-CM | POA: Insufficient documentation

## 2022-05-19 DIAGNOSIS — R5383 Other fatigue: Secondary | ICD-10-CM | POA: Insufficient documentation

## 2022-05-19 DIAGNOSIS — J111 Influenza due to unidentified influenza virus with other respiratory manifestations: Secondary | ICD-10-CM | POA: Diagnosis not present

## 2022-05-19 DIAGNOSIS — R0981 Nasal congestion: Secondary | ICD-10-CM | POA: Diagnosis not present

## 2022-05-19 DIAGNOSIS — Z7984 Long term (current) use of oral hypoglycemic drugs: Secondary | ICD-10-CM | POA: Diagnosis not present

## 2022-05-19 DIAGNOSIS — Z79899 Other long term (current) drug therapy: Secondary | ICD-10-CM | POA: Diagnosis not present

## 2022-05-19 DIAGNOSIS — Z7982 Long term (current) use of aspirin: Secondary | ICD-10-CM | POA: Insufficient documentation

## 2022-05-19 DIAGNOSIS — R059 Cough, unspecified: Secondary | ICD-10-CM | POA: Insufficient documentation

## 2022-05-19 DIAGNOSIS — M791 Myalgia, unspecified site: Secondary | ICD-10-CM | POA: Insufficient documentation

## 2022-05-19 DIAGNOSIS — I509 Heart failure, unspecified: Secondary | ICD-10-CM | POA: Insufficient documentation

## 2022-05-19 DIAGNOSIS — Z1152 Encounter for screening for COVID-19: Secondary | ICD-10-CM | POA: Insufficient documentation

## 2022-05-19 DIAGNOSIS — R630 Anorexia: Secondary | ICD-10-CM | POA: Insufficient documentation

## 2022-05-19 LAB — RESP PANEL BY RT-PCR (RSV, FLU A&B, COVID)  RVPGX2
Influenza A by PCR: NEGATIVE
Influenza B by PCR: NEGATIVE
Resp Syncytial Virus by PCR: NEGATIVE
SARS Coronavirus 2 by RT PCR: NEGATIVE

## 2022-05-19 NOTE — ED Provider Triage Note (Signed)
Emergency Medicine Provider Triage Evaluation Note  Ronald Marsh , a 62 y.o. male  was evaluated in triage.  Pt complains of dry cough, fatigue, loss of appetite, subjective fevers x 1 week. His fiancee tested positive for flu just before symptom onset. Denies chest pain or shortness of breath.  Review of Systems  Positive: As above Negative: As above  Physical Exam  BP (!) 133/90 (BP Location: Right Arm)   Pulse 92   Temp 98.5 F (36.9 C)   Resp 18   SpO2 98%  Gen:   Awake, no distress   Resp:  Normal effort  MSK:   Moves extremities without difficulty  Other:  Posterior oropharynx normal  Medical Decision Making  Medically screening exam initiated at 12:58 PM.  Appropriate orders placed.  Jayko Voorhees was informed that the remainder of the evaluation will be completed by another provider, this initial triage assessment does not replace that evaluation, and the importance of remaining in the ED until their evaluation is complete.  Respiratory panel   Roylene Reason, PA-C 05/19/22 1259

## 2022-05-19 NOTE — ED Provider Notes (Signed)
[facility] EMERGENCY DEPARTMENT Provider Note   CSN: [id number] Arrival date & time: 05/19/22  1244     History  Chief Complaint  Patient presents with   Cough    Ronald Marsh is a 62 y.o. male.  With a history of hypertension, diabetes, CHF who presents ED for evaluation of dry cough, body aches, fatigue, loss of appetite, subjective fevers, congestion, rhinorrhea.  Symptoms have been present for 1 week and have not changed.  States his fiance tested positive for flu just prior to when his symptoms began.  Came in today because he thought he could treated on his own he symptoms are not changed.  Denies chest pain or shortness of breath.  Has not taken anything for symptoms at home.   Cough Associated symptoms: rhinorrhea        Home Medications Prior to Admission medications   Medication Sig Start Date End Date Taking? Authorizing Provider  Accu-Chek FastClix Lancets MISC Use as instructed. Inject into the skin once daily. E11.9 01/27/20   [name], NP  acetaminophen (TYLENOL) 500 MG tablet Take 1 tablet (500 mg total) by mouth every 6 (six) hours as needed for moderate pain. 09/09/19   Corena Herter, PA-C  amLODipine (NORVASC) 10 MG tablet Take 1 tablet (10 mg total) by mouth daily. 04/19/22   [name], NP  aspirin 81 MG tablet Take 1 tablet (81 mg total) by mouth daily. 06/12/18   Elsie Stain, MD  atorvastatin (LIPITOR) 80 MG tablet TAKE 1 TABLET(80 MG) BY MOUTH DAILY 04/11/22   [name], NP  BIDIL 20-37.5 MG tablet TAKE 2 TABLETS BY MOUTH IN THE MORNING AND AT BEDTIME 03/02/22   Bensimhon, Shaune Pascal, MD  Blood Glucose Calibration (ACCU-CHEK GUIDE CONTROL) LIQD 1 each by In Vitro route once as needed for up to 1 dose. E11.9 10/05/18   [name], NP  Blood Pressure Monitor DEVI Please provide patient with insurance approved blood pressure monitor 10/14/18   [name], NP  carvedilol (COREG) 12.5 MG tablet Take 1 tablet  (12.5 mg total) by mouth 2 (two) times daily with a meal. 09/20/21 03/19/22  [name], NP  dapagliflozin propanediol (FARXIGA) 10 MG TABS tablet Take 1 tablet (10 mg total) by mouth daily before breakfast. 03/29/22   Charlott Rakes, MD  EPINEPHrine 0.3 mg/0.3 mL IJ SOAJ injection Inject 0.3 mg into the muscle as needed for anaphylaxis. 12/16/21   Sherwood Gambler, MD  gabapentin (NEURONTIN) 600 MG tablet Take 0.5-1 tablets (300-600 mg total) by mouth at bedtime. 07/13/21 12/29/22  [name], NP  glucose blood (ACCU-CHEK GUIDE) test strip Use as instructed. Check blood glucose by fingerstick once per day.  E11.9 03/12/19   [name], NP  metFORMIN (GLUCOPHAGE) 500 MG tablet Take 1 tablet (500 mg total) by mouth 2 (two) times daily with a meal. TAKE 2 TABLETS BY MOUTH EVERY MORNING WITH BREAKFAST THEN 1 TABLET BY MOUTH WITH DINNER 09/20/21   [name], NP  spironolactone (ALDACTONE) 25 MG tablet TAKE 1 TABLET(25 MG) BY MOUTH DAILY 03/22/22   Charlott Rakes, MD      Allergies    Benicar [olmesartan], Ace inhibitors, and Angiotensin receptor blockers    Review of Systems   Review of Systems  Constitutional:  Positive for fatigue.  HENT:  Positive for congestion and rhinorrhea.   Respiratory:  Positive for cough.   All other systems reviewed and are negative.  Physical Exam Updated Vital Signs BP (!) 133/90 (BP Location: Right Arm)   Pulse 92   Temp 98.5 F (36.9 C)   Resp 18   SpO2 98%  Physical Exam Vitals and nursing note reviewed.  Constitutional:      General: He is not in acute distress.    Appearance: Normal appearance. He is well-developed. He is not ill-appearing, toxic-appearing or diaphoretic.  HENT:     Head: Normocephalic and atraumatic.     Right Ear: Tympanic membrane normal.     Left Ear: Tympanic membrane normal.     Nose: Congestion and rhinorrhea present.     Mouth/Throat:     Mouth: Mucous membranes are moist.     Pharynx: Oropharynx is  clear. No oropharyngeal exudate or posterior oropharyngeal erythema.  Eyes:     Conjunctiva/sclera: Conjunctivae normal.  Cardiovascular:     Rate and Rhythm: Normal rate and regular rhythm.     Heart sounds: No murmur heard. Pulmonary:     Effort: Pulmonary effort is normal. No respiratory distress.     Breath sounds: Normal breath sounds. No stridor. No wheezing, rhonchi or rales.  Abdominal:     Palpations: Abdomen is soft.     Tenderness: There is no abdominal tenderness.  Musculoskeletal:        General: No swelling.     Cervical back: Normal range of motion and neck supple. No rigidity.     Right lower leg: No edema.     Left lower leg: No edema.  Skin:    General: Skin is warm and dry.     Capillary Refill: Capillary refill takes less than 2 seconds.  Neurological:     General: No focal deficit present.     Mental Status: He is alert and oriented to person, place, and time.  Psychiatric:        Mood and Affect: Mood normal.        Behavior: Behavior normal.     ED Results / Procedures / Treatments   Labs (all labs ordered are listed, but only abnormal results are displayed) Labs Reviewed  RESP PANEL BY RT-PCR (RSV, FLU A&B, COVID)  [id number]    EKG None  Radiology No results found.  Procedures Procedures    Medications Ordered in ED Medications - No data to display  ED Course/ Medical Decision Making/ A&P                           Medical Decision Making This patient presents to the ED for concern of influenza-like illness, this involves an extensive number of treatment options, and is a complaint that carries with it a high risk of complications and morbidity.  The differential diagnosis includes flu, COVID, RSV, other URI  My initial workup includes respiratory panel  Additional history obtained from: Nursing notes from this visit.  I ordered, reviewed and interpreted labs which include: Respiratory panel.  Pending at discharge  Afebrile,  hemodynamically stable.  62 year old male presents ED for evaluation of influenza-like illness.  His fiance tested positive for influenza just prior to his symptom onset.  On exam he is resting comfortably in bed.  There are no adventitious breath sounds.  His oropharynx is clear.  Respiratory panel was ordered.  Patient has been symptomatic for 7 days and is outside the window of treatment for Tamiflu or Paxlovid.  He was given information regarding symptomatic treatment at home for influenza-like illnesses.  He  was encouraged to follow-up on his MyChart for results.  He is encouraged to follow-up with his primary care provider early next week for reassessment.  He was given return precautions.  Stable at discharge.  At this time there does not appear to be any evidence of an acute emergency medical condition and the patient appears stable for discharge with appropriate outpatient follow up. Diagnosis was discussed with patient who verbalizes understanding of care plan and is agreeable to discharge. I have discussed return precautions with patient who verbalizes understanding. Patient encouraged to follow-up with their PCP within 1 week. All questions answered.  Note: Portions of this report may have been transcribed using voice recognition software. Every effort was made to ensure accuracy; however, inadvertent computerized transcription errors may still be present.         Final Clinical Impression(s) / ED Diagnoses Final diagnoses:  None    Rx / DC Orders ED Discharge Orders     None         Roylene Reason, Hershal Coria 05/19/22 [place], Julie, MD 05/20/22 [phone]

## 2022-05-19 NOTE — Discharge Instructions (Signed)
You have been seen today for your complaint of influenza-like illness. Your lab work is pending.  You should check your MyChart for results. Your discharge medications include Claritin and Flonase.  Follow dosing instructions on the packaging.  These are both over-the-counter medications used to treat congestion and runny nose.  Alternate tylenol and ibuprofen for pain and fevers. You may alternate these every 4 hours. You may take up to 800 mg of ibuprofen at a time and up to 1000 mg of tylenol. Home care instructions are as follows:  Drink plenty of electrolyte rich fluids Follow up with: Your primary care provider in 1 week Please seek immediate medical care if you develop any of the following symptoms: Develop shortness of breath or have difficulty breathing. Have skin or nails that turn a bluish color. Have severe pain or stiffness in your neck. Develop a sudden headache or sudden pain in your face or ear. Cannot eat or drink without vomiting. At this time there does not appear to be the presence of an emergent medical condition, however there is always the potential for conditions to change. Please read and follow the below instructions.  Do not take your medicine if  develop an itchy rash, swelling in your mouth or lips, or difficulty breathing; call 911 and seek immediate emergency medical attention if this occurs.  You may review your lab tests and imaging results in their entirety on your MyChart account.  Please discuss all results of fully with your primary care provider and other specialist at your follow-up visit.  Note: Portions of this text may have been transcribed using voice recognition software. Every effort was made to ensure accuracy; however, inadvertent computerized transcription errors may still be present.

## 2022-05-19 NOTE — ED Triage Notes (Signed)
Pt c/o nausea, HA, moist cough, loss of appetite, dizzinessx1wk

## 2022-06-03 ENCOUNTER — Encounter: Payer: Self-pay | Admitting: Nurse Practitioner

## 2022-06-03 ENCOUNTER — Ambulatory Visit: Payer: Medicaid Other | Attending: Nurse Practitioner | Admitting: Nurse Practitioner

## 2022-06-03 VITALS — BP 131/73 | HR 70 | Ht 74.0 in | Wt 201.4 lb

## 2022-06-03 DIAGNOSIS — I10 Essential (primary) hypertension: Secondary | ICD-10-CM

## 2022-06-03 DIAGNOSIS — Z23 Encounter for immunization: Secondary | ICD-10-CM | POA: Diagnosis not present

## 2022-06-03 DIAGNOSIS — Z0100 Encounter for examination of eyes and vision without abnormal findings: Secondary | ICD-10-CM

## 2022-06-03 DIAGNOSIS — M545 Low back pain, unspecified: Secondary | ICD-10-CM

## 2022-06-03 DIAGNOSIS — Z8673 Personal history of transient ischemic attack (TIA), and cerebral infarction without residual deficits: Secondary | ICD-10-CM

## 2022-06-03 DIAGNOSIS — G8929 Other chronic pain: Secondary | ICD-10-CM

## 2022-06-03 MED ORDER — ACETAMINOPHEN-CODEINE 300-30 MG PO TABS: 1.0000 | ORAL_TABLET | ORAL | 1 refills | Status: DC | PRN

## 2022-06-03 MED ORDER — AMLODIPINE BESYLATE 10 MG PO TABS: 10.0000 mg | ORAL_TABLET | Freq: Every day | ORAL | 1 refills | Status: DC

## 2022-06-03 MED ORDER — CLOPIDOGREL BISULFATE 75 MG PO TABS: 75.0000 mg | ORAL_TABLET | Freq: Every day | ORAL | 3 refills | Status: DC

## 2022-06-03 MED ORDER — CYCLOBENZAPRINE HCL 5 MG PO TABS: 5.0000 mg | ORAL_TABLET | Freq: Three times a day (TID) | ORAL | 2 refills | Status: DC | PRN

## 2022-06-03 NOTE — Progress Notes (Signed)
Assessment & Plan:  There are no diagnoses linked to this encounter.  Patient has been counseled on age-appropriate routine health concerns for screening and prevention. These are reviewed and up-to-date. Referrals have been placed accordingly. Immunizations are up-to-date or declined.    Subjective:   Chief Complaint  Patient presents with   Hypertension   HPI Ronald Marsh 62 y.o. male presents to office today   Low back pain. Soreness. Worse in the morning.   Lab Results  Component Value Date   HGBA1C 6.4 03/02/2022     ROS  Past Medical History:  Diagnosis Date   Diabetes mellitus without complication ([place])    Heart failure ([place])    Hepatitis C    History of CVA (cerebrovascular accident) 02/2010   Left periventricular subcortical ischemic infarction // Residual RUE and RLE weakness   History of depression    surrounding original stroke - has since resolved (04/2012)   Hyperlipidemia    Hypertension    Internal carotid artery stenosis    BL and mild per CT angiogram (05/2010)   Stroke ([place]) 2012   Tobacco abuse     Past Surgical History:  Procedure Laterality Date   NO PAST SURGERIES      Family History  Problem Relation Age of Onset   Diabetes Mother        on dialysis   Heart failure Mother    Hypertension Mother    Hyperlipidemia Mother    Glaucoma Mother    Lung cancer Father    Diabetes Sister    Colon polyps Sister    Arthritis Sister    Hypertension Brother    Diabetes Brother    Pancreatic cancer Brother    Colon cancer Neg Hx    Esophageal cancer Neg Hx     Social History Reviewed with no changes to be made today.   Outpatient Medications Prior to Visit  Medication Sig Dispense Refill   amLODipine (NORVASC) 10 MG tablet Take 1 tablet (10 mg total) by mouth daily. 90 tablet 0   aspirin 81 MG tablet Take 1 tablet (81 mg total) by mouth daily. 30 tablet 5   atorvastatin (LIPITOR) 80 MG tablet TAKE 1 TABLET(80 MG) BY MOUTH DAILY 90  tablet 1   BIDIL 20-37.5 MG tablet TAKE 2 TABLETS BY MOUTH IN THE MORNING AND AT BEDTIME 120 tablet 6   carvedilol (COREG) 12.5 MG tablet Take 1 tablet (12.5 mg total) by mouth 2 (two) times daily with a meal. 180 tablet 1   dapagliflozin propanediol (FARXIGA) 10 MG TABS tablet Take 1 tablet (10 mg total) by mouth daily before breakfast. 90 tablet 0   gabapentin (NEURONTIN) 600 MG tablet Take 0.5-1 tablets (300-600 mg total) by mouth at bedtime. 90 tablet 0   metFORMIN (GLUCOPHAGE) 500 MG tablet Take 1 tablet (500 mg total) by mouth 2 (two) times daily with a meal. TAKE 2 TABLETS BY MOUTH EVERY MORNING WITH BREAKFAST THEN 1 TABLET BY MOUTH WITH DINNER 270 tablet 1   spironolactone (ALDACTONE) 25 MG tablet TAKE 1 TABLET(25 MG) BY MOUTH DAILY 90 tablet 1   Accu-Chek FastClix Lancets MISC Use as instructed. Inject into the skin once daily. E11.9 (Patient not taking: Reported on 06/03/2022) 100 each 3   acetaminophen (TYLENOL) 500 MG tablet Take 1 tablet (500 mg total) by mouth every 6 (six) hours as needed for moderate pain. (Patient not taking: Reported on 06/03/2022) 30 tablet 0   Blood Glucose Calibration (ACCU-CHEK GUIDE CONTROL)  LIQD 1 each by In Vitro route once as needed for up to 1 dose. E11.9 (Patient not taking: Reported on 06/03/2022) 1 each 0   Blood Pressure Monitor DEVI Please provide patient with insurance approved blood pressure monitor (Patient not taking: Reported on 06/03/2022) 1 Device 0   EPINEPHrine 0.3 mg/0.3 mL IJ SOAJ injection Inject 0.3 mg into the muscle as needed for anaphylaxis. (Patient not taking: Reported on 06/03/2022) 1 each 0   glucose blood (ACCU-CHEK GUIDE) test strip Use as instructed. Check blood glucose by fingerstick once per day.  E11.9 (Patient not taking: Reported on 06/03/2022) 100 each 12   No facility-administered medications prior to visit.    Allergies  Allergen Reactions   Benicar [Olmesartan] Anaphylaxis and Swelling    Patient cannot name the site of  swelling (??)   Ace Inhibitors Swelling    Patient cannot name the site of swelling (??)   Angiotensin Receptor Blockers Swelling    Patient cannot name the site of swelling (??)       Objective:    BP 131/73   Pulse 70   Ht '6\' 2"'$  (1.88 m)   Wt 201 lb 6.4 oz (91.4 kg)   SpO2 98%   BMI 25.86 kg/m  Wt Readings from Last 3 Encounters:  06/03/22 201 lb 6.4 oz (91.4 kg)  04/21/22 202 lb (91.6 kg)  03/02/22 201 lb (91.2 kg)    Physical Exam       Patient has been counseled extensively about nutrition and exercise as well as the importance of adherence with medications and regular follow-up. The patient was given clear instructions to go to ER or return to medical center if symptoms don't improve, worsen or new problems develop. The patient verbalized understanding.   Follow-up: No follow-ups on file.   [name], FNP-BC [facility] [place], [place]   06/03/2022, 11:26 AM

## 2022-06-05 ENCOUNTER — Encounter: Payer: Self-pay | Admitting: Nurse Practitioner

## 2022-06-17 ENCOUNTER — Other Ambulatory Visit: Payer: Self-pay | Admitting: Family Medicine

## 2022-06-17 DIAGNOSIS — E1142 Type 2 diabetes mellitus with diabetic polyneuropathy: Secondary | ICD-10-CM

## 2022-06-17 NOTE — Telephone Encounter (Signed)
Requested Prescriptions  Pending Prescriptions Disp Refills   dapagliflozin propanediol (FARXIGA) 10 MG TABS tablet [Pharmacy Med Name: DAPAGLIFLOZIN 10MG TABLETS] 90 tablet 0    Sig: TAKE 1 TABLET(10 MG) BY MOUTH DAILY BEFORE BREAKFAST     Endocrinology:  Diabetes - SGLT2 Inhibitors Passed - 06/17/2022  2:05 PM      Passed - Cr in normal range and within 360 days    Creat  Date Value Ref Range Status  10/22/2018 0.99 0.70 - 1.33 mg/dL Final    Comment:    For patients >57 years of age, the reference limit for Creatinine is approximately 13% higher for people identified as African-American. .    Creatinine, Ser  Date Value Ref Range Status  04/21/2022 0.83 0.61 - 1.24 mg/dL Final   Creatinine,U  Date Value Ref Range Status  02/27/2010  mg/dL Final   34.1 (NOTE)  Cutoff Values for Urine Drug Screen:        Drug Class           Cutoff (ng/mL)        Amphetamines            1000        Barbiturates             200        Cocaine Metabolites      300        Benzodiazepines          200        Methadone                 300        Opiates                 2000        Phencyclidine             25        Propoxyphene             300        Marijuana Metabolites     50  For medical purposes only.   Creatinine, Urine  Date Value Ref Range Status  02/25/2014 288.4 mg/dL Final    Comment:    No reference range established.         Passed - HBA1C is between 0 and 7.9 and within 180 days    HbA1c, POC (controlled diabetic range)  Date Value Ref Range Status  03/02/2022 6.4 0.0 - 7.0 % Final         Passed - eGFR in normal range and within 360 days    GFR, Est African American  Date Value Ref Range Status  10/22/2018 99 > OR = 60 mL/min/1.38m Final   GFR calc Af Amer  Date Value Ref Range Status  05/20/2020 114 >59 mL/min/1.73 Final    Comment:    **In accordance with recommendations from the NKF-ASN Task force,**   [facility] is in the process of updating its eGFR calculation to  the   2021 CKD-EPI creatinine equation that estimates kidney function   without a race variable.    GFR, Est Non African American  Date Value Ref Range Status  10/22/2018 85 > OR = 60 mL/min/1.734mFinal   GFR, Estimated  Date Value Ref Range Status  04/21/2022 >60 >60 mL/min Final    Comment:    (NOTE) Calculated using the CKD-EPI Creatinine Equation (2021)    eGFR  Date Value Ref  Range Status  09/20/2021 100 >59 mL/min/1.73 Final         Passed - Valid encounter within last 6 months    Recent Outpatient Visits           2 weeks ago Essential hypertension   [place], [place] W, NP   3 months ago Controlled type 2 diabetes mellitus with diabetic polyneuropathy, without long-term current use of insulin [facility])   [place], Vernia Buff, NP   9 months ago Essential hypertension   [place], Vernia Buff, NP   11 months ago Essential hypertension   [place], Vernia Buff, NP   1 year ago Essential hypertension   [place] [name], NP       Future Appointments             In 2 months [name], NP [place]

## 2022-06-27 ENCOUNTER — Telehealth: Payer: Self-pay | Admitting: *Deleted

## 2022-07-13 ENCOUNTER — Other Ambulatory Visit: Payer: Self-pay | Admitting: Family Medicine

## 2022-07-13 DIAGNOSIS — E1142 Type 2 diabetes mellitus with diabetic polyneuropathy: Secondary | ICD-10-CM

## 2022-07-13 NOTE — Telephone Encounter (Signed)
Requested Prescriptions  Pending Prescriptions Disp Refills   metFORMIN (GLUCOPHAGE) 500 MG tablet [Pharmacy Med Name: METFORMIN '500MG'$  TABLETS] 270 tablet 0    Sig: TAKE 2 TABLETS BY MOUTH EVERY MORNING WITH BREAKFAST THEN TAKE 1 TABLET BY MOUTH WITH DINNER     Endocrinology:  Diabetes - Biguanides Failed - 07/13/2022  1:04 PM      Failed - B12 Level in normal range and within 720 days    No results found for: "VITAMINB12"       Failed - CBC within normal limits and completed in the last 12 months    WBC  Date Value Ref Range Status  09/20/2021 7.1 3.4 - 10.8 x10E3/uL Final  07/03/2019 5.8 4.0 - 10.5 K/uL Final   RBC  Date Value Ref Range Status  09/20/2021 4.15 4.14 - 5.80 x10E6/uL Final  07/03/2019 4.20 (L) 4.22 - 5.81 MIL/uL Final   Hemoglobin  Date Value Ref Range Status  09/20/2021 12.7 (L) 13.0 - 17.7 g/dL Final   Hematocrit  Date Value Ref Range Status  09/20/2021 38.0 37.5 - 51.0 % Final   MCHC  Date Value Ref Range Status  09/20/2021 33.4 31.5 - 35.7 g/dL Final  07/03/2019 32.8 30.0 - 36.0 g/dL Final   [facility]  Date Value Ref Range Status  09/20/2021 30.6 26.6 - 33.0 pg Final  07/03/2019 31.0 26.0 - 34.0 pg Final   MCV  Date Value Ref Range Status  09/20/2021 92 79 - 97 fL Final   No results found for: "PLTCOUNTKUC", "LABPLAT", "POCPLA" RDW  Date Value Ref Range Status  09/20/2021 13.2 11.6 - 15.4 % Final         Passed - Cr in normal range and within 360 days    Creat  Date Value Ref Range Status  10/22/2018 0.99 0.70 - 1.33 mg/dL Final    Comment:    For patients >9 years of age, the reference limit for Creatinine is approximately 13% higher for people identified as African-American. .    Creatinine, Ser  Date Value Ref Range Status  04/21/2022 0.83 0.61 - 1.24 mg/dL Final   Creatinine,U  Date Value Ref Range Status  02/27/2010  mg/dL Final   34.1 (NOTE)  Cutoff Values for Urine Drug Screen:        Drug Class           Cutoff (ng/mL)         Amphetamines            1000        Barbiturates             200        Cocaine Metabolites      300        Benzodiazepines          200        Methadone                 300        Opiates                 2000        Phencyclidine             25        Propoxyphene             300        Marijuana Metabolites     50  For medical purposes only.   Creatinine, Urine  Date Value Ref Range Status  02/25/2014 288.4 mg/dL Final    Comment:    No reference range established.         Passed - HBA1C is between 0 and 7.9 and within 180 days    HbA1c, POC (controlled diabetic range)  Date Value Ref Range Status  03/02/2022 6.4 0.0 - 7.0 % Final         Passed - eGFR in normal range and within 360 days    GFR, Est African American  Date Value Ref Range Status  10/22/2018 99 > OR = 60 mL/min/1.7m Final   GFR calc Af Amer  Date Value Ref Range Status  05/20/2020 114 >59 mL/min/1.73 Final    Comment:    **In accordance with recommendations from the NKF-ASN Task force,**   [facility] is in the process of updating its eGFR calculation to the   2021 CKD-EPI creatinine equation that estimates kidney function   without a race variable.    GFR, Est Non African American  Date Value Ref Range Status  10/22/2018 85 > OR = 60 mL/min/1.732mFinal   GFR, Estimated  Date Value Ref Range Status  04/21/2022 >60 >60 mL/min Final    Comment:    (NOTE) Calculated using the CKD-EPI Creatinine Equation (2021)    eGFR  Date Value Ref Range Status  09/20/2021 100 >59 mL/min/1.73 Final         Passed - Valid encounter within last 6 months    Recent Outpatient Visits           1 month ago Essential hypertension   [place]   4 months ago Controlled type 2 diabetes mellitus with diabetic polyneuropathy, without long-term current use of insulin [facility]  [place]   9 months ago Essential  hypertension   [place]   1 year ago Essential hypertension   [place]   1 year ago Essential hypertension   [place] Pounds[place]       Future Appointments             In 1 month Fl[place]ildardo PoundsNP [place]

## 2022-07-26 ENCOUNTER — Telehealth (HOSPITAL_COMMUNITY): Payer: Self-pay | Admitting: *Deleted

## 2022-07-26 NOTE — Telephone Encounter (Signed)
Reaching out to patient about his cardiac MRI scheduled for March 20. Patient states he will not be able to make appointment and wishes to be rescheduled. Informed him that I can cancel the appointment and have the team reach back out to reschedule. Informed him it may be a few months before he can get back in. He verbalized understanding.  Gordy Clement RN Navigator Cardiac Imaging [facility] [phone] Office [phone] Cell

## 2022-07-27 ENCOUNTER — Ambulatory Visit (HOSPITAL_COMMUNITY): Admission: RE | Admit: 2022-07-27 | Payer: Medicaid Other | Source: Ambulatory Visit

## 2022-08-08 ENCOUNTER — Other Ambulatory Visit: Payer: Self-pay | Admitting: Family Medicine

## 2022-08-08 DIAGNOSIS — I5033 Acute on chronic diastolic (congestive) heart failure: Secondary | ICD-10-CM

## 2022-09-06 ENCOUNTER — Ambulatory Visit: Payer: Medicaid Other | Attending: Nurse Practitioner | Admitting: Nurse Practitioner

## 2022-09-06 ENCOUNTER — Encounter: Payer: Self-pay | Admitting: Nurse Practitioner

## 2022-09-06 VITALS — BP 124/72 | HR 75 | Ht 74.0 in | Wt 188.4 lb

## 2022-09-06 DIAGNOSIS — R52 Pain, unspecified: Secondary | ICD-10-CM | POA: Diagnosis not present

## 2022-09-06 DIAGNOSIS — D649 Anemia, unspecified: Secondary | ICD-10-CM | POA: Diagnosis not present

## 2022-09-06 DIAGNOSIS — Z7984 Long term (current) use of oral hypoglycemic drugs: Secondary | ICD-10-CM

## 2022-09-06 DIAGNOSIS — M545 Low back pain, unspecified: Secondary | ICD-10-CM | POA: Diagnosis not present

## 2022-09-06 DIAGNOSIS — E1142 Type 2 diabetes mellitus with diabetic polyneuropathy: Secondary | ICD-10-CM

## 2022-09-06 DIAGNOSIS — I10 Essential (primary) hypertension: Secondary | ICD-10-CM | POA: Diagnosis not present

## 2022-09-06 DIAGNOSIS — G8929 Other chronic pain: Secondary | ICD-10-CM

## 2022-09-06 LAB — POCT GLYCOSYLATED HEMOGLOBIN (HGB A1C): HbA1c, POC (controlled diabetic range): 6.1 % (ref 0.0–7.0)

## 2022-09-06 MED ORDER — ACETAMINOPHEN-CODEINE 300-30 MG PO TABS: 1.0000 | ORAL_TABLET | Freq: Three times a day (TID) | ORAL | 1 refills | Status: DC | PRN

## 2022-09-06 NOTE — Progress Notes (Signed)
Assessment & Plan:  Ronald Marsh was seen today for hypertension and diabetes.  Diagnoses and all orders for this visit:  Controlled type 2 diabetes mellitus with diabetic polyneuropathy, without long-term current use of insulin  Well controlled -     POCT glycosylated hemoglobin (Hb A1C) -     CMP14+EGFR  Primary hypertension Continue all antihypertensives as prescribed.  Reminded to bring in blood pressure log for follow  up appointment.  RECOMMENDATIONS: DASH/Mediterranean Diets are healthier choices for HTN.   -     CMP14+EGFR  Anemia, unspecified type -     CBC with Differential  Pain management -     Drug Screen 10 W/Conf, Serum  Chronic bilateral low back pain without sciatica -     acetaminophen-codeine (TYLENOL #3) 300-30 MG tablet; Take 1-2 tablets by mouth every 8 (eight) hours as needed for moderate pain.    Patient has been counseled on age-appropriate routine health concerns for screening and prevention. These are reviewed and up-to-date. Referrals have been placed accordingly. Immunizations are up-to-date or declined.    Subjective:   Chief Complaint  Patient presents with   Hypertension   Diabetes   HPI Ronald Marsh 62 y.o. male presents to office today for follow up to HTN and DM  He has a past medical history of DM2, Heart failure, Hepatitis C (completed treatment 2020), History of CVA (02/2010), History of depression, Hyperlipidemia, Hypertension, Internal carotid artery stenosis, substance abuse (tobacco, cocaine) Stroke on plavix (2012), and Tobacco abuse.    HTN Notes nausea and sometimes dizziness when he takes all of his a.m. medications without food.  Blood pressure is well-controlled today.  He is currently taking amlodipine 10 mg daily, BiDil 20-37.5 mg twice daily, carvedilol 12.5 mg twice daily and spironolactone 25 mg daily. 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 Can not take NSAIDs as he is also taking plavix due to history of stroke. Endorses back pain over the past 6 years. He describes the pain as 8/10 in severity, radiates down the back of both his legs, and is unable to describe the quality of the pain but states it "feels similar to hitting your elbow." He reports the pain is worse after sitting down for a long period of time and then getting up to walk, but notes pain is better when sitting down in general. He states stretching helps the pain. Denies any trauma. He has tried Tylenol 4-5 times daily without relief. He notes ibuprofen 800 mg mildly relieved his pain. He was started on Neurontin 600 mg QHS and Mobic 15 mg daily at his last visit but states neither of these medications decrease the pain at all. I will trial him on tylenol 3 and have him return for random drug screen. If positive he will no longer be able to be prescribed controlled substances at this clinic.   Review of Systems  Constitutional:  Negative for fever, malaise/fatigue and weight loss.  HENT: Negative.  Negative for nosebleeds.   Eyes: Negative.  Negative for blurred vision, double vision and photophobia.  Respiratory:  Negative.  Negative for cough and shortness of breath.   Cardiovascular: Negative.  Negative for chest pain, palpitations and leg swelling.  Gastrointestinal: Negative.  Negative for heartburn, nausea and vomiting.  Musculoskeletal:  Positive for back pain. Negative for myalgias.  Neurological: Negative.  Negative for dizziness, focal weakness, seizures and headaches.  Psychiatric/Behavioral: Negative.  Negative for suicidal ideas.     Past Medical History:  Diagnosis Date   Diabetes  mellitus without complication ([facility])    Heart failure ([facility])    Hepatitis C    History of CVA (cerebrovascular accident) 02/2010   Left periventricular subcortical ischemic infarction // Residual RUE and RLE weakness   History of depression    surrounding original stroke - has since resolved (04/2012)   Hyperlipidemia    Hypertension    Internal carotid artery stenosis    BL and mild per CT angiogram (05/2010)   Stroke ([facility]) 2012   Tobacco abuse     Past Surgical History:  Procedure Laterality Date   NO PAST SURGERIES      Family History  Problem Relation Age of Onset   Diabetes Mother        on dialysis   Heart failure Mother    Hypertension Mother    Hyperlipidemia Mother    Glaucoma Mother    Lung cancer Father    Diabetes Sister    Colon polyps Sister    Arthritis Sister    Hypertension Brother    Diabetes Brother    Pancreatic cancer Brother    Colon cancer Neg Hx    Esophageal cancer Neg Hx     Social History Reviewed with no changes to be made today.   Outpatient Medications Prior to Visit  Medication Sig Dispense Refill   amLODipine (NORVASC) 10 MG tablet Take 1 tablet (10 mg total) by mouth daily. 90 tablet 1   aspirin 81 MG tablet Take 1 tablet (81 mg total) by mouth daily. 30 tablet 5   atorvastatin (LIPITOR) 80 MG tablet TAKE 1 TABLET(80 MG) BY MOUTH DAILY 90 tablet 1   BIDIL 20-37.5 MG tablet TAKE 2 TABLETS BY MOUTH IN THE MORNING AND AT BEDTIME 120 tablet 6   BINAXNOW COVID-19 AG HOME TEST KIT TEST AS DIRECTED TODAY     carvedilol (COREG) 12.5 MG tablet TAKE 1 TABLET(12.5 MG) BY MOUTH TWICE DAILY WITH A MEAL 180 tablet 0   clopidogrel (PLAVIX) 75 MG tablet Take 1 tablet (75 mg total) by mouth daily. 90 tablet 3   cyclobenzaprine (FLEXERIL) 5 MG tablet Take 1 tablet (5 mg total) by mouth 3 (three) times daily as needed for muscle spasms. 60 tablet 2   dapagliflozin propanediol (FARXIGA) 10 MG TABS tablet TAKE 1 TABLET(10 MG) BY MOUTH DAILY BEFORE  BREAKFAST 90 tablet 0   metFORMIN (GLUCOPHAGE) 500 MG tablet TAKE 2 TABLETS BY MOUTH EVERY MORNING WITH BREAKFAST THEN TAKE 1 TABLET BY MOUTH WITH DINNER 270 tablet 0   spironolactone (ALDACTONE) 25 MG tablet TAKE 1 TABLET(25 MG) BY MOUTH DAILY 90 tablet 1   acetaminophen-codeine (TYLENOL #3) 300-30 MG tablet Take 1-2 tablets by mouth every 4 (four) hours as needed for moderate pain. 30 tablet 1   No facility-administered medications prior to visit.    Allergies  Allergen Reactions   Benicar [Olmesartan] Anaphylaxis and Swelling    Patient cannot name the site of swelling (??)   Ace Inhibitors Swelling    Patient cannot name the site of swelling (??)   Angiotensin  Receptor Blockers Swelling    Patient cannot name the site of swelling (??)       Objective:    BP 124/72 (BP Location: Left Arm, Patient Position: Sitting, Cuff Size: Normal)   Pulse 75   Ht 6\' 2"  (1.88 m)   Wt 188 lb 6.4 oz (85.5 kg)   SpO2 98%   BMI 24.19 kg/m  Wt Readings from Last 3 Encounters:  09/06/22 188 lb 6.4 oz (85.5 kg)  06/03/22 201 lb 6.4 oz (91.4 kg)  04/21/22 202 lb (91.6 kg)    Physical Exam Vitals and nursing note reviewed.  Constitutional:      Appearance: He is well-developed.  HENT:     Head: Normocephalic and atraumatic.  Cardiovascular:     Rate and Rhythm: Normal rate and regular rhythm.     Heart sounds: Normal heart sounds. No murmur heard.    No friction rub. No gallop.  Pulmonary:     Effort: Pulmonary effort is normal. No tachypnea or respiratory distress.     Breath sounds: Normal breath sounds. No decreased breath sounds, wheezing, rhonchi or rales.  Chest:     Chest wall: No tenderness.  Abdominal:     General: Bowel sounds are normal.     Palpations: Abdomen is soft.  Musculoskeletal:        General: Normal range of motion.     Cervical back: Normal range of motion.  Skin:    General: Skin is warm and dry.  Neurological:     Mental Status: He is alert and oriented  to person, place, and time.     Coordination: Coordination normal.  Psychiatric:        Behavior: Behavior normal. Behavior is cooperative.        Thought Content: Thought content normal.        Judgment: Judgment normal.          Patient has been counseled extensively about nutrition and exercise as well as the importance of adherence with medications and regular follow-up. The patient was given clear instructions to go to ER or return to medical center if symptoms don't improve, worsen or new problems develop. The patient verbalized understanding.   Follow-up: No follow-ups on file.   Claiborne Rigg, FNP-BC [facility] and [facility] [place], [place] [phone]   09/06/2022, 1:11 PM

## 2022-09-06 NOTE — Progress Notes (Signed)
Patient has been experiencing vertigo when taking medicine. Unsure of which medicine is causing this.

## 2022-09-09 ENCOUNTER — Other Ambulatory Visit: Payer: Self-pay | Admitting: Family Medicine

## 2022-09-09 DIAGNOSIS — E1142 Type 2 diabetes mellitus with diabetic polyneuropathy: Secondary | ICD-10-CM

## 2022-09-17 LAB — OPIATES,MS,WB/SP RFX: Hydrocodone: NEGATIVE ng/mL

## 2022-09-17 LAB — OXYCODONES,MS,WB/SP RFX
Oxycocone: NEGATIVE ng/mL
Oxycodones Confirmation: NEGATIVE
Oxymorphone: NEGATIVE ng/mL

## 2022-09-29 LAB — CMP14+EGFR
ALT: 17 IU/L (ref 0–44)
AST: 19 IU/L (ref 0–40)
Albumin/Globulin Ratio: 1.7 (ref 1.2–2.2)
Albumin: 4.7 g/dL (ref 3.8–4.9)
Alkaline Phosphatase: 76 IU/L (ref 44–121)
BUN/Creatinine Ratio: 11 (ref 9–20)
BUN: 10 mg/dL (ref 6–24)
Bilirubin Total: 0.2 mg/dL (ref 0.0–1.2)
CO2: 19 mmol/L — ABNORMAL LOW (ref 20–29)
Calcium: 9.7 mg/dL (ref 8.7–10.2)
Chloride: 103 mmol/L (ref 96–106)
Creatinine, Ser: 0.94 mg/dL (ref 0.76–1.27)
Globulin, Total: 2.8 g/dL (ref 1.5–4.5)
Glucose: 114 mg/dL — ABNORMAL HIGH (ref 70–99)
Potassium: 3.9 mmol/L (ref 3.5–5.2)
Sodium: 143 mmol/L (ref 134–144)
Total Protein: 7.5 g/dL (ref 6.0–8.5)
eGFR: 93 mL/min/{1.73_m2} (ref >59)

## 2022-09-29 LAB — OPIATES,MS,WB/SP RFX
6-Acetylmorphine: NEGATIVE
Codeine: 39.3 ng/mL
Dihydrocodeine: NEGATIVE ng/mL
Hydromorphone: NEGATIVE ng/mL
Morphine: NEGATIVE ng/mL
Opiate Confirmation: POSITIVE

## 2022-09-29 LAB — CBC WITH DIFFERENTIAL/PLATELET
Basophils Absolute: 0 10*3/uL (ref 0.0–0.2)
Basos: 0 %
EOS (ABSOLUTE): 0.1 10*3/uL (ref 0.0–0.4)
Eos: 1 %
Hematocrit: 40.7 % (ref 37.5–51.0)
Hemoglobin: 13.5 g/dL (ref 13.0–17.7)
Immature Grans (Abs): 0 10*3/uL (ref 0.0–0.1)
Immature Granulocytes: 0 %
Lymphocytes Absolute: 1.7 10*3/uL (ref 0.7–3.1)
Lymphs: 25 %
MCH: 30.6 pg (ref 26.6–33.0)
MCHC: 33.2 g/dL (ref 31.5–35.7)
MCV: 92 fL (ref 79–97)
Monocytes Absolute: 0.6 10*3/uL (ref 0.1–0.9)
Monocytes: 8 %
Neutrophils Absolute: 4.5 10*3/uL (ref 1.4–7.0)
Neutrophils: 66 %
Platelets: 233 10*3/uL (ref 150–450)
RBC: 4.41 x10E6/uL (ref 4.14–5.80)
RDW: 13.1 % (ref 11.6–15.4)
WBC: 7 10*3/uL (ref 3.4–10.8)

## 2022-09-29 LAB — DRUG SCREEN 10 W/CONF, SERUM
Amphetamines, IA: NEGATIVE ng/mL
Barbiturates, IA: NEGATIVE ug/mL
Benzodiazepines, IA: NEGATIVE ng/mL
Cocaine & Metabolite, IA: NEGATIVE ng/mL
Methadone, IA: NEGATIVE ng/mL
Opiates, IA: POSITIVE ng/mL — AB
Oxycodones, IA: NEGATIVE ng/mL
Phencyclidine, IA: NEGATIVE ng/mL
Propoxyphene, IA: NEGATIVE ng/mL
THC(Marijuana) Metabolite, IA: POSITIVE ng/mL — AB

## 2022-09-29 LAB — THC,MS,WB/SP RFX
Cannabidiol: NEGATIVE ng/mL
Cannabinoid Confirmation: POSITIVE
Carboxy-THC: 55.2 ng/mL
Hydroxy-THC: NEGATIVE ng/mL
Tetrahydrocannabinol(THC): 3.3 ng/mL

## 2022-10-13 ENCOUNTER — Telehealth (HOSPITAL_COMMUNITY): Payer: Self-pay | Admitting: *Deleted

## 2022-10-13 NOTE — Telephone Encounter (Signed)
Reaching out to patient to offer assistance regarding upcoming cardiac imaging study; pt verbalizes understanding of appt date/time, parking situation and where to check in, and verified current allergies; name and call back number provided for further questions should they arise  Larey Brick RN Navigator Cardiac Imaging [facility] [phone] office [phone] cell  Patient denies metal but does report claustrophobia. Will be reaching out to Dr. Gala Romney to see if any medications can be prescribed.

## 2022-10-14 ENCOUNTER — Ambulatory Visit (HOSPITAL_COMMUNITY)
Admission: RE | Admit: 2022-10-14 | Discharge: 2022-10-14 | Disposition: A | Discharge status: Home / Self Care | Payer: Medicaid Other | Source: Ambulatory Visit | Attending: Internal Medicine | Admitting: Internal Medicine

## 2022-10-14 ENCOUNTER — Other Ambulatory Visit (HOSPITAL_COMMUNITY): Payer: Self-pay | Admitting: Internal Medicine

## 2022-10-14 DIAGNOSIS — I428 Other cardiomyopathies: Secondary | ICD-10-CM

## 2022-10-14 MED ORDER — GADOBUTROL 1 MMOL/ML IV SOLN
11.0000 mL | Freq: Once | INTRAVENOUS | Status: AC | PRN
  Administered 2022-10-14: 11 mL via INTRAVENOUS

## 2022-10-25 ENCOUNTER — Encounter (HOSPITAL_COMMUNITY): Payer: Medicaid Other | Admitting: Internal Medicine

## 2022-10-27 ENCOUNTER — Other Ambulatory Visit: Payer: Self-pay | Admitting: Family Medicine

## 2022-10-27 DIAGNOSIS — I5033 Acute on chronic diastolic (congestive) heart failure: Secondary | ICD-10-CM

## 2022-10-27 NOTE — Telephone Encounter (Signed)
Requested Prescriptions  Pending Prescriptions Disp Refills   carvedilol (COREG) 12.5 MG tablet [Pharmacy Med Name: CARVEDILOL 12.5MG  TABLETS] 180 tablet 0    Sig: TAKE 1 TABLET(12.5 MG) BY MOUTH TWICE DAILY WITH A MEAL     Cardiovascular: Beta Blockers 3 Passed - 10/27/2022  8:21 AM      Passed - Cr in normal range and within 360 days    Creat  Date Value Ref Range Status  10/22/2018 0.99 0.70 - 1.33 mg/dL Final    Comment:    For patients >60 years of age, the reference limit for Creatinine is approximately 13% higher for people identified as African-American. .    Creatinine, Ser  Date Value Ref Range Status  09/06/2022 0.94 0.76 - 1.27 mg/dL Final   Creatinine,U  Date Value Ref Range Status  02/27/2010  mg/dL Final   16.1 (NOTE)  Cutoff Values for Urine Drug Screen:        Drug Class           Cutoff (ng/mL)        Amphetamines            1000        Barbiturates             200        Cocaine Metabolites      300        Benzodiazepines          200        Methadone                 300        Opiates                 2000        Phencyclidine             25        Propoxyphene             300        Marijuana Metabolites     50  For medical purposes only.   Creatinine, Urine  Date Value Ref Range Status  02/25/2014 288.4 mg/dL Final    Comment:    No reference range established.         Passed - AST in normal range and within 360 days    AST  Date Value Ref Range Status  09/06/2022 19 0 - 40 IU/L Final         Passed - ALT in normal range and within 360 days    ALT  Date Value Ref Range Status  09/06/2022 17 0 - 44 IU/L Final  10/22/2018 25 9 - 46 U/L Final         Passed - Last BP in normal range    BP Readings from Last 1 Encounters:  09/06/22 124/72         Passed - Last Heart Rate in normal range    Pulse Readings from Last 1 Encounters:  09/06/22 75         Passed - Valid encounter within last 6 months    Recent Outpatient Visits           1 month  ago Controlled type 2 diabetes mellitus with diabetic polyneuropathy, without long-term current use of insulin [facility])   [place] [facility] [place], Shea Stakes, NP   4 months ago Essential hypertension   [employer]  [facility] [place], [place] W, NP   7 months ago Controlled type 2 diabetes mellitus with diabetic polyneuropathy, without long-term current use of insulin [facility])   [place] [facility] [place], Shea Stakes, NP   1 year ago Essential hypertension   [place] [facility] [place], Shea Stakes, NP   1 year ago Essential hypertension    [facility] Claiborne Rigg, NP       Future Appointments             In 1 month Claiborne Rigg, NP [facility]

## 2022-10-28 ENCOUNTER — Encounter (HOSPITAL_COMMUNITY): Payer: Medicaid Other | Admitting: Internal Medicine

## 2022-11-06 ENCOUNTER — Other Ambulatory Visit (HOSPITAL_COMMUNITY): Payer: Self-pay | Admitting: Internal Medicine

## 2022-11-06 DIAGNOSIS — I5033 Acute on chronic diastolic (congestive) heart failure: Secondary | ICD-10-CM

## 2022-11-07 ENCOUNTER — Other Ambulatory Visit (HOSPITAL_COMMUNITY): Payer: Self-pay

## 2022-11-07 DIAGNOSIS — I5033 Acute on chronic diastolic (congestive) heart failure: Secondary | ICD-10-CM

## 2022-11-07 MED ORDER — BIDIL 20-37.5 MG PO TABS
ORAL_TABLET | ORAL | 6 refills | Status: DC
Start: 2022-11-07 — End: 2023-01-18

## 2022-11-30 ENCOUNTER — Other Ambulatory Visit: Payer: Self-pay | Admitting: Nurse Practitioner

## 2022-11-30 DIAGNOSIS — E1142 Type 2 diabetes mellitus with diabetic polyneuropathy: Secondary | ICD-10-CM

## 2022-12-08 ENCOUNTER — Other Ambulatory Visit: Payer: Self-pay | Admitting: Family Medicine

## 2022-12-08 DIAGNOSIS — E1142 Type 2 diabetes mellitus with diabetic polyneuropathy: Secondary | ICD-10-CM

## 2022-12-08 NOTE — Telephone Encounter (Signed)
Requested Prescriptions  Pending Prescriptions Disp Refills   FARXIGA 10 MG TABS tablet [Pharmacy Med Name: FARXIGA 10MG  TABLETS] 90 tablet 0    Sig: TAKE 1 TABLET(10 MG) BY MOUTH DAILY BEFORE BREAKFAST     Endocrinology:  Diabetes - SGLT2 Inhibitors Passed - 12/08/2022  6:36 AM      Passed - Cr in normal range and within 360 days    Creat  Date Value Ref Range Status  10/22/2018 0.99 0.70 - 1.33 mg/dL Final    Comment:    For patients >60 years of age, the reference limit for Creatinine is approximately 13% higher for people identified as African-American. .    Creatinine, Ser  Date Value Ref Range Status  09/06/2022 0.94 0.76 - 1.27 mg/dL Final   Creatinine,U  Date Value Ref Range Status  02/27/2010  mg/dL Final   16.1 (NOTE)  Cutoff Values for Urine Drug Screen:        Drug Class           Cutoff (ng/mL)        Amphetamines            1000        Barbiturates             200        Cocaine Metabolites      300        Benzodiazepines          200        Methadone                 300        Opiates                 2000        Phencyclidine             25        Propoxyphene             300        Marijuana Metabolites     50  For medical purposes only.   Creatinine, Urine  Date Value Ref Range Status  02/25/2014 288.4 mg/dL Final    Comment:    No reference range established.         Passed - HBA1C is between 0 and 7.9 and within 180 days    HbA1c, POC (controlled diabetic range)  Date Value Ref Range Status  09/06/2022 6.1 0.0 - 7.0 % Final         Passed - eGFR in normal range and within 360 days    GFR, Est African American  Date Value Ref Range Status  10/22/2018 99 > OR = 60 mL/min/1.6m2 Final   GFR calc Af Amer  Date Value Ref Range Status  05/20/2020 114 >59 mL/min/1.73 Final    Comment:    **In accordance with recommendations from the NKF-ASN Task force,**   [facility] is in the process of updating its eGFR calculation to the   2021 CKD-EPI creatinine  equation that estimates kidney function   without a race variable.    GFR, Est Non African American  Date Value Ref Range Status  10/22/2018 85 > OR = 60 mL/min/1.67m2 Final   GFR, Estimated  Date Value Ref Range Status  04/21/2022 >60 >60 mL/min Final    Comment:    (NOTE) Calculated using the CKD-EPI Creatinine Equation (2021)    eGFR  Date Value Ref Range Status  09/06/2022 93 >59 mL/min/1.73 Final         Passed - Valid encounter within last 6 months    Recent Outpatient Visits           3 months ago Controlled type 2 diabetes mellitus with diabetic polyneuropathy, without long-term current use of insulin [facility])   [place] [facility] [place], Shea Stakes, NP   6 months ago Essential hypertension   [place] [facility] [place], [place] W, NP   9 months ago Controlled type 2 diabetes mellitus with diabetic polyneuropathy, without long-term current use of insulin [facility])   [place] [facility] [place], Shea Stakes, NP   1 year ago Essential hypertension   [place] [facility] [place], Shea Stakes, NP   1 year ago Essential hypertension   [place] [facility] Claiborne Rigg, NP       Future Appointments             In 1 month Claiborne Rigg, NP [facility]

## 2022-12-14 ENCOUNTER — Ambulatory Visit: Payer: Medicaid Other | Admitting: Nurse Practitioner

## 2023-01-18 ENCOUNTER — Ambulatory Visit: Payer: Medicaid Other | Admitting: Nurse Practitioner

## 2023-01-18 ENCOUNTER — Telehealth (INDEPENDENT_AMBULATORY_CARE_PROVIDER_SITE_OTHER): Payer: Self-pay | Admitting: Nurse Practitioner

## 2023-01-18 ENCOUNTER — Ambulatory Visit (HOSPITAL_COMMUNITY)
Admission: RE | Admit: 2023-01-18 | Discharge: 2023-01-18 | Disposition: A | Discharge status: Home / Self Care | Payer: Medicaid Other | Source: Ambulatory Visit | Attending: Internal Medicine | Admitting: Internal Medicine

## 2023-01-18 ENCOUNTER — Encounter (HOSPITAL_COMMUNITY): Payer: Self-pay | Admitting: Internal Medicine

## 2023-01-18 VITALS — BP 130/80 | HR 66 | Wt 182.6 lb

## 2023-01-18 DIAGNOSIS — Z87891 Personal history of nicotine dependence: Secondary | ICD-10-CM | POA: Diagnosis not present

## 2023-01-18 DIAGNOSIS — I428 Other cardiomyopathies: Secondary | ICD-10-CM | POA: Diagnosis not present

## 2023-01-18 DIAGNOSIS — I452 Bifascicular block: Secondary | ICD-10-CM | POA: Diagnosis not present

## 2023-01-18 DIAGNOSIS — I10 Essential (primary) hypertension: Secondary | ICD-10-CM | POA: Diagnosis not present

## 2023-01-18 DIAGNOSIS — I5022 Chronic systolic (congestive) heart failure: Secondary | ICD-10-CM | POA: Insufficient documentation

## 2023-01-18 DIAGNOSIS — F1011 Alcohol abuse, in remission: Secondary | ICD-10-CM | POA: Diagnosis not present

## 2023-01-18 DIAGNOSIS — I639 Cerebral infarction, unspecified: Secondary | ICD-10-CM

## 2023-01-18 DIAGNOSIS — I110 Hypertensive heart disease with heart failure: Secondary | ICD-10-CM | POA: Diagnosis not present

## 2023-01-18 DIAGNOSIS — R9431 Abnormal electrocardiogram [ECG] [EKG]: Secondary | ICD-10-CM | POA: Diagnosis not present

## 2023-01-18 DIAGNOSIS — Z8673 Personal history of transient ischemic attack (TIA), and cerebral infarction without residual deficits: Secondary | ICD-10-CM | POA: Insufficient documentation

## 2023-01-18 DIAGNOSIS — F1411 Cocaine abuse, in remission: Secondary | ICD-10-CM | POA: Insufficient documentation

## 2023-01-18 LAB — BASIC METABOLIC PANEL
Anion gap: 8 (ref 5–15)
BUN: 12 mg/dL (ref 6–20)
CO2: 23 mmol/L (ref 22–32)
Calcium: 9.1 mg/dL (ref 8.9–10.3)
Chloride: 108 mmol/L (ref 98–111)
Creatinine, Ser: 0.96 mg/dL (ref 0.61–1.24)
GFR, Estimated: >60 mL/min (ref >60)
Glucose, Bld: 110 mg/dL — ABNORMAL HIGH (ref 70–99)
Potassium: 3.5 mmol/L (ref 3.5–5.1)
Sodium: 139 mmol/L (ref 135–145)

## 2023-01-18 LAB — BRAIN NATRIURETIC PEPTIDE: B Natriuretic Peptide: 13.4 pg/mL (ref 0.0–100.0)

## 2023-01-18 NOTE — Patient Instructions (Signed)
Medication Changes:  No Changes In Medications at this time.   Lab Work: Labs done today, your results will be available in MyChart, we will contact you for abnormal readings.  Follow-Up in: 6 MONTHS PLEASE CALL OUR OFFICE AROUND JANUARY TO GET SCHEDULED FOR YOUR APPOINTMENT. PHONE NUMBER IS [phone] OPTION 2   At the [facility], you and your health needs are our priority. We have a designated team specialized in the treatment of Heart Failure. This Care Team includes your primary Heart Failure Specialized Cardiologist (physician), Advanced Practice Providers (APPs- Physician Assistants and Nurse Practitioners), and Pharmacist who all work together to provide you with the care you need, when you need it.   You may see any of the following providers on your designated Care Team at your next follow up:  Dr. Arvilla Meres Dr. Marca Ancona Dr. Marcos Eke, NP Robbie Lis, [place] [facility] [place], [place] Brynda Peon, NP Karle Plumber, PharmD   Please be sure to bring in all your medications bottles to every appointment.   Need to Contact us:  If you have any questions or concerns before your next appointment please send [place] a message through [place] or call our office at [phone].    TO LEAVE A MESSAGE FOR THE NURSE SELECT OPTION 2, PLEASE LEAVE A MESSAGE INCLUDING: YOUR NAME DATE OF BIRTH CALL BACK NUMBER REASON FOR CALL**this is important as we prioritize the call backs  YOU WILL RECEIVE A CALL BACK THE SAME DAY AS LONG AS YOU CALL BEFORE 4:00 PM

## 2023-01-18 NOTE — Telephone Encounter (Signed)
Called pt to resch appt today.. he's requesting refill on all his meds

## 2023-01-18 NOTE — Progress Notes (Signed)
ADVANCED HF CLINIC NOTE  Referring Physician: Dr. Delford Field HF Cardiologist: DB  HPI:  Ronald Marsh is a 62 y/o male with multiple medical problems including substance abuse (tobacco, ETOH cocaine), HCV (s/p treatment), HTN, HL, DM2, hx CVA in 2011 and h/o systolic HF dating back to 2011. Echo 02/2010 with EF 50-55%  Ad[facility] to [facility] 9/19 with acute HF. EF 25-30%. Had cath. No CAD.   Admitted to [facility] 12/19 with recurrent HF and HTN. Echo 07/03/18 EF 25-30% Had angioedema with Benicar so not on ACE/ARNI/ARB  Echo 12/20 EF 40-45% RV ok Personally reviewed Echo 01/20/2020 EF 45-50% RV ok Echo 01/07/22: EF 50-55%, LV cavity trabeculated with inferior basal/apical hypokinesis, RV okay  cMRI 6/24 EF 53% RV ok. No LGE. Mild trabeculaltions (ratio 1.7 - criteria for LVNC >= 2.3)   Here today for f/u with his sister. Says he feels ok. Breathing ok. Says he is moving all the time. Cutting grass and using weedeater. No CP or SOB. No edema, orthopnea or PND. Compliant with meds.   Denies any recent ETOH, tobacco or cocaine use.UDS 4/24 negative cocaine. + THC/opiates   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   History of depression    surrounding original stroke - has since resolved (04/2012)   Hyperlipidemia    Hypertension    Internal carotid artery stenosis    BL and mild per CT angiogram (05/2010)   Stroke (HCC) 2012   Tobacco abuse     Current Outpatient Medications  Medication Sig Dispense Refill   acetaminophen-codeine (TYLENOL #3) 300-30 MG tablet Take 1-2 tablets by mouth every 8 (eight) hours as needed for moderate pain. 30 tablet 1   amLODipine (NORVASC) 10 MG tablet Take 1 tablet (10 mg total) by mouth daily. 90 tablet 1   aspirin 81 MG tablet Take 1  tablet (81 mg total) by mouth daily. 30 tablet 5   atorvastatin (LIPITOR) 80 MG tablet TAKE 1 TABLET(80 MG) BY MOUTH DAILY 90 tablet 1   BIDIL 20-37.5 MG tablet TAKE 2 TABLETS BY MOUTH IN THE MORNING AND AT BEDTIME 120 tablet 6   carvedilol (COREG) 12.5 MG tablet TAKE 1 TABLET(12.5 MG) BY MOUTH TWICE DAILY WITH A MEAL 180 tablet 0   clopidogrel (PLAVIX) 75 MG tablet Take 1 tablet (75 mg total) by mouth daily. 90 tablet 3   cyclobenzaprine (FLEXERIL) 5 MG tablet Take 1 tablet (5 mg total) by mouth 3 (three) times daily as needed for muscle spasms. 60 tablet 2   FARXIGA 10 MG TABS tablet TAKE 1 TABLET(10 MG) BY MOUTH DAILY BEFORE BREAKFAST 90 tablet 0   metFORMIN (GLUCOPHAGE) 500 MG tablet TAKE 2 TABLETS BY MOUTH EVERY MORNING WITH BREAKFAST THEN TAKE 1 TABLET BY MOUTH WITH DINNER 270 tablet 0   spironolactone (ALDACTONE) 25 MG tablet TAKE 1 TABLET(25 MG) BY MOUTH DAILY 90 tablet 1   No current facility-administered medications for this encounter.    Allergies  Allergen Reactions   Benicar [Olmesartan] Anaphylaxis and Swelling    Patient cannot name the site of swelling (??)   Ace Inhibitors Swelling    Patient cannot name the site of swelling (??)   Angiotensin Receptor Blockers Swelling    Patient cannot name the site of swelling (??)  Social History   Socioeconomic History   Marital status: Single    Spouse name: Not on file   Number of children: 1   Years of education: 11th grade   Highest education level: Not on file  Occupational History   Occupation: On disability    Comment: previously worked for [employer] until his stroke in 2011.   Tobacco Use   Smoking status: Former    Current packs/day: 0.00    Average packs/day: 0.3 packs/day for 40.1 years (12.0 ttl pk-yrs)    Types: Cigarettes    Start date: 04/25/1981    Quit date: 05/27/2021    Years since quitting: 1.6   Smokeless tobacco: Never  Vaping Use   Vaping status: Never Used  Substance and Sexual  Activity   Alcohol use: Not Currently   Drug use: Yes    Types: Marijuana    Comment: Former smoke crack    Sexual activity: Yes  Other Topics Concern   Not on file  Social History Narrative   Lives in [place] with wife.   Has been incarcerated multiple times, last time was in 2012 for 8 months.   Social Determinants of Health   Financial Resource Strain: Not on file  Food Insecurity: No Food Insecurity (01/22/2021)   Hunger Vital Sign    Worried About Running Out of Food in the Last Year: Never true    Ran Out of Food in the Last Year: Never true  Transportation Needs: Not on file  Physical Activity: Not on file  Stress: Not on file  Social Connections: Not on file  Intimate Partner Violence: Not on file      Family History  Problem Relation Age of Onset   Diabetes Mother        on dialysis   Heart failure Mother    Hypertension Mother    Hyperlipidemia Mother    Glaucoma Mother    Lung cancer Father    Diabetes Sister    Colon polyps Sister    Arthritis Sister    Hypertension Brother    Diabetes Brother    Pancreatic cancer Brother    Colon cancer Neg Hx    Esophageal cancer Neg Hx     Vitals:   01/18/23 1522  BP: 130/80  Pulse: 66  SpO2: 98%  Weight: 82.8 kg (182 lb 9.6 oz)     PHYSICAL EXAM: General:  Well appearing. No resp difficulty HEENT: normal Neck: supple. no JVD. Carotids 2+ bilat; no bruits. No lymphadenopathy or thryomegaly appreciated. Cor: PMI nondisplaced. Regular rate & rhythm. No rubs, gallops or murmurs. Lungs: clear Abdomen: soft, nontender, nondistended. No hepatosplenomegaly. No bruits or masses. Good bowel sounds. Extremities: no cyanosis, clubbing, rash, edema Neuro: alert & orientedx3, cranial nerves grossly intact. moves all 4 extremities w/o difficulty. Affect pleasant  ECG: SR 66 bpm, bifascicular block  ASSESSMENT & PLAN:  1. Chronic systolic HF due to NICM - Echo 9/19 EF 25-30% - Echo 2/20 EF 25-30 - Cath 9/19  [facility]. No CAD - Echo 12/20 EF improved to 40-45% - ECHO 01/20/2020 EF 45-50%.  RV okay - Echo 09/23: EF 50-55%, LV cavity trabeculated, RV okay - cMRI 6/24 EF 53% RV ok. No LGE. Mild trabeculations (ratio 1.7 - criteria for LVNC >= 2.3)  - Previously suspected due to HTN and substance use - NYHA I. Volume ok  - Continue carvedilol 12.5 bid - Continue spiro 25 daily. - No ACE/ARB/ARNI with angioedema to Benicar in past -  Continue Bidil 2 tabs tid - Continue Farxiga 10mg  daily - Denies further use of ETOH or cocaine. - cMRI shows LV recovery and does not meet diagnostic criteria for LVNC. Continue to follow  - Labs today    2. HTN - Blood pressure well controlled. Continue current regimen.  3. Substance abuse - Denies ETOH/cocaine. Occasional THC use.   4. CVA - On DAPT + statin.  - Suspect cardio-embolic in setting of previous low EF. cMRI does not meet criteria for LVNC and EF has recovered so will not switch to DOAC  - Minimal residual deficit   5. HCV - Treated. last VL undetectable    Arvilla Meres, MD  3:34 PM

## 2023-01-19 NOTE — Telephone Encounter (Signed)
Patient identified by name and date of birth.  Patient was called to verify which medications he needs refilled. Patient stated that he was not home to obtain that information, so he would request his medications through his pharmacy.

## 2023-01-31 ENCOUNTER — Other Ambulatory Visit: Payer: Self-pay | Admitting: Family Medicine

## 2023-01-31 DIAGNOSIS — I5033 Acute on chronic diastolic (congestive) heart failure: Secondary | ICD-10-CM

## 2023-01-31 NOTE — Telephone Encounter (Signed)
Requested Prescriptions  Pending Prescriptions Disp Refills   carvedilol (COREG) 12.5 MG tablet [Pharmacy Med Name: CARVEDILOL 12.5MG  TABLETS] 180 tablet 0    Sig: TAKE 1 TABLET(12.5 MG) BY MOUTH TWICE DAILY WITH A MEAL     Cardiovascular: Beta Blockers 3 Passed - 01/31/2023  6:32 AM      Passed - Cr in normal range and within 360 days    Creat  Date Value Ref Range Status  10/22/2018 0.99 0.70 - 1.33 mg/dL Final    Comment:    For patients >60 years of age, the reference limit for Creatinine is approximately 13% higher for people identified as African-American. .    Creatinine, Ser  Date Value Ref Range Status  01/18/2023 0.96 0.61 - 1.24 mg/dL Final   Creatinine,U  Date Value Ref Range Status  02/27/2010  mg/dL Final   40.9 (NOTE)  Cutoff Values for Urine Drug Screen:        Drug Class           Cutoff (ng/mL)        Amphetamines            1000        Barbiturates             200        Cocaine Metabolites      300        Benzodiazepines          200        Methadone                 300        Opiates                 2000        Phencyclidine             25        Propoxyphene             300        Marijuana Metabolites     50  For medical purposes only.   Creatinine, Urine  Date Value Ref Range Status  02/25/2014 288.4 mg/dL Final    Comment:    No reference range established.         Passed - AST in normal range and within 360 days    AST  Date Value Ref Range Status  09/06/2022 19 0 - 40 IU/L Final         Passed - ALT in normal range and within 360 days    ALT  Date Value Ref Range Status  09/06/2022 17 0 - 44 IU/L Final  10/22/2018 25 9 - 46 U/L Final         Passed - Last BP in normal range    BP Readings from Last 1 Encounters:  01/18/23 130/80         Passed - Last Heart Rate in normal range    Pulse Readings from Last 1 Encounters:  01/18/23 66         Passed - Valid encounter within last 6 months    Recent Outpatient Visits           4  months ago Controlled type 2 diabetes mellitus with diabetic polyneuropathy, without long-term current use of insulin [facility])   [place] [facility] [place], Shea Stakes, NP   8 months ago Essential hypertension   [employer]  [facility] [place], [place] W, NP   11 months ago Controlled type 2 diabetes mellitus with diabetic polyneuropathy, without long-term current use of insulin [facility])   [place] [facility] [place], Shea Stakes, NP   1 year ago Essential hypertension   [place] [facility] [place], Shea Stakes, NP   1 year ago Essential hypertension   [place] [facility] [place], Shea Stakes, NP       Future Appointments             In 1 week Sharon Seller, Marzella Schlein, PA-C [facility]

## 2023-02-04 ENCOUNTER — Other Ambulatory Visit: Payer: Self-pay | Admitting: Nurse Practitioner

## 2023-02-04 DIAGNOSIS — E7800 Pure hypercholesterolemia, unspecified: Secondary | ICD-10-CM

## 2023-02-06 ENCOUNTER — Other Ambulatory Visit: Payer: Self-pay | Admitting: Nurse Practitioner

## 2023-02-06 DIAGNOSIS — M545 Low back pain, unspecified: Secondary | ICD-10-CM

## 2023-02-06 NOTE — Telephone Encounter (Signed)
Requested medication (s) are due for refill today - yes  Requested medication (s) are on the active medication list -yes  Future visit scheduled -yes  Last refill: 09/06/22 #30 1RF  Notes to clinic: non delegated Rx  Requested Prescriptions  Pending Prescriptions Disp Refills   acetaminophen-codeine (TYLENOL #3) 300-30 MG tablet [Pharmacy Med Name: ACETAMINOPHEN/COD #3 (300/30MG ) TAB] 30 tablet     Sig: TAKE 1 TO 2 TABLETS BY MOUTH EVERY 8 HOURS AS NEEDED FOR MODERATE PAIN     Not Delegated - Analgesics:  Opioid Agonist Combinations 2 Failed - 02/06/2023  8:55 AM      Failed - This refill cannot be delegated      Failed - Urine Drug Screen completed in last 360 days      Failed - Valid encounter within last 3 months    Recent Outpatient Visits           5 months ago Controlled type 2 diabetes mellitus with diabetic polyneuropathy, without long-term current use of insulin (HCC)   [place] [facility] [place], Shea Stakes, NP   8 months ago Essential hypertension   [place] [facility] [place], [place] W, NP   11 months ago Controlled type 2 diabetes mellitus with diabetic polyneuropathy, without long-term current use of insulin [facility])   [facility] [place], Shea Stakes, NP   1 year ago Essential hypertension   [place] [facility] [place], Shea Stakes, NP   1 year ago Essential hypertension   [place] [facility] [place], Shea Stakes, NP       Future Appointments             In 2 days Anders Simmonds, [place]  [facility]            Passed - Cr in normal range and within 360 days    Creat  Date Value Ref Range Status  10/22/2018 0.99 0.70 - 1.33 mg/dL Final    Comment:    For patients >52 years of age, the reference limit for Creatinine is approximately 13% higher for people identified as African-American. .     Creatinine, Ser  Date Value Ref Range Status  01/18/2023 0.96 0.61 - 1.24 mg/dL Final   Creatinine,U  Date Value Ref Range Status  02/27/2010  mg/dL Final   40.9 (NOTE)  Cutoff Values for Urine Drug Screen:        Drug Class           Cutoff (ng/mL)        Amphetamines            1000        Barbiturates             200        Cocaine Metabolites      300        Benzodiazepines          200        Methadone                 300        Opiates                 2000        Phencyclidine             25  Propoxyphene             300        Marijuana Metabolites     50  For medical purposes only.   Creatinine, Urine  Date Value Ref Range Status  02/25/2014 288.4 mg/dL Final    Comment:    No reference range established.         Passed - eGFR is 10 or above and within 360 days    GFR, Est African American  Date Value Ref Range Status  10/22/2018 99 > OR = 60 mL/min/1.16m2 Final   GFR calc Af Amer  Date Value Ref Range Status  05/20/2020 114 >59 mL/min/1.73 Final    Comment:    **In accordance with recommendations from the NKF-ASN Task force,**   [facility] is in the process of updating its eGFR calculation to the   2021 CKD-EPI creatinine equation that estimates kidney function   without a race variable.    GFR, Est Non African American  Date Value Ref Range Status  10/22/2018 85 > OR = 60 mL/min/1.52m2 Final   GFR, Estimated  Date Value Ref Range Status  01/18/2023 >60 >60 mL/min Final    Comment:    (NOTE) Calculated using the CKD-EPI Creatinine Equation (2021)    eGFR  Date Value Ref Range Status  09/06/2022 93 >59 mL/min/1.73 Final         Passed - Patient is not pregnant         Requested Prescriptions  Pending Prescriptions Disp Refills   acetaminophen-codeine (TYLENOL #3) 300-30 MG tablet [Pharmacy Med Name: ACETAMINOPHEN/COD #3 (300/30MG ) TAB] 30 tablet     Sig: TAKE 1 TO 2 TABLETS BY MOUTH EVERY 8 HOURS AS NEEDED FOR MODERATE PAIN     Not Delegated  - Analgesics:  Opioid Agonist Combinations 2 Failed - 02/06/2023  8:55 AM      Failed - This refill cannot be delegated      Failed - Urine Drug Screen completed in last 360 days      Failed - Valid encounter within last 3 months    Recent Outpatient Visits           5 months ago Controlled type 2 diabetes mellitus with diabetic polyneuropathy, without long-term current use of insulin (HCC)   [place] [facility] [place], Shea Stakes, NP   8 months ago Essential hypertension   [place] [facility] [place], [place] W, NP   11 months ago Controlled type 2 diabetes mellitus with diabetic polyneuropathy, without long-term current use of insulin [facility])   [place] [facility], Shea Stakes, NP   1 year ago Essential hypertension   [place] [facility] [place], Shea Stakes, NP   1 year ago Essential hypertension   [place] [facility] [place], Shea Stakes, NP       Future Appointments             In 2 days Anders Simmonds, [place]  [facility]            Passed - Cr in normal range and within 360 days    Creat  Date Value Ref Range Status  10/22/2018 0.99 0.70 - 1.33 mg/dL Final    Comment:    For patients >47 years of age, the reference limit for Creatinine is approximately 13% higher for people identified as African-American. Marland Kitchen  Creatinine, Ser  Date Value Ref Range Status  01/18/2023 0.96 0.61 - 1.24 mg/dL Final   Creatinine,U  Date Value Ref Range Status  02/27/2010  mg/dL Final   56.2 (NOTE)  Cutoff Values for Urine Drug Screen:        Drug Class           Cutoff (ng/mL)        Amphetamines            1000        Barbiturates             200        Cocaine Metabolites      300        Benzodiazepines          200        Methadone                 300        Opiates                 2000        Phencyclidine              25        Propoxyphene             300        Marijuana Metabolites     50  For medical purposes only.   Creatinine, Urine  Date Value Ref Range Status  02/25/2014 288.4 mg/dL Final    Comment:    No reference range established.         Passed - eGFR is 10 or above and within 360 days    GFR, Est African American  Date Value Ref Range Status  10/22/2018 99 > OR = 60 mL/min/1.70m2 Final   GFR calc Af Amer  Date Value Ref Range Status  05/20/2020 114 >59 mL/min/1.73 Final    Comment:    **In accordance with recommendations from the NKF-ASN Task force,**   [facility] is in the process of updating its eGFR calculation to the   2021 CKD-EPI creatinine equation that estimates kidney function   without a race variable.    GFR, Est Non African American  Date Value Ref Range Status  10/22/2018 85 > OR = 60 mL/min/1.25m2 Final   GFR, Estimated  Date Value Ref Range Status  01/18/2023 >60 >60 mL/min Final    Comment:    (NOTE) Calculated using the CKD-EPI Creatinine Equation (2021)    eGFR  Date Value Ref Range Status  09/06/2022 93 >59 mL/min/1.73 Final         Passed - Patient is not pregnant

## 2023-02-06 NOTE — Telephone Encounter (Signed)
Requested medication (s) are due for refill today: yes  Requested medication (s) are on the active medication list: yes  Last refill:  04/11/22 #90/1  Future visit scheduled: yes  Notes to clinic:  Unable to refill per protocol due to failed labs, no updated results.      Requested Prescriptions  Pending Prescriptions Disp Refills   atorvastatin (LIPITOR) 80 MG tablet [Pharmacy Med Name: ATORVASTATIN 80MG  TABLETS] 90 tablet 1    Sig: TAKE 1 TABLET(80 MG) BY MOUTH DAILY     Cardiovascular:  Antilipid - Statins Failed - 02/04/2023  8:15 PM      Failed - Lipid Panel in normal range within the last 12 months    Cholesterol, Total  Date Value Ref Range Status  09/20/2021 110 100 - 199 mg/dL Final   LDL Chol Calc (NIH)  Date Value Ref Range Status  09/20/2021 61 0 - 99 mg/dL Final   HDL  Date Value Ref Range Status  09/20/2021 36 (L) >39 mg/dL Final   Triglycerides  Date Value Ref Range Status  09/20/2021 59 0 - 149 mg/dL Final         Passed - Patient is not pregnant      Passed - Valid encounter within last 12 months    Recent Outpatient Visits           5 months ago Controlled type 2 diabetes mellitus with diabetic polyneuropathy, without long-term current use of insulin (HCC)   [place] [facility] [place], Shea Stakes, NP   8 months ago Essential hypertension   [place] [facility] [place], [place] W, NP   11 months ago Controlled type 2 diabetes mellitus with diabetic polyneuropathy, without long-term current use of insulin [facility])   [place] [facility] [place], Shea Stakes, NP   1 year ago Essential hypertension   [place] [facility] [place], Shea Stakes, NP   1 year ago Essential hypertension   [place] [facility] [place], Shea Stakes, NP       Future Appointments             In 2 days Anders Simmonds, PA-C  [facility]

## 2023-02-08 ENCOUNTER — Ambulatory Visit: Payer: Medicaid Other | Attending: Nurse Practitioner | Admitting: Physician Assistant

## 2023-02-08 ENCOUNTER — Encounter: Payer: Self-pay | Admitting: Physician Assistant

## 2023-02-08 VITALS — BP 133/80 | HR 72 | Wt 179.8 lb

## 2023-02-08 DIAGNOSIS — I5033 Acute on chronic diastolic (congestive) heart failure: Secondary | ICD-10-CM | POA: Diagnosis not present

## 2023-02-08 DIAGNOSIS — E7800 Pure hypercholesterolemia, unspecified: Secondary | ICD-10-CM | POA: Diagnosis not present

## 2023-02-08 DIAGNOSIS — Z8673 Personal history of transient ischemic attack (TIA), and cerebral infarction without residual deficits: Secondary | ICD-10-CM | POA: Diagnosis not present

## 2023-02-08 DIAGNOSIS — Z7984 Long term (current) use of oral hypoglycemic drugs: Secondary | ICD-10-CM

## 2023-02-08 DIAGNOSIS — E1142 Type 2 diabetes mellitus with diabetic polyneuropathy: Secondary | ICD-10-CM | POA: Diagnosis not present

## 2023-02-08 DIAGNOSIS — Z23 Encounter for immunization: Secondary | ICD-10-CM | POA: Diagnosis not present

## 2023-02-08 DIAGNOSIS — E1165 Type 2 diabetes mellitus with hyperglycemia: Secondary | ICD-10-CM | POA: Diagnosis not present

## 2023-02-08 DIAGNOSIS — I10 Essential (primary) hypertension: Secondary | ICD-10-CM

## 2023-02-08 LAB — POCT GLYCOSYLATED HEMOGLOBIN (HGB A1C): HbA1c, POC (controlled diabetic range): 6.4 % (ref 0.0–7.0)

## 2023-02-08 MED ORDER — CLOPIDOGREL BISULFATE 75 MG PO TABS
75.0000 mg | ORAL_TABLET | Freq: Every day | ORAL | 3 refills | Status: AC
Start: 2023-02-08 — End: ?

## 2023-02-08 MED ORDER — BIDIL 20-37.5 MG PO TABS
ORAL_TABLET | ORAL | 6 refills | Status: DC
Start: 2023-02-08 — End: 2023-11-03

## 2023-02-08 MED ORDER — SPIRONOLACTONE 25 MG PO TABS
ORAL_TABLET | ORAL | 1 refills | Status: DC
Start: 2023-02-08 — End: 2023-10-18

## 2023-02-08 MED ORDER — DAPAGLIFLOZIN PROPANEDIOL 10 MG PO TABS: 10.0000 mg | ORAL_TABLET | Freq: Every day | ORAL | 0 refills | Status: DC

## 2023-02-08 MED ORDER — METFORMIN HCL 500 MG PO TABS
ORAL_TABLET | ORAL | 1 refills | Status: DC
Start: 2023-02-08 — End: 2023-10-11

## 2023-02-08 MED ORDER — AMLODIPINE BESYLATE 10 MG PO TABS
10.0000 mg | ORAL_TABLET | Freq: Every day | ORAL | 1 refills | Status: DC
Start: 2023-02-08 — End: 2023-08-22

## 2023-02-08 MED ORDER — ATORVASTATIN CALCIUM 80 MG PO TABS
ORAL_TABLET | ORAL | 1 refills | Status: DC
Start: 2023-02-08 — End: 2023-08-02

## 2023-02-08 MED ORDER — CARVEDILOL 12.5 MG PO TABS
12.5000 mg | ORAL_TABLET | Freq: Two times a day (BID) | ORAL | 0 refills | Status: DC
Start: 2023-02-08 — End: 2023-05-24

## 2023-02-08 NOTE — Progress Notes (Signed)
Patient ID: Ronald Marsh, male   DOB: May 30, 1960, 62 y.o.   [name]: [id number]   Ronald Marsh, is a 62 y.o. male  [id number]  [id number]  DOB - Sep 18, 1960  Chief Complaint  Patient presents with   Medication Refill   Diabetes       Subjective:   Ronald Marsh is a 62 y.o. male here today for med RF.  No new issues or concerns.  Seen by cardiology 01/18/2023.  No med changes.  He is unsure what meds they did and did not RF.  He is interested in getting his flu shot today.  His sister is here with him.    No problems updated.  ALLERGIES: Allergies  Allergen Reactions   Benicar [Olmesartan] Anaphylaxis and Swelling    Patient cannot name the site of swelling (??)   Ace Inhibitors Swelling    Patient cannot name the site of swelling (??)   Angiotensin Receptor Blockers Swelling    Patient cannot name the site of swelling (??)    PAST MEDICAL HISTORY: 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   History of depression    surrounding original stroke - has since resolved (04/2012)   Hyperlipidemia    Hypertension    Internal carotid artery stenosis    BL and mild per CT angiogram (05/2010)   Stroke (HCC) 2012   Tobacco abuse     MEDICATIONS AT HOME: Prior to Admission medications   Medication Sig Start Date End Date Taking? Authorizing Provider  acetaminophen-codeine (TYLENOL #3) 300-30 MG tablet Take 1-2 tablets by mouth every 8 (eight) hours as needed for moderate pain. 09/06/22  Yes Claiborne Rigg, NP  cyclobenzaprine (FLEXERIL) 5 MG tablet Take 1 tablet (5 mg total) by mouth 3 (three) times daily as needed for muscle spasms. 06/03/22  Yes Claiborne Rigg, NP  amLODipine (NORVASC) 10 MG tablet Take 1 tablet (10 mg total) by mouth daily.  02/08/23   Anders Simmonds, PA-C  atorvastatin (LIPITOR) 80 MG tablet TAKE 1 TABLET(80 MG) BY MOUTH DAILY 02/08/23   Anders Simmonds, PA-C  BIDIL 20-37.5 MG tablet TAKE 2 TABLETS BY MOUTH IN THE MORNING AND AT BEDTIME 02/08/23   Georgian Co M, PA-C  carvedilol (COREG) 12.5 MG tablet Take 1 tablet (12.5 mg total) by mouth 2 (two) times daily with a meal. 02/08/23   Shaneece Stockburger, Marzella Schlein, PA-C  clopidogrel (PLAVIX) 75 MG tablet Take 1 tablet (75 mg total) by mouth daily. 02/08/23   Anders Simmonds, PA-C  dapagliflozin propanediol (FARXIGA) 10 MG TABS tablet Take 1 tablet (10 mg total) by mouth daily. 02/08/23   Anders Simmonds, PA-C  metFORMIN (GLUCOPHAGE) 500 MG tablet TAKE 2 TABLETS BY MOUTH EVERY MORNING WITH BREAKFAST THEN TAKE 1 TABLET BY MOUTH WITH DINNER 02/08/23   Georgian Co M, PA-C  spironolactone (ALDACTONE) 25 MG tablet TAKE 1 TABLET(25 MG) BY MOUTH DAILY 02/08/23   [place], Marzella Schlein, PA-C    ROS: Neg HEENT Neg resp Neg cardiac Neg GI Neg GU Neg MS Neg psych Neg neuro  Objective:   Vitals:   02/08/23 1622  BP: 133/80  Pulse: 72  SpO2: 97%  Weight: 179 lb 12.8 oz (81.6 kg)   Exam General appearance : Awake, alert, not  in any distress. Speech Clear. Not toxic looking HEENT: Atraumatic and Normocephalic Neck: Supple, no JVD. No cervical lymphadenopathy.  Chest: Good air entry bilaterally, CTAB.  No rales/rhonchi/wheezing CVS: S1 S2 regular, no murmurs.  Extremities: B/L Lower Ext shows no edema, both legs are warm to touch Neurology: Awake alert, and oriented X 3, CN II-XII intact, Non focal Skin: No Rash  Data Review Lab Results  Component Value Date   HGBA1C 6.4 02/08/2023   HGBA1C 6.1 09/06/2022   HGBA1C 6.4 03/02/2022    Assessment & Plan   1. Type 2 diabetes mellitus with hyperglycemia, without long-term current use of insulin (HCC) Controlled-but work on low sugar/low starch diet - HgB A1c  2. Acute on chronic diastolic CHF (congestive heart  failure) (HCC) Stable-continue - BIDIL 20-37.5 MG tablet; TAKE 2 TABLETS BY MOUTH IN THE MORNING AND AT BEDTIME  Dispense: 120 tablet; Refill: 6 - spironolactone (ALDACTONE) 25 MG tablet; TAKE 1 TABLET(25 MG) BY MOUTH DAILY  Dispense: 90 tablet; Refill: 1 - carvedilol (COREG) 12.5 MG tablet; Take 1 tablet (12.5 mg total) by mouth 2 (two) times daily with a meal.  Dispense: 180 tablet; Refill: 0  3. History of CVA (cerebrovascular accident) - clopidogrel (PLAVIX) 75 MG tablet; Take 1 tablet (75 mg total) by mouth daily.  Dispense: 90 tablet; Refill: 3  4. Essential hypertension - amLODipine (NORVASC) 10 MG tablet; Take 1 tablet (10 mg total) by mouth daily.  Dispense: 90 tablet; Refill: 1  5. Controlled type 2 diabetes mellitus with diabetic polyneuropathy, without long-term current use of insulin (HCC) - dapagliflozin propanediol (FARXIGA) 10 MG TABS tablet; Take 1 tablet (10 mg total) by mouth daily.  Dispense: 90 tablet; Refill: 0 - metFORMIN (GLUCOPHAGE) 500 MG tablet; TAKE 2 TABLETS BY MOUTH EVERY MORNING WITH BREAKFAST THEN TAKE 1 TABLET BY MOUTH WITH DINNER  Dispense: 270 tablet; Refill: 1  6. Pure hypercholesterolemia - atorvastatin (LIPITOR) 80 MG tablet; TAKE 1 TABLET(80 MG) BY MOUTH DAILY  Dispense: 90 tablet; Refill: 1    Return in about 6 months (around 08/09/2023) for PCP for chronic conditions-[place].  The patient was given clear instructions to go to ER or return to medical center if symptoms don't improve, worsen or new problems develop. The patient verbalized understanding. The patient was told to call to get lab results if they haven't heard anything in the next week.      Georgian Co, PA-C [facility] [place], [place] [phone]   02/08/2023, 4:44 PM

## 2023-03-08 DIAGNOSIS — E119 Type 2 diabetes mellitus without complications: Secondary | ICD-10-CM | POA: Diagnosis not present

## 2023-03-08 LAB — HM DIABETES EYE EXAM

## 2023-03-09 DIAGNOSIS — H5213 Myopia, bilateral: Secondary | ICD-10-CM | POA: Diagnosis not present

## 2023-03-29 DIAGNOSIS — H5213 Myopia, bilateral: Secondary | ICD-10-CM | POA: Diagnosis not present

## 2023-05-16 ENCOUNTER — Emergency Department (HOSPITAL_COMMUNITY)
Admission: EM | Admit: 2023-05-16 | Discharge: 2023-05-16 | Disposition: A | Discharge status: Home / Self Care | Payer: Medicaid Other | Attending: Emergency Medicine | Admitting: Emergency Medicine

## 2023-05-16 ENCOUNTER — Other Ambulatory Visit: Payer: Self-pay

## 2023-05-16 ENCOUNTER — Emergency Department (HOSPITAL_COMMUNITY): Payer: Medicaid Other

## 2023-05-16 DIAGNOSIS — J019 Acute sinusitis, unspecified: Secondary | ICD-10-CM | POA: Diagnosis not present

## 2023-05-16 DIAGNOSIS — E119 Type 2 diabetes mellitus without complications: Secondary | ICD-10-CM | POA: Diagnosis not present

## 2023-05-16 DIAGNOSIS — I10 Essential (primary) hypertension: Secondary | ICD-10-CM | POA: Insufficient documentation

## 2023-05-16 DIAGNOSIS — R059 Cough, unspecified: Secondary | ICD-10-CM | POA: Diagnosis present

## 2023-05-16 DIAGNOSIS — R42 Dizziness and giddiness: Secondary | ICD-10-CM | POA: Diagnosis not present

## 2023-05-16 DIAGNOSIS — F17200 Nicotine dependence, unspecified, uncomplicated: Secondary | ICD-10-CM | POA: Diagnosis not present

## 2023-05-16 DIAGNOSIS — R0602 Shortness of breath: Secondary | ICD-10-CM | POA: Diagnosis not present

## 2023-05-16 DIAGNOSIS — R39198 Other difficulties with micturition: Secondary | ICD-10-CM | POA: Diagnosis not present

## 2023-05-16 LAB — BASIC METABOLIC PANEL
Anion gap: 11 (ref 5–15)
BUN: 15 mg/dL (ref 6–20)
CO2: 23 mmol/L (ref 22–32)
Calcium: 9.6 mg/dL (ref 8.9–10.3)
Chloride: 103 mmol/L (ref 98–111)
Creatinine, Ser: 0.72 mg/dL (ref 0.61–1.24)
GFR, Estimated: >60 mL/min (ref >60)
Glucose, Bld: 142 mg/dL — ABNORMAL HIGH (ref 70–99)
Potassium: 3.5 mmol/L (ref 3.5–5.1)
Sodium: 137 mmol/L (ref 135–145)

## 2023-05-16 LAB — CBC
HCT: 40.8 % (ref 39.0–52.0)
Hemoglobin: 13.7 g/dL (ref 13.0–17.0)
MCH: 30.9 pg (ref 26.0–34.0)
MCHC: 33.6 g/dL (ref 30.0–36.0)
MCV: 91.9 fL (ref 80.0–100.0)
Platelets: 295 10*3/uL (ref 150–400)
RBC: 4.44 MIL/uL (ref 4.22–5.81)
RDW: 13.7 % (ref 11.5–15.5)
WBC: 5.5 10*3/uL (ref 4.0–10.5)
nRBC: 0 % (ref 0.0–0.2)

## 2023-05-16 LAB — URINALYSIS, ROUTINE W REFLEX MICROSCOPIC
Bacteria, UA: NONE SEEN
Bilirubin Urine: NEGATIVE
Glucose, UA: >=500 mg/dL — AB
Hgb urine dipstick: NEGATIVE
Ketones, ur: 5 mg/dL — AB
Leukocytes,Ua: NEGATIVE
Nitrite: NEGATIVE
Protein, ur: NEGATIVE mg/dL
Specific Gravity, Urine: 1.037 — ABNORMAL HIGH (ref 1.005–1.030)
pH: 5 (ref 5.0–8.0)

## 2023-05-16 MED ORDER — TAMSULOSIN HCL 0.4 MG PO CAPS: 0.4000 mg | ORAL_CAPSULE | Freq: Every day | ORAL | 0 refills | Status: DC

## 2023-05-16 MED ORDER — AMOXICILLIN 500 MG PO CAPS: 1000.0000 mg | ORAL_CAPSULE | Freq: Three times a day (TID) | ORAL | 0 refills | Status: AC

## 2023-05-16 NOTE — Discharge Instructions (Signed)
 Your workup today was reassuring.  Follow-up with the urologist.  I have sent Flomax into the pharmacy for you along with amoxicillin for the sinus infection.  If you have any concerning symptoms return to the emergency room.

## 2023-05-16 NOTE — ED Notes (Signed)
 Post void residual - 1mL. Pt denies abdominal pain and denies the urge to void at this time.

## 2023-05-16 NOTE — ED Provider Triage Note (Signed)
 Emergency Medicine Provider Triage Evaluation Note  Ronald Marsh , a 63 y.o. male  was evaluated in triage.  Pt complains of shortness of breath.  Review of Systems  Positive: Shortness of breath, nausea Negative: Chest pain  Physical Exam  BP 108/74 (BP Location: Left Arm)   Pulse 94   Temp 98 F (36.7 C)   Resp 18   Ht 6' 2 (1.88 m)   Wt 72.6 kg   SpO2 97%   BMI 20.54 kg/m  Gen:   Awake, no distress speaking clearly Resp:  Normal effort no increased work of breathing MSK:   Moves extremities without difficulty no deformity  Medical Decision Making  Medically screening exam initiated at 9:43 AM.  Appropriate orders placed.  Ronald Marsh was informed that the remainder of the evaluation will be completed by another provider, this initial triage assessment does not replace that evaluation, and the importance of remaining in the ED until their evaluation is complete.   Ronald Charleston, MD 05/16/23 [phone]

## 2023-05-16 NOTE — ED Triage Notes (Addendum)
 Pt. Is here due to urinary retention, slow flow.  Pt. Has to apply pressure to lower abdomen for urine to flow at time.  Denies any dsyuria , pressure or pain.  Pt 'sSkin is warm and dry. Pt. Has a hx of heart failure and is having increased periods of sob and dizziness.

## 2023-05-16 NOTE — ED Provider Notes (Signed)
 [place] EMERGENCY DEPARTMENT AT [facility] Provider Note   CSN: [id number] Arrival date & time: 05/16/23  9161     History  Chief Complaint  Patient presents with   Shortness of Breath    Pt. Arrived with having difficulty urinating slow and then had a period of sob, dizziness.       Ronald Marsh is a 63 y.o. male.  63-year-old male presents today with couple complaints.  1 is that he has a slow urine stream.  He states he had a urological procedure couple years ago following similar issue however he states it is worse this time.  He states he is also urinating frequently.  Urinary complaints of slow stream and frequent urination has been ongoing for a couple months.  He denies abdominal pain, fever, penile discharge, or dysuria.  He also endorses sinus congestion, postnasal drip, as well as a productive cough.  The symptoms have been ongoing for 2 weeks.  Denies shortness of breath or chest pain.  The history is provided by the patient. No language interpreter was used.       Home Medications Prior to Admission medications   Medication Sig Start Date End Date Taking? Authorizing Provider  acetaminophen -codeine  (TYLENOL  #3) 300-30 MG tablet Take 1-2 tablets by mouth every 8 (eight) hours as needed for moderate pain. 09/06/22   Fleming, Zelda W, NP  amLODipine  (NORVASC ) 10 MG tablet Take 1 tablet (10 mg total) by mouth daily. 02/08/23   Danton Jon HERO, PA-C  atorvastatin  (LIPITOR ) 80 MG tablet TAKE 1 TABLET(80 MG) BY MOUTH DAILY 02/08/23   Danton Jon HERO, PA-C  BIDIL  20-37.5 MG tablet TAKE 2 TABLETS BY MOUTH IN THE MORNING AND AT BEDTIME 02/08/23   McClung, Angela M, PA-C  carvedilol  (COREG ) 12.5 MG tablet Take 1 tablet (12.5 mg total) by mouth 2 (two) times daily with a meal. 02/08/23   McClung, Jon HERO, PA-C  clopidogrel  (PLAVIX ) 75 MG tablet Take 1 tablet (75 mg total) by mouth daily. 02/08/23   Danton Jon HERO, PA-C  cyclobenzaprine  (FLEXERIL ) 5 MG tablet Take 1  tablet (5 mg total) by mouth 3 (three) times daily as needed for muscle spasms. 06/03/22   Fleming, Zelda W, NP  dapagliflozin  propanediol (FARXIGA ) 10 MG TABS tablet Take 1 tablet (10 mg total) by mouth daily. 02/08/23   Danton Jon HERO, PA-C  metFORMIN  (GLUCOPHAGE ) 500 MG tablet TAKE 2 TABLETS BY MOUTH EVERY MORNING WITH BREAKFAST THEN TAKE 1 TABLET BY MOUTH WITH DINNER 02/08/23   Danton Jon M, PA-C  spironolactone  (ALDACTONE ) 25 MG tablet TAKE 1 TABLET(25 MG) BY MOUTH DAILY 02/08/23   McClung, Angela M, PA-C      Allergies    Benicar [olmesartan], Ace inhibitors, and Angiotensin receptor blockers    Review of Systems   Review of Systems  Constitutional:  Negative for chills and fever.  Genitourinary:  Positive for difficulty urinating and frequency. Negative for dysuria and flank pain.  Neurological:  Negative for light-headedness.  All other systems reviewed and are negative.   Physical Exam Updated Vital Signs BP 108/74 (BP Location: Left Arm)   Pulse 94   Temp 98 F (36.7 C)   Resp 18   Ht 6' 2 (1.88 m)   Wt 72.6 kg   SpO2 97%   BMI 20.54 kg/m  Physical Exam Vitals and nursing note reviewed.  Constitutional:      General: He is not in acute distress.    Appearance: Normal appearance.  He is not ill-appearing.  HENT:     Head: Normocephalic and atraumatic.     Nose: Nose normal.  Eyes:     General: No scleral icterus.    Extraocular Movements: Extraocular movements intact.     Conjunctiva/sclera: Conjunctivae normal.  Cardiovascular:     Rate and Rhythm: Normal rate and regular rhythm.     Heart sounds: Normal heart sounds.  Pulmonary:     Effort: Pulmonary effort is normal. No respiratory distress.     Breath sounds: Normal breath sounds. No wheezing or rales.  Abdominal:     General: There is no distension.     Palpations: Abdomen is soft.     Tenderness: There is no abdominal tenderness. There is no guarding.  Musculoskeletal:        General: Normal  range of motion.     Cervical back: Normal range of motion.  Skin:    General: Skin is warm and dry.  Neurological:     General: No focal deficit present.     Mental Status: He is alert. Mental status is at baseline.     ED Results / Procedures / Treatments   Labs (all labs ordered are listed, but only abnormal results are displayed) Labs Reviewed  URINALYSIS, ROUTINE W REFLEX MICROSCOPIC - Abnormal; Notable for the following components:      Result Value   Specific Gravity, Urine 1.037 (*)    Glucose, UA >=500 (*)    Ketones, ur 5 (*)    All other components within normal limits  BASIC METABOLIC PANEL - Abnormal; Notable for the following components:   Glucose, Bld 142 (*)    All other components within normal limits  CBC    EKG EKG Interpretation Date/Time:  Tuesday May 16 2023 09:42:49 EST Ventricular Rate:  92 PR Interval:  172 QRS Duration:  154 QT Interval:  402 QTC Calculation: 497 R Axis:   -73  Text Interpretation: Normal sinus rhythm Right bundle branch block Left anterior fascicular block Bifascicular block Left ventricular hypertrophy with repolarization abnormality ( R in aVL , Romhilt-Estes ) T wave abnormality Abnormal ECG Confirmed by Garrick Charleston [phone]) on 05/16/2023 10:06:18 AM  Radiology DG Chest 2 View Result Date: 05/16/2023 CLINICAL DATA:  Shortness of breath and dizziness. EXAM: CHEST - 2 VIEW COMPARISON:  Chest radiograph dated 05/05/2018. FINDINGS: No focal consolidation, pleural effusion, or pneumothorax. Bilateral nipple shadows noted. The cardiac silhouette is within normal limits. No acute osseous pathology. IMPRESSION: No active cardiopulmonary disease. Electronically Signed   By: Vanetta Chou M.D.   On: 05/16/2023 10:03    Procedures Procedures    Medications Ordered in ED Medications - No data to display  ED Course/ Medical Decision Making/ A&P                                 Medical Decision Making Amount and/or  Complexity of Data Reviewed Labs: ordered. Radiology: ordered.   Medical Decision Making / ED Course   This patient presents to the ED for concern of decreased urine flow, sinus congestion, this involves an extensive number of treatment options, and is a complaint that carries with it a high risk of complications and morbidity.  The differential diagnosis includes sinusitis, URI, post outlet obstruction, BPH  MDM: 63 year old male presents with above-mentioned complaint.  Given his sinus congestion, postnasal drip, productive cough has been ongoing for couple weeks we will treat  with amoxicillin . From a urological complaint he is voiding.  Postvoid residual was 1 mL.  Will start patient on Flomax  and give urology referral. X-ray without acute concern.  CBC and BMP without acute concern.  Discharged in stable condition.   Lab Tests: -I ordered, reviewed, and interpreted labs.   The pertinent results include:   Labs Reviewed  URINALYSIS, ROUTINE W REFLEX MICROSCOPIC - Abnormal; Notable for the following components:      Result Value   Specific Gravity, Urine 1.037 (*)    Glucose, UA >=500 (*)    Ketones, ur 5 (*)    All other components within normal limits  BASIC METABOLIC PANEL - Abnormal; Notable for the following components:   Glucose, Bld 142 (*)    All other components within normal limits  CBC      EKG  EKG Interpretation Date/Time:  Tuesday May 16 2023 09:42:49 EST Ventricular Rate:  92 PR Interval:  172 QRS Duration:  154 QT Interval:  402 QTC Calculation: 497 R Axis:   -73  Text Interpretation: Normal sinus rhythm Right bundle branch block Left anterior fascicular block Bifascicular block Left ventricular hypertrophy with repolarization abnormality ( R in aVL , Romhilt-Estes ) T wave abnormality Abnormal ECG Confirmed by Garrick Charleston [phone]) on 05/16/2023 10:06:18 AM         Imaging Studies ordered: I ordered imaging studies including cxr I  independently visualized and interpreted imaging. I agree with the radiologist interpretation   Medicines ordered and prescription drug management: No orders of the defined types were placed in this encounter.   -I have reviewed the patients home medicines and have made adjustments as needed   Reevaluation: After the interventions noted above, I reevaluated the patient and found that they have :improved  Co morbidities that complicate the patient evaluation  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   History of depression    surrounding original stroke - has since resolved (04/2012)   Hyperlipidemia    Hypertension    Internal carotid artery stenosis    BL and mild per CT angiogram (05/2010)   Stroke (HCC) 2012   Tobacco abuse       Dispostion: Discharged in stable condition.  Return precaution discussed.  Patient voices understanding and is in agreement with plan.   Final Clinical Impression(s) / ED Diagnoses Final diagnoses:  Acute sinusitis, recurrence not specified, unspecified location  Decreased urine stream    Rx / DC Orders ED Discharge Orders          Ordered    tamsulosin  (FLOMAX ) 0.4 MG CAPS capsule  Daily        05/16/23 1226    amoxicillin  (AMOXIL ) 500 MG capsule  3 times daily        05/16/23 1228              Hildegard Loge, PA-C 05/16/23 1229    Charlyn Sora, MD 05/17/23 1324

## 2023-05-16 NOTE — ED Notes (Signed)
 Pt up to bathroom with minimal assistance. Pt able to void at this time.

## 2023-05-24 ENCOUNTER — Ambulatory Visit: Payer: Medicaid Other | Attending: Nurse Practitioner | Admitting: Nurse Practitioner

## 2023-05-24 ENCOUNTER — Encounter: Payer: Self-pay | Admitting: Nurse Practitioner

## 2023-05-24 VITALS — BP 162/85 | HR 87 | Resp 19 | Ht 74.0 in | Wt 184.4 lb

## 2023-05-24 DIAGNOSIS — E1165 Type 2 diabetes mellitus with hyperglycemia: Secondary | ICD-10-CM

## 2023-05-24 DIAGNOSIS — I10 Essential (primary) hypertension: Secondary | ICD-10-CM

## 2023-05-24 DIAGNOSIS — N401 Enlarged prostate with lower urinary tract symptoms: Secondary | ICD-10-CM

## 2023-05-24 DIAGNOSIS — G8929 Other chronic pain: Secondary | ICD-10-CM | POA: Diagnosis not present

## 2023-05-24 DIAGNOSIS — R3911 Hesitancy of micturition: Secondary | ICD-10-CM

## 2023-05-24 DIAGNOSIS — Z7985 Long-term (current) use of injectable non-insulin antidiabetic drugs: Secondary | ICD-10-CM

## 2023-05-24 DIAGNOSIS — M25562 Pain in left knee: Secondary | ICD-10-CM

## 2023-05-24 DIAGNOSIS — Z09 Encounter for follow-up examination after completed treatment for conditions other than malignant neoplasm: Secondary | ICD-10-CM | POA: Diagnosis not present

## 2023-05-24 DIAGNOSIS — I5033 Acute on chronic diastolic (congestive) heart failure: Secondary | ICD-10-CM

## 2023-05-24 DIAGNOSIS — Z23 Encounter for immunization: Secondary | ICD-10-CM | POA: Diagnosis not present

## 2023-05-24 MED ORDER — CARVEDILOL 12.5 MG PO TABS
12.5000 mg | ORAL_TABLET | Freq: Two times a day (BID) | ORAL | 1 refills | Status: DC
Start: 2023-05-24 — End: 2024-03-08

## 2023-05-24 MED ORDER — DICLOFENAC SODIUM 1 % EX GEL
4.0000 g | Freq: Four times a day (QID) | CUTANEOUS | 1 refills | Status: AC
Start: 2023-05-24 — End: 2023-06-23

## 2023-05-24 MED ORDER — TAMSULOSIN HCL 0.4 MG PO CAPS
0.4000 mg | ORAL_CAPSULE | Freq: Every day | ORAL | 3 refills | Status: AC
Start: 2023-05-24 — End: ?

## 2023-05-24 MED ORDER — DAPAGLIFLOZIN PROPANEDIOL 10 MG PO TABS
10.0000 mg | ORAL_TABLET | Freq: Every day | ORAL | 1 refills | Status: DC
Start: 2023-05-24 — End: 2023-12-04

## 2023-05-24 NOTE — Progress Notes (Addendum)
Assessment & Plan:  Dalano was seen today for ed visit .  Diagnoses and all orders for this visit:  Hospital discharge follow-up  Urinary hesitancy due to benign prostatic hyperplasia If no improvement will refer to Urology. He does no endorse any symptoms of STD infection.  -     tamsulosin (FLOMAX) 0.4 MG CAPS capsule; Take 1 capsule (0.4 mg total) by mouth daily. For urine stream  Essential hypertension -     carvedilol (COREG) 12.5 MG tablet; Take 1 tablet (12.5 mg total) by mouth 2 (two) times daily with a meal. Continue all antihypertensives as prescribed.  Reminded to bring in blood pressure log for follow  up appointment.  RECOMMENDATIONS: DASH/Mediterranean Diets are healthier choices for HTN.    Chronic pain of left knee If no improvement in a few weeks. Will need xray/MRI/ortho referral -     diclofenac Sodium (VOLTAREN) 1 % GEL; Apply 4 g topically 4 (four) times daily.  Acute on chronic diastolic CHF (congestive heart failure) (HCC) -     carvedilol (COREG) 12.5 MG tablet; Take 1 tablet (12.5 mg total) by mouth 2 (two) times daily with a meal. -     dapagliflozin propanediol (FARXIGA) 10 MG TABS tablet; Take 1 tablet (10 mg total) by mouth daily DASH DIET  Type 2 diabetes mellitus with hyperglycemia, without long-term current use of insulin (HCC) -     dapagliflozin propanediol (FARXIGA) 10 MG TABS tablet; Take 1 tablet (10 mg total) by mouth daily. -     Urine Albumin/Creatinine with ratio (send out) [LAB689] Continue blood sugar control as discussed in office today, low carbohydrate diet, and regular physical exercise as tolerated, 150 minutes per week (30 min each day, 5 days per week, or 50 min 3 days per week). Keep blood sugar logs with fasting goal of 90-130 mg/dl, post prandial (after you eat) less than 180.  For Hypoglycemia: BS <60 and Hyperglycemia BS >400; contact the clinic ASAP. Annual eye exams and foot exams are recommended.   Need for pneumococcal  20-valent conjugate vaccination -     Pneumococcal conjugate vaccine 20-valent    Patient has been counseled on age-appropriate routine health concerns for screening and prevention. These are reviewed and up-to-date. Referrals have been placed accordingly. Immunizations are up-to-date or declined.    Subjective:   Chief Complaint  Patient presents with   ED Visit     Follow up    Ronald Marsh 63 y.o. male presents to office today for HFU He is accompanied by his sister.    Ronald Marsh was seen in the ED on 05-16-2023 and treated for bacterial sinusitis with amoxicillin and slow and alternating frequency of urinary stream with tamsulosin. He was also instructed to follow up with Urology.    Today he states he stepped into a hole 1 week ago and reinjured his left knee. He initially injured it 2-3 years ago but has been stable overall in regard to pain. States when he fell he thinks he hyperextended the knee. Has been using topical analgesic with little relief.    In regard to the urinary hesitancy and frequency. He endorses persistent symptoms however he also states he never received the tamsulosin.    Blood pressure is elevated. He endorses adherence taking his blood pressure medications daily. He is currently prescribed amlodipine, carvedilol, bidil and spironolactone.  BP Readings from Last 3 Encounters:  05/24/23 (!) 162/85  05/16/23 125/83  02/08/23 133/80  Review of Systems  Constitutional:  Negative for fever, malaise/fatigue and weight loss.  HENT: Negative.  Negative for nosebleeds.   Eyes: Negative.  Negative for blurred vision, double vision and photophobia.  Respiratory: Negative.  Negative for cough and shortness of breath.   Cardiovascular: Negative.  Negative for chest pain, palpitations and leg swelling.  Gastrointestinal: Negative.  Negative for heartburn, nausea and vomiting.  Genitourinary:  Positive for frequency. Negative for dysuria, flank pain,  hematuria and urgency.  Musculoskeletal:  Positive for joint pain. Negative for myalgias.  Neurological: Negative.  Negative for dizziness, focal weakness, seizures and headaches.  Psychiatric/Behavioral: Negative.  Negative for suicidal ideas.     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   History of depression    surrounding original stroke - has since resolved (04/2012)   Hyperlipidemia    Hypertension    Internal carotid artery stenosis    BL and mild per CT angiogram (05/2010)   Stroke (HCC) 2012   Tobacco abuse     Past Surgical History:  Procedure Laterality Date   NO PAST SURGERIES      Family History  Problem Relation Age of Onset   Diabetes Mother        on dialysis   Heart failure Mother    Hypertension Mother    Hyperlipidemia Mother    Glaucoma Mother    Lung cancer Father    Diabetes Sister    Colon polyps Sister    Arthritis Sister    Hypertension Brother    Diabetes Brother    Pancreatic cancer Brother    Colon cancer Neg Hx    Esophageal cancer Neg Hx     Social History Reviewed with no changes to be made today.   Outpatient Medications Prior to Visit  Medication Sig Dispense Refill   amLODipine (NORVASC) 10 MG tablet Take 1 tablet (10 mg total) by mouth daily. 90 tablet 1   atorvastatin (LIPITOR) 80 MG tablet TAKE 1 TABLET(80 MG) BY MOUTH DAILY 90 tablet 1   BIDIL 20-37.5 MG tablet TAKE 2 TABLETS BY MOUTH IN THE MORNING AND AT BEDTIME 120 tablet 6   clopidogrel (PLAVIX) 75 MG tablet Take 1 tablet (75 mg total) by mouth daily. 90 tablet 3   metFORMIN (GLUCOPHAGE) 500 MG tablet TAKE 2 TABLETS BY MOUTH EVERY MORNING WITH BREAKFAST THEN TAKE 1 TABLET BY MOUTH WITH DINNER 270 tablet 1   spironolactone (ALDACTONE) 25 MG tablet  TAKE 1 TABLET(25 MG) BY MOUTH DAILY 90 tablet 1   carvedilol (COREG) 12.5 MG tablet Take 1 tablet (12.5 mg total) by mouth 2 (two) times daily with a meal. 180 tablet 0   dapagliflozin propanediol (FARXIGA) 10 MG TABS tablet Take 1 tablet (10 mg total) by mouth daily. 90 tablet 0   tamsulosin (FLOMAX) 0.4 MG CAPS capsule Take 1 capsule (0.4 mg total) by mouth daily. 30 capsule 0   acetaminophen-codeine (TYLENOL #3) 300-30 MG tablet Take 1-2 tablets by mouth every 8 (eight) hours as needed for moderate pain. (Patient not taking: Reported on 05/24/2023) 30 tablet 1   cyclobenzaprine (FLEXERIL) 5 MG tablet Take 1 tablet (5 mg total) by mouth 3 (three) times daily as needed for muscle spasms. (Patient not taking: Reported on 05/24/2023) 60 tablet  2   No facility-administered medications prior to visit.    Allergies  Allergen Reactions   Benicar [Olmesartan] Anaphylaxis and Swelling    Patient cannot name the site of swelling (??)   Ace Inhibitors Swelling    Patient cannot name the site of swelling (??)   Angiotensin Receptor Blockers Swelling    Patient cannot name the site of swelling (??)       Objective:    BP (!) 162/85 (BP Location: Left Arm, Patient Position: Sitting, Cuff Size: Normal)   Pulse 87   Resp 19   Ht 6\' 2"  (1.88 m)   Wt 184 lb 6.4 oz (83.6 kg)   SpO2 98%   BMI 23.68 kg/m  Wt Readings from Last 3 Encounters:  05/24/23 184 lb 6.4 oz (83.6 kg)  05/16/23 160 lb (72.6 kg)  02/08/23 179 lb 12.8 oz (81.6 kg)    Physical Exam Vitals and nursing note reviewed.  Constitutional:      Appearance: He is well-developed.  HENT:     Head: Normocephalic and atraumatic.  Cardiovascular:     Rate and Rhythm: Normal rate and regular rhythm.     Heart sounds: Normal heart sounds. No murmur heard.    No friction rub. No gallop.  Pulmonary:     Effort: Pulmonary effort is normal. No tachypnea or respiratory distress.     Breath sounds: Normal breath sounds. No decreased breath  sounds, wheezing, rhonchi or rales.  Chest:     Chest wall: No tenderness.  Abdominal:     General: Bowel sounds are normal.     Palpations: Abdomen is soft.  Musculoskeletal:        General: Normal range of motion.     Cervical back: Normal range of motion.     Left knee: Bony tenderness present.  Skin:    General: Skin is warm and dry.  Neurological:     Mental Status: He is alert and oriented to person, place, and time.     Coordination: Coordination normal.  Psychiatric:        Behavior: Behavior normal. Behavior is cooperative.        Thought Content: Thought content normal.        Judgment: Judgment normal.          Patient has been counseled extensively about nutrition and exercise as well as the importance of adherence with medications and regular follow-up. The patient was given clear instructions to go to ER or return to medical center if symptoms don't improve, worsen or new problems develop. The patient verbalized understanding.   Follow-up: Return in about 3 months (around 08/22/2023).   Claiborne Rigg, FNP-BC [facility] [place], [place] [phone]   05/25/2023, 8:27 PM

## 2023-05-25 ENCOUNTER — Encounter: Payer: Self-pay | Admitting: Nurse Practitioner

## 2023-05-26 LAB — MICROALBUMIN / CREATININE URINE RATIO
Creatinine, Urine: 58 mg/dL
Microalb/Creat Ratio: 40 mg/g{creat} — ABNORMAL HIGH (ref 0–29)
Microalbumin, Urine: 23 ug/mL

## 2023-05-30 ENCOUNTER — Encounter: Payer: Self-pay | Admitting: Nurse Practitioner

## 2023-08-01 ENCOUNTER — Other Ambulatory Visit: Payer: Self-pay | Admitting: Physician Assistant

## 2023-08-01 DIAGNOSIS — E7800 Pure hypercholesterolemia, unspecified: Secondary | ICD-10-CM

## 2023-08-09 ENCOUNTER — Ambulatory Visit: Payer: Medicaid Other | Admitting: Nurse Practitioner

## 2023-08-22 ENCOUNTER — Ambulatory Visit: Payer: Medicaid Other | Attending: Nurse Practitioner | Admitting: Nurse Practitioner

## 2023-08-22 ENCOUNTER — Encounter: Payer: Self-pay | Admitting: Nurse Practitioner

## 2023-08-22 VITALS — BP 159/84 | HR 68 | Resp 20 | Ht 72.0 in | Wt 181.0 lb

## 2023-08-22 DIAGNOSIS — Z7984 Long term (current) use of oral hypoglycemic drugs: Secondary | ICD-10-CM

## 2023-08-22 DIAGNOSIS — E7800 Pure hypercholesterolemia, unspecified: Secondary | ICD-10-CM

## 2023-08-22 DIAGNOSIS — I10 Essential (primary) hypertension: Secondary | ICD-10-CM

## 2023-08-22 DIAGNOSIS — E119 Type 2 diabetes mellitus without complications: Secondary | ICD-10-CM | POA: Diagnosis not present

## 2023-08-22 LAB — POCT GLYCOSYLATED HEMOGLOBIN (HGB A1C): HbA1c, POC (controlled diabetic range): 6 % (ref 0.0–7.0)

## 2023-08-22 MED ORDER — AMLODIPINE BESYLATE 10 MG PO TABS: 10.0000 mg | ORAL_TABLET | Freq: Every day | ORAL | 1 refills | Status: DC

## 2023-08-22 NOTE — Progress Notes (Signed)
 Assessment & Plan:  Ronald Marsh was seen today for medical management of chronic issues.  Diagnoses and all orders for this visit:  Essential hypertension -     amLODipine (NORVASC) 10 MG tablet; Take 1 tablet (10 mg total) by mouth daily. Continue all antihypertensives as prescribed.  Reminded to bring in blood pressure log for follow  up appointment.  RECOMMENDATIONS: DASH/Mediterranean Diets are healthier choices for HTN.    Diabetes mellitus treated with oral medication (HCC) -     POCT glycosylated hemoglobin (Hb A1C) Continue blood sugar control as discussed in office today, low carbohydrate diet, and regular physical exercise as tolerated, 150 minutes per week (30 min each day, 5 days per week, or 50 min 3 days per week). Keep blood sugar logs with fasting goal of 90-130 mg/dl, post prandial (after you eat) less than 180.  For Hypoglycemia: BS <60 and Hyperglycemia BS >400; contact the clinic ASAP. Annual eye exams and foot exams are recommended.   Pure hypercholesterolemia INSTRUCTIONS: Work on a low fat, heart healthy diet and participate in regular aerobic exercise program by working out at least 150 minutes per week; 5 days a week-30 minutes per day. Avoid red meat/beef/steak,  fried foods. junk foods, sodas, sugary drinks, unhealthy snacking, alcohol and smoking.  Drink at least 80 oz of water per day and monitor your carbohydrate intake daily.      Patient has been counseled on age-appropriate routine health concerns for screening and prevention. These are reviewed and up-to-date. Referrals have been placed accordingly. Immunizations are up-to-date or declined.    Subjective:   Chief Complaint  Patient presents with   Medical Management of Chronic Issues    Ronald Marsh 63 y.o. male presents to office today for follow up to HTN  He has a past medical history of DM2, Heart failure, Hepatitis C, History of CVA (02/2010), History of depression, Hyperlipidemia,  Hypertension, Internal carotid artery stenosis, Stroke (2012), and Tobacco abuse.   HTN Blood pressure is elevated today. He has been out of his amlodipine for several weeks. He is still taking spironolactone 25 mg daily, BIDIL 20-37.5 mg as prescribed  and carvedilol 12.5 mg BID.  BP Readings from Last 3 Encounters:  08/22/23 (!) 159/84  05/24/23 (!) 162/85  05/16/23 125/83    DM 2 Well controlled and A1c at goal with Farxiga 10 mg daily and metformin 1000 mg in the am and 500 mg in the pm.  Lab Results  Component Value Date   HGBA1C 6.0 08/22/2023    Lab Results  Component Value Date   HGBA1C 6.4 02/08/2023    Review of Systems  Constitutional:  Negative for fever, malaise/fatigue and weight loss.  HENT: Negative.  Negative for nosebleeds.   Eyes: Negative.  Negative for blurred vision, double vision and photophobia.  Respiratory: Negative.  Negative for cough and shortness of breath.   Cardiovascular: Negative.  Negative for chest pain, palpitations and leg swelling.  Gastrointestinal: Negative.  Negative for heartburn, nausea and vomiting.  Musculoskeletal:  Positive for joint pain. Negative for myalgias.  Neurological: Negative.  Negative for dizziness, focal weakness, seizures and headaches.  Psychiatric/Behavioral: Negative.  Negative for suicidal ideas.     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   History  of depression    surrounding original stroke - has since resolved (04/2012)   Hyperlipidemia    Hypertension    Internal carotid artery stenosis    BL and mild per CT angiogram (05/2010)   Stroke (HCC) 2012   Tobacco abuse     Past Surgical History:  Procedure Laterality Date   NO PAST SURGERIES      Family History  Problem Relation  Age of Onset   Diabetes Mother        on dialysis   Heart failure Mother    Hypertension Mother    Hyperlipidemia Mother    Glaucoma Mother    Lung cancer Father    Diabetes Sister    Colon polyps Sister    Arthritis Sister    Hypertension Brother    Diabetes Brother    Pancreatic cancer Brother    Colon cancer Neg Hx    Esophageal cancer Neg Hx     Social History Reviewed with no changes to be made today.   Outpatient Medications Prior to Visit  Medication Sig Dispense Refill   atorvastatin (LIPITOR) 80 MG tablet TAKE 1 TABLET(80 MG) BY MOUTH DAILY 90 tablet 0   BIDIL 20-37.5 MG tablet TAKE 2 TABLETS BY MOUTH IN THE MORNING AND AT BEDTIME 120 tablet 6   carvedilol (COREG) 12.5 MG tablet Take 1 tablet (12.5 mg total) by mouth 2 (two) times daily with a meal. 180 tablet 1   clopidogrel (PLAVIX) 75 MG tablet Take 1 tablet (75 mg total) by mouth daily. 90 tablet 3   dapagliflozin propanediol (FARXIGA) 10 MG TABS tablet Take 1 tablet (10 mg total) by mouth daily. 90 tablet 1   metFORMIN (GLUCOPHAGE) 500 MG tablet TAKE 2 TABLETS BY MOUTH EVERY MORNING WITH BREAKFAST THEN TAKE 1 TABLET BY MOUTH WITH DINNER 270 tablet 1   spironolactone (ALDACTONE) 25 MG tablet TAKE 1 TABLET(25 MG) BY MOUTH DAILY 90 tablet 1   tamsulosin (FLOMAX) 0.4 MG CAPS capsule Take 1 capsule (0.4 mg total) by mouth daily. For urine stream 30 capsule 3   amLODipine (NORVASC) 10 MG tablet Take 1 tablet (10 mg total) by mouth daily. (Patient not taking: Reported on 08/22/2023) 90 tablet 1   No facility-administered medications prior to visit.    Allergies  Allergen Reactions   Benicar [Olmesartan] Anaphylaxis and Swelling    Patient cannot name the site of swelling (??)   Ace Inhibitors Swelling    Patient cannot name the site of swelling (??)   Angiotensin Receptor Blockers Swelling    Patient cannot name the site of swelling (??)       Objective:    BP (!) 159/84 (BP Location: Left Arm, Patient Position:  Sitting, Cuff Size: Normal)   Pulse 68   Resp 20   Ht 6' (1.829 m)   Wt 181 lb (82.1 kg)   SpO2 98%   BMI 24.55 kg/m  Wt Readings from Last 3 Encounters:  08/22/23 181 lb (82.1 kg)  05/24/23 184 lb 6.4 oz (83.6 kg)  05/16/23 160 lb (72.6 kg)    Physical Exam Vitals and nursing note reviewed.  Constitutional:      Appearance: He is well-developed.  HENT:     Head: Normocephalic and atraumatic.  Cardiovascular:     Rate and Rhythm: Normal rate and regular rhythm.     Heart sounds: Normal heart sounds. No murmur heard.    No friction rub. No gallop.  Pulmonary:     Effort:  Pulmonary effort is normal. No tachypnea or respiratory distress.     Breath sounds: Normal breath sounds. No decreased breath sounds, wheezing, rhonchi or rales.  Chest:     Chest wall: No tenderness.  Abdominal:     General: Bowel sounds are normal.     Palpations: Abdomen is soft.  Musculoskeletal:        General: Normal range of motion.     Cervical back: Normal range of motion.  Skin:    General: Skin is warm and dry.  Neurological:     Mental Status: He is alert and oriented to person, place, and time.     Coordination: Coordination normal.  Psychiatric:        Behavior: Behavior normal. Behavior is cooperative.        Thought Content: Thought content normal.        Judgment: Judgment normal.          Patient has been counseled extensively about nutrition and exercise as well as the importance of adherence with medications and regular follow-up. The patient was given clear instructions to go to ER or return to medical center if symptoms don't improve, worsen or new problems develop. The patient verbalized understanding.   Follow-up: Return in about 3 months (around 11/21/2023).   Collins Dean, FNP-BC [facility] [place], [place] [phone]   08/22/2023, 10:09 PM

## 2023-08-24 NOTE — Progress Notes (Signed)
 Patient identified by name and date of birth.  Patient has been informed and give information for imaging location again.

## 2023-09-13 ENCOUNTER — Telehealth (HOSPITAL_COMMUNITY): Payer: Self-pay | Admitting: Internal Medicine

## 2023-09-13 NOTE — Telephone Encounter (Signed)
 Called to confirm/remind patient of their appointment at the [facility] on 09/13/2023.   Appointment:   [x] Confirmed  [] Left mess   [] No answer/No voice mail  [] VM Full/unable to leave message  [] Phone not in service  Patient reminded to bring all medications and/or complete list.  Confirmed patient has transportation. Gave directions, instructed to utilize valet parking.

## 2023-09-14 ENCOUNTER — Ambulatory Visit (HOSPITAL_COMMUNITY)
Admission: RE | Admit: 2023-09-14 | Discharge: 2023-09-14 | Disposition: A | Discharge status: Home / Self Care | Source: Ambulatory Visit | Attending: Internal Medicine | Admitting: Internal Medicine

## 2023-09-14 ENCOUNTER — Encounter (HOSPITAL_COMMUNITY): Payer: Self-pay | Admitting: Internal Medicine

## 2023-09-14 VITALS — BP 106/70 | HR 78 | Ht 74.5 in | Wt 175.2 lb

## 2023-09-14 DIAGNOSIS — Z7902 Long term (current) use of antithrombotics/antiplatelets: Secondary | ICD-10-CM | POA: Diagnosis not present

## 2023-09-14 DIAGNOSIS — I10 Essential (primary) hypertension: Secondary | ICD-10-CM | POA: Diagnosis not present

## 2023-09-14 DIAGNOSIS — I110 Hypertensive heart disease with heart failure: Secondary | ICD-10-CM | POA: Diagnosis not present

## 2023-09-14 DIAGNOSIS — Z79899 Other long term (current) drug therapy: Secondary | ICD-10-CM | POA: Insufficient documentation

## 2023-09-14 DIAGNOSIS — Z7984 Long term (current) use of oral hypoglycemic drugs: Secondary | ICD-10-CM | POA: Diagnosis not present

## 2023-09-14 DIAGNOSIS — Z720 Tobacco use: Secondary | ICD-10-CM

## 2023-09-14 DIAGNOSIS — Z87891 Personal history of nicotine dependence: Secondary | ICD-10-CM | POA: Insufficient documentation

## 2023-09-14 DIAGNOSIS — E785 Hyperlipidemia, unspecified: Secondary | ICD-10-CM | POA: Insufficient documentation

## 2023-09-14 DIAGNOSIS — I5022 Chronic systolic (congestive) heart failure: Secondary | ICD-10-CM | POA: Diagnosis not present

## 2023-09-14 DIAGNOSIS — Z8673 Personal history of transient ischemic attack (TIA), and cerebral infarction without residual deficits: Secondary | ICD-10-CM | POA: Insufficient documentation

## 2023-09-14 DIAGNOSIS — E7800 Pure hypercholesterolemia, unspecified: Secondary | ICD-10-CM | POA: Diagnosis not present

## 2023-09-14 DIAGNOSIS — Z8619 Personal history of other infectious and parasitic diseases: Secondary | ICD-10-CM | POA: Diagnosis not present

## 2023-09-14 DIAGNOSIS — E119 Type 2 diabetes mellitus without complications: Secondary | ICD-10-CM | POA: Insufficient documentation

## 2023-09-14 LAB — BASIC METABOLIC PANEL WITH GFR
Anion gap: 8 (ref 5–15)
BUN: 13 mg/dL (ref 6–20)
CO2: 23 mmol/L (ref 22–32)
Calcium: 8.9 mg/dL (ref 8.9–10.3)
Chloride: 106 mmol/L (ref 98–111)
Creatinine, Ser: 0.9 mg/dL (ref 0.61–1.24)
GFR, Estimated: >60 mL/min (ref >60)
Glucose, Bld: 134 mg/dL — ABNORMAL HIGH (ref 70–99)
Potassium: 3.5 mmol/L (ref 3.5–5.1)
Sodium: 137 mmol/L (ref 135–145)

## 2023-09-14 LAB — BRAIN NATRIURETIC PEPTIDE: B Natriuretic Peptide: 9.9 pg/mL (ref 0.0–100.0)

## 2023-09-14 MED ORDER — ATORVASTATIN CALCIUM 80 MG PO TABS: ORAL_TABLET | ORAL | 3 refills | Status: AC

## 2023-09-14 NOTE — Addendum Note (Signed)
 Encounter addended by: Shukri Nistler B, RN on: 09/14/2023 9:54 AM  Actions taken: Visit diagnoses modified, Order list changed, Diagnosis association updated

## 2023-09-14 NOTE — Addendum Note (Signed)
 Encounter addended by: Decarlos Empey B, RN on: 09/14/2023 9:51 AM  Actions taken: Clinical Note Signed, Order list changed, Diagnosis association updated, Charge Capture section accepted

## 2023-09-14 NOTE — Patient Instructions (Signed)
 Medication Changes:  No Changes In Medications at this time.   Lab Work:  Labs done today, your results will be available in MyChart, we will contact you for abnormal readings.  Follow-Up in: 6 months with an ECHO PLEASE CALL OUR OFFICE AROUND September  TO GET SCHEDULED FOR YOUR APPOINTMENT. PHONE NUMBER IS [phone] OPTION 2    At the [facility], you and your health needs are our priority. We have a designated team specialized in the treatment of Heart Failure. This Care Team includes your primary Heart Failure Specialized Cardiologist (physician), Advanced Practice Providers (APPs- Physician Assistants and Nurse Practitioners), and Pharmacist who all work together to provide you with the care you need, when you need it.   You may see any of the following providers on your designated Care Team at your next follow up:  Dr. Jules Oar Dr. Peder Bourdon Dr. Alwin Baars Dr. Judyth Nunnery Nieves Bars, NP Ruddy Corral, [place] [facility] [place], [place] Dennise Fitz, NP [place] Lee, NP Luster Salters, PharmD   Please be sure to bring in all your medications bottles to every appointment.   Need to Contact Us :  If you have any questions or concerns before your next appointment please send us  a message through [place] or call our office at [phone].    TO LEAVE A MESSAGE FOR THE NURSE SELECT OPTION 2, PLEASE LEAVE A MESSAGE INCLUDING: YOUR NAME DATE OF BIRTH CALL BACK NUMBER REASON FOR CALL**this is important as we prioritize the call backs  YOU WILL RECEIVE A CALL BACK THE SAME DAY AS LONG AS YOU CALL BEFORE 4:00 PM

## 2023-09-14 NOTE — Progress Notes (Signed)
 ADV[facility]ED [facility] CLINIC NOTE  Referring Physician: Dr. Brent Cambric HF Cardiologist: DB  Chief complaint: HF  HPI:  Mr..Mortimer is a 63 y/o male with multiple medical problems including substance abuse (tobacco, ETOH cocaine), HCV (s/p treatment), HTN, HL, DM2, hx CVA in 2011 and h/o systolic HF dating back to 2011. Echo 02/2010 with EF 50-55%  Admitted to [facility] 9/19 with acute HF. EF 25-30%. Had cath. No CAD.   Admitted to [facility] 12/19 with recurrent HF and HTN. Echo 07/03/18 EF 25-30% Had angioedema with Benicar so not on ACE/ARNI/ARB  Echo 12/20 EF 40-45% RV ok Personally reviewed Echo 01/20/2020 EF 45-50% RV ok Echo 01/07/22: EF 50-55%, LV cavity trabeculated with inferior basal/apical hypokinesis, RV okay  cMRI 6/24 EF 53% RV ok. No LGE. Mild trabeculaltions (ratio 1.7 - criteria for LVNC >= 2.3)   Here today for f/u. Says he feels ok. Not doing that much. No CP or SOB. Can go to the store and do ADLs without too much problem. SOB with steps. No edema. Compliant with meds.   Denies any recent ETOH, tobacco or cocaine use.UDS 4/24 negative cocaine.   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   History of depression    surrounding original stroke - has since resolved (04/2012)   Hyperlipidemia    Hypertension    Internal carotid artery stenosis    BL and mild per CT angiogram (05/2010)   Stroke (HCC) 2012   Tobacco abuse     Current Outpatient Medications  Medication Sig Dispense Refill   amLODipine  (NORVASC ) 10 MG tablet Take 1 tablet (10 mg total) by mouth daily. 90 tablet 1   atorvastatin  (LIPITOR ) 80 MG tablet TAKE 1 TABLET(80 MG) BY MOUTH DAILY 90 tablet 0   BIDIL  20-37.5 MG tablet TAKE 2 TABLETS BY MOUTH IN THE MORNING AND AT BEDTIME 120 tablet 6    carvedilol  (COREG ) 12.5 MG tablet Take 1 tablet (12.5 mg total) by mouth 2 (two) times daily with a meal. 180 tablet 1   clopidogrel  (PLAVIX ) 75 MG tablet Take 1 tablet (75 mg total) by mouth daily. 90 tablet 3   dapagliflozin  propanediol (FARXIGA ) 10 MG TABS tablet Take 1 tablet (10 mg total) by mouth daily. 90 tablet 1   metFORMIN  (GLUCOPHAGE ) 500 MG tablet TAKE 2 TABLETS BY MOUTH EVERY MORNING WITH BREAKFAST THEN TAKE 1 TABLET BY MOUTH WITH DINNER 270 tablet 1   spironolactone  (ALDACTONE ) 25 MG tablet TAKE 1 TABLET(25 MG) BY MOUTH DAILY 90 tablet 1   tamsulosin  (FLOMAX ) 0.4 MG CAPS capsule Take 1 capsule (0.4 mg total) by mouth daily. For urine stream 30 capsule 3   No current facility-administered medications for this encounter.    Allergies  Allergen Reactions   Benicar [Olmesartan] Anaphylaxis and Swelling    Patient cannot name the site of swelling (??)   Ace Inhibitors Swelling    Patient cannot name the site of swelling (??)   Angiotensin Receptor Blockers Swelling    Patient cannot name the site of swelling (??)      Social History   Socioeconomic History   Marital status: Single    Spouse name: Not on file   Number of children: 1   Years of education: 11th grade   Highest education  level: Not on file  Occupational History   Occupation: On disability    Comment: previously worked for [employer] until his stroke in 2011.   Tobacco Use   Smoking status: Former    Current packs/day: 0.00    Average packs/day: 0.3 packs/day for 40.1 years (12.0 ttl pk-yrs)    Types: Cigarettes    Start date: 04/25/1981    Quit date: 05/27/2021    Years since quitting: 2.3   Smokeless tobacco: Never  Vaping Use   Vaping status: Never Used  Substance and Sexual Activity   Alcohol use: Not Currently   Drug use: Yes    Types: Marijuana    Comment: Former smoke crack    Sexual activity: Yes  Other Topics Concern   Not on file  Social History Narrative   Lives in [place] with  wife.   Has been incarcerated multiple times, last time was in 2012 for 8 months.   Social Drivers of Corporate investment banker Strain: Not on file  Food Insecurity: No Food Insecurity (01/22/2021)   Hunger Vital Sign    Worried About Running Out of Food in the Last Year: Never true    Ran Out of Food in the Last Year: Never true  Transportation Needs: Not on file  Physical Activity: Not on file  Stress: Not on file  Social Connections: Not on file  Intimate Partner Violence: Not on file      Family History  Problem Relation Age of Onset   Diabetes Mother        on dialysis   Heart failure Mother    Hypertension Mother    Hyperlipidemia Mother    Glaucoma Mother    Lung cancer Father    Diabetes Sister    Colon polyps Sister    Arthritis Sister    Hypertension Brother    Diabetes Brother    Pancreatic cancer Brother    Colon cancer Neg Hx    Esophageal cancer Neg Hx     Vitals:   09/14/23 0918  BP: 106/70  Pulse: 78  SpO2: 95%  Weight: 79.5 kg (175 lb 3.2 oz)  Height: 6' 2.5" (1.892 m)     PHYSICAL EXAM: General:  Well appearing. No resp difficulty HEENT: normal Neck: supple. no JVD. Carotids 2+ bilat; no bruits. No lymphadenopathy or thryomegaly appreciated. Cor: PMI nondisplaced. Regular rate & rhythm. No rubs, gallops or murmurs. Lungs: clear Abdomen: soft, nontender, nondistended. No hepatosplenomegaly. No bruits or masses. Good bowel sounds. Extremities: no cyanosis, clubbing, rash, edema Neuro: alert & orientedx3, cranial nerves grossly intact. moves all 4 extremities w/o difficulty. Affect pleasant   ASSESSMENT & PLAN:  1. Chronic systolic HF due to NICM - Echo 9/19 EF 25-30% - Echo 2/20 EF 25-30 - Cath 9/19 [facility]. No CAD - Echo 12/20 EF improved to 40-45% - ECHO 01/20/2020 EF 45-50%.  RV okay - Echo 09/23: EF 50-55%, LV cavity trabeculated, RV okay - cMRI 6/24 EF 53% RV ok. No LGE. Mild trabeculations (ratio 1.7 - criteria for LVNC >= 2.3)   - Previously suspected due to HTN and substance use - Doing well NYHA I - early II - Continue carvedilol  12.5 bid - Continue spiro 25 daily. - No ACE/ARB/ARNI with angioedema to Benicar in past - Continue Bidil  2 tabs tid - Continue Farxiga  10mg  daily - Denies further use of ETOH or cocaine - cMRI shows LV recovery and does not meet diagnostic criteria for LVNC. Continue to follow  -  Labs today - Encouraged him to be more active - F/u 6 months with echo to ensure stability   2. HTN - Blood pressure well controlled. Continue current regimen. - Labs today  3. Substance abuse - Denies ETOH/cocaine. Occasional THC use.  - Congratulated him on remaining quit  4. CVA - On DAPT + statin.  - Suspect cardio-embolic in setting of previous low EF. cMRI does not meet criteria for LVNC and EF has recovered so will not switch to DOAC  - Minimal residual deficit  - No change  5. HCV - Treated. last VL undetectable    Jules Oar, MD  9:41 AM

## 2023-09-18 ENCOUNTER — Telehealth (HOSPITAL_COMMUNITY): Payer: Self-pay | Admitting: Cardiology

## 2023-09-18 DIAGNOSIS — I5022 Chronic systolic (congestive) heart failure: Secondary | ICD-10-CM

## 2023-09-18 MED ORDER — POTASSIUM CHLORIDE CRYS ER 20 MEQ PO TBCR: 20.0000 meq | EXTENDED_RELEASE_TABLET | Freq: Every day | ORAL | 3 refills | Status: AC

## 2023-09-18 NOTE — Telephone Encounter (Signed)
-----   Message from Jules Oar sent at 09/17/2023  4:19 PM EDT ----- Add kdur 20 daily. Repeat bmet in 1 week,

## 2023-09-18 NOTE — Telephone Encounter (Signed)
 Patient called.  Patient aware.

## 2023-09-25 ENCOUNTER — Ambulatory Visit (HOSPITAL_COMMUNITY)
Admission: RE | Admit: 2023-09-25 | Discharge: 2023-09-25 | Disposition: A | Discharge status: Home / Self Care | Source: Ambulatory Visit | Attending: Internal Medicine | Admitting: Internal Medicine

## 2023-09-25 DIAGNOSIS — I5022 Chronic systolic (congestive) heart failure: Secondary | ICD-10-CM | POA: Diagnosis present

## 2023-09-25 LAB — BASIC METABOLIC PANEL WITH GFR
Anion gap: 11 (ref 5–15)
BUN: 13 mg/dL (ref 6–20)
CO2: 23 mmol/L (ref 22–32)
Calcium: 9.4 mg/dL (ref 8.9–10.3)
Chloride: 107 mmol/L (ref 98–111)
Creatinine, Ser: 0.89 mg/dL (ref 0.61–1.24)
GFR, Estimated: >60 mL/min (ref >60)
Glucose, Bld: 152 mg/dL — ABNORMAL HIGH (ref 70–99)
Potassium: 3.5 mmol/L (ref 3.5–5.1)
Sodium: 141 mmol/L (ref 135–145)

## 2023-10-11 ENCOUNTER — Telehealth: Payer: Self-pay

## 2023-10-11 ENCOUNTER — Other Ambulatory Visit: Payer: Self-pay | Admitting: Physician Assistant

## 2023-10-11 DIAGNOSIS — E1142 Type 2 diabetes mellitus with diabetic polyneuropathy: Secondary | ICD-10-CM

## 2023-10-11 NOTE — Telephone Encounter (Signed)
 Pharmacy Patient Advocate Encounter   Received notification from CoverMyMeds that prior authorization for FARXIGA  is required/requested.   Insurance verification completed.   The patient is insured through [facility] MEDICAID .   Per test claim: PA required; PA submitted to above mentioned insurance via CoverMyMeds Key/confirmation #/EOC [id number] Status is pending

## 2023-10-14 ENCOUNTER — Other Ambulatory Visit: Payer: Self-pay | Admitting: Physician Assistant

## 2023-10-14 DIAGNOSIS — E1142 Type 2 diabetes mellitus with diabetic polyneuropathy: Secondary | ICD-10-CM

## 2023-10-18 ENCOUNTER — Other Ambulatory Visit: Payer: Self-pay | Admitting: Nurse Practitioner

## 2023-10-18 DIAGNOSIS — E1142 Type 2 diabetes mellitus with diabetic polyneuropathy: Secondary | ICD-10-CM

## 2023-10-18 DIAGNOSIS — I5033 Acute on chronic diastolic (congestive) heart failure: Secondary | ICD-10-CM

## 2023-10-18 NOTE — Telephone Encounter (Signed)
 Copied from CRM [id number]. Topic: Clinical - Medication Refill >> Oct 18, 2023  9:23 AM Adaline Holly wrote: Medication: metFORMIN  (GLUCOPHAGE ) 500 MG tablet  Has the patient contacted their pharmacy? Yes Rx was sent on 6/4. Pharmacy told patient they never received Rx.  This is the patient's preferred pharmacy:  [facility] [phone] - [place], [place] - [address] AT [facility] [address] [place] [address] Phone: [phone] Fax: [phone]  Is this the correct pharmacy for this prescription? Yes

## 2023-10-18 NOTE — Telephone Encounter (Signed)
 Copied from CRM [id number]. Topic: Clinical - Medication Refill >> Oct 18, 2023  9:08 AM Crispin Dolphin wrote: Medication: spironolactone  (ALDACTONE ) 25 MG tablet  Has the patient contacted their pharmacy? Yes (Agent: If no, request that the patient contact the pharmacy for the refill. If patient does not wish to contact the pharmacy document the reason why and proceed with request.) (Agent: If yes, when and what did the pharmacy advise?)  This is the patient's preferred pharmacy:  [facility] [phone] - [place], [place] - [address] AT [facility] [address] [place] [address] Phone: [phone] Fax: [phone]  Is this the correct pharmacy for this prescription? Yes If no, delete pharmacy and type the correct one.   Has the prescription been filled recently? No  Is the patient out of the medication? No - 2 more   Has the patient been seen for an appointment in the last year OR does the patient have an upcoming appointment? Yes  Can we respond through MyChart? No  Agent: Please be advised that Rx refills may take up to 3 business days. We ask that you follow-up with your pharmacy.

## 2023-10-19 MED ORDER — SPIRONOLACTONE 25 MG PO TABS: ORAL_TABLET | ORAL | 1 refills | Status: DC

## 2023-10-19 NOTE — Telephone Encounter (Signed)
 Requested Prescriptions  Refused Prescriptions Disp Refills   metFORMIN  (GLUCOPHAGE ) 500 MG tablet 270 tablet 1    Sig: TAKE 2 TABLETS BY MOUTH EVERY MORNING WITH BREAKFAST THEN TAKE 1 TABLET BY MOUTH WITH DINNER     Endocrinology:  Diabetes - Biguanides Failed - 10/19/2023  2:38 PM      Failed - B12 Level in normal range and within 720 days    No results found for: VITAMINB12       Failed - CBC within normal limits and completed in the last 12 months    WBC  Date Value Ref Range Status  05/16/2023 5.5 4.0 - 10.5 K/uL Final   RBC  Date Value Ref Range Status  05/16/2023 4.44 4.22 - 5.81 MIL/uL Final   Hemoglobin  Date Value Ref Range Status  05/16/2023 13.7 13.0 - 17.0 g/dL Final  78/29/5621 30.8 13.0 - 17.7 g/dL Final   HCT  Date Value Ref Range Status  05/16/2023 40.8 39.0 - 52.0 % Final   Hematocrit  Date Value Ref Range Status  09/06/2022 40.7 37.5 - 51.0 % Final   MCHC  Date Value Ref Range Status  05/16/2023 33.6 30.0 - 36.0 g/dL Final   [facility]  Date Value Ref Range Status  05/16/2023 30.9 26.0 - 34.0 pg Final   MCV  Date Value Ref Range Status  05/16/2023 91.9 80.0 - 100.0 fL Final  09/06/2022 92 79 - 97 fL Final   No results found for: PLTCOUNTKUC, LABPLAT, POCPLA RDW  Date Value Ref Range Status  05/16/2023 13.7 11.5 - 15.5 % Final  09/06/2022 13.1 11.6 - 15.4 % Final         Passed - Cr in normal range and within 360 days    Creat  Date Value Ref Range Status  10/22/2018 0.99 0.70 - 1.33 mg/dL Final    Comment:    For patients >19 years of age, the reference limit for Creatinine is approximately 13% higher for people identified as African-American. .    Creatinine, Ser  Date Value Ref Range Status  09/25/2023 0.89 0.61 - 1.24 mg/dL Final   Creatinine,U  Date Value Ref Range Status  02/27/2010  mg/dL Final   65.7 (NOTE)  Cutoff Values for Urine Drug Screen:        Drug Class           Cutoff (ng/mL)        Amphetamines             1000        Barbiturates             200        Cocaine Metabolites      300        Benzodiazepines          200        Methadone                 300        Opiates                 2000        Phencyclidine             25        Propoxyphene             300        Marijuana Metabolites     50  For medical purposes only.   Creatinine, Urine  Date Value Ref Range Status  02/25/2014 288.4 mg/dL Final    Comment:    No reference range established.         Passed - HBA1C is between 0 and 7.9 and within 180 days    HbA1c, POC (controlled diabetic range)  Date Value Ref Range Status  08/22/2023 6.0 0.0 - 7.0 % Final         Passed - eGFR in normal range and within 360 days    GFR, Est African American  Date Value Ref Range Status  10/22/2018 99 > OR = 60 mL/min/1.47m2 Final   GFR calc Af Amer  Date Value Ref Range Status  05/20/2020 114 >59 mL/min/1.73 Final    Comment:    **In accordance with recommendations from the NKF-ASN Task force,**   [facility] is in the process of updating its eGFR calculation to the   2021 CKD-EPI creatinine equation that estimates kidney function   without a race variable.    GFR, Est Non African American  Date Value Ref Range Status  10/22/2018 85 > OR = 60 mL/min/1.37m2 Final   GFR, Estimated  Date Value Ref Range Status  09/25/2023 >60 >60 mL/min Final    Comment:    (NOTE) Calculated using the CKD-EPI Creatinine Equation (2021)    eGFR  Date Value Ref Range Status  09/06/2022 93 >59 mL/min/1.73 Final         Passed - Valid encounter within last 6 months    Recent Outpatient Visits           1 month ago Essential hypertension   [place] [facility] [place] - A Dept Of [place]. [facility] Collins Dean, NP   4 months ago Hospital discharge follow-up   [facility] [place] - A Dept Of [place]. [facility] [place], [place] W, NP   8 months ago Type 2 diabetes mellitus with hyperglycemia, without long-term  current use of insulin  [facility])   [place] [facility] [place] Grout - A Dept Of [place]. [facility] [place], [place], [place]   1 year ago Controlled type 2 diabetes mellitus with diabetic polyneuropathy, without long-term current use of insulin  [facility])   [place] [facility] Grout - A Dept Of [place]. [facility] Collins Dean, NP   1 year ago Essential hypertension   [place] [facility] [place] - A Dept Of [place]. [facility] Collins Dean, [place]

## 2023-10-19 NOTE — Telephone Encounter (Signed)
 Requested Prescriptions  Pending Prescriptions Disp Refills   spironolactone  (ALDACTONE ) 25 MG tablet 90 tablet 1    Sig: TAKE 1 TABLET(25 MG) BY MOUTH DAILY     Cardiovascular: Diuretics - Aldosterone Antagonist Passed - 10/19/2023  2:24 PM      Passed - Cr in normal range and within 180 days    Creat  Date Value Ref Range Status  10/22/2018 0.99 0.70 - 1.33 mg/dL Final    Comment:    For patients >61 years of age, the reference limit for Creatinine is approximately 13% higher for people identified as African-American. .    Creatinine, Ser  Date Value Ref Range Status  09/25/2023 0.89 0.61 - 1.24 mg/dL Final   Creatinine,U  Date Value Ref Range Status  02/27/2010  mg/dL Final   09.8 (NOTE)  Cutoff Values for Urine Drug Screen:        Drug Class           Cutoff (ng/mL)        Amphetamines            1000        Barbiturates             200        Cocaine Metabolites      300        Benzodiazepines          200        Methadone                 300        Opiates                 2000        Phencyclidine             25        Propoxyphene             300        Marijuana Metabolites     50  For medical purposes only.   Creatinine, Urine  Date Value Ref Range Status  02/25/2014 288.4 mg/dL Final    Comment:    No reference range established.         Passed - K in normal range and within 180 days    Potassium  Date Value Ref Range Status  09/25/2023 3.5 3.5 - 5.1 mmol/L Final         Passed - Na in normal range and within 180 days    Sodium  Date Value Ref Range Status  09/25/2023 141 135 - 145 mmol/L Final  09/06/2022 143 134 - 144 mmol/L Final         Passed - eGFR is 30 or above and within 180 days    GFR, Est African American  Date Value Ref Range Status  10/22/2018 99 > OR = 60 mL/min/1.56m2 Final   GFR calc Af Amer  Date Value Ref Range Status  05/20/2020 114 >59 mL/min/1.73 Final    Comment:    **In accordance with recommendations from the NKF-ASN Task  force,**   [facility] is in the process of updating its eGFR calculation to the   2021 CKD-EPI creatinine equation that estimates kidney function   without a race variable.    GFR, Est Non African American  Date Value Ref Range Status  10/22/2018 85 > OR = 60 mL/min/1.16m2 Final   GFR, Estimated  Date Value Ref Range Status  09/25/2023 >60 >60 mL/min Final    Comment:    (NOTE) Calculated using the CKD-EPI Creatinine Equation (2021)    eGFR  Date Value Ref Range Status  09/06/2022 93 >59 mL/min/1.73 Final         Passed - Last BP in normal range    BP Readings from Last 1 Encounters:  09/14/23 106/70         Passed - Valid encounter within last 6 months    Recent Outpatient Visits           1 month ago Essential hypertension   [place] [facility] [place] - A Dept Of [place]. [facility] Collins Dean, NP   4 months ago Hospital discharge follow-up   [facility] [place] - A Dept Of [place]. [facility] [place], [place] W, NP   8 months ago Type 2 diabetes mellitus with hyperglycemia, without long-term current use of insulin  [facility])   [place] [facility] Vivien Grout - A Dept Of [place]. [facility] [place], [place], [place]   1 year ago Controlled type 2 diabetes mellitus with diabetic polyneuropathy, without long-term current use of insulin  [facility])   [place] [facility] Vivien Grout - A Dept Of [place]. [facility] Collins Dean, NP   1 year ago Essential hypertension   [place] [facility] [place] - A Dept Of . [facility] Collins Dean, [place]

## 2023-10-22 ENCOUNTER — Other Ambulatory Visit: Payer: Self-pay | Admitting: Physician Assistant

## 2023-10-22 DIAGNOSIS — E1142 Type 2 diabetes mellitus with diabetic polyneuropathy: Secondary | ICD-10-CM

## 2023-10-23 ENCOUNTER — Telehealth: Payer: Self-pay

## 2023-10-23 NOTE — Telephone Encounter (Signed)
 Return call unanswered. Refill sent 6/4 for.

## 2023-10-23 NOTE — Telephone Encounter (Signed)
 Copied from CRM [id number]. Topic: Clinical - Prescription Issue >> Oct 23, 2023  1:14 PM Chrystal Crape R wrote: Pt would like to know the status of his rx  metFORMIN  (GLUCOPHAGE ) 500 MG tablet As he's been submitting refills for some time

## 2023-10-26 NOTE — Telephone Encounter (Signed)
 Copied from CRM [id number]. Topic: Clinical - Prescription Issue >> Oct 26, 2023  3:36 PM [name] wrote:  Reason for CRM: Pt would like to know the status of his rx  metFORMIN  (GLUCOPHAGE ) 500 MG tablet Says he's been calling constantly with no update and he is currently out of this medication .

## 2023-10-27 ENCOUNTER — Ambulatory Visit: Payer: Self-pay

## 2023-10-27 DIAGNOSIS — E1142 Type 2 diabetes mellitus with diabetic polyneuropathy: Secondary | ICD-10-CM

## 2023-10-27 NOTE — Telephone Encounter (Signed)
 Unable to reach patient by phone. Detailed message left on voicemail.  If patient calls back please refer to previous message.  Patient will need to reach out to the pharmacy.

## 2023-10-27 NOTE — Telephone Encounter (Signed)
 FYI Only or Action Required?: Action required by provider: medication refill request.  Patient was last seen in primary care on 08/22/2023 by Collins Dean, NP. Called Nurse Triage reporting Medication Refill. Symptoms began today. Interventions attempted: Other: Needs Metformin  RX. Resent to pharmacy. Symptoms are: N/A.  Triage Disposition: Call PCP Now  Patient/caregiver understands and will follow disposition?: No, wishes to speak with PCP  **See note Below**                      Reason for Disposition  [1] Prescription refill request for ESSENTIAL medicine (i.e., likelihood of harm to patient if not taken) AND [2] triager unable to refill per department policy  Answer Assessment - Initial Assessment Questions 1. DRUG NAME: What medicine do you need to have refilled?   Patient says Metformin  prescription was not received by pharmacy. He states he is out of the medication for a couple of days, however the pharmacy still states they have not received the prescription. I verified the pharmacy with the patient and that is where the prescription was sent.  Protocols used: Medication Refill and Renewal Call-A-AH

## 2023-10-30 NOTE — Telephone Encounter (Signed)
 Copied from CRM [id number]. Topic: Clinical - Prescription Issue >> Oct 27, 2023  3:25 PM Tiffini S wrote:  Reason for CRM: Patient was returning a missed called from earlier. Per note advise patient to please reach out to the pharmacy for medication refill for metFORMIN  (GLUCOPHAGE ) 500 MG tablet.  >> Oct 30, 2023  1:35 PM Montie POUR wrote: Ronald Marsh is still waiting for a refill for above medication. Chart shows is was not sent to his pharmacy. He has been waiting for refill since 10/18/23. He is out of his medication  >> Oct 27, 2023  3:40 PM Montie POUR wrote: I call and transferred to triage nurse about the above since he has been out for a week and pharmacy said they did not receive anything. Chart shows it was sent. Triage nurse is going to send another note about this issue to clinic

## 2023-10-30 NOTE — Telephone Encounter (Signed)
 Call to  patient to make are that medication was sent to [facility] on [address] on 10/11/2023. Unable to reach VM left.

## 2023-10-30 NOTE — Telephone Encounter (Unsigned)
 Copied from CRM [id number]. Topic: Clinical - Medication Refill >> Oct 30, 2023  1:38 PM Montie POUR wrote: Medication: carvedilol  (COREG ) 12.5 MG tablet  Has the patient contacted their pharmacy? Yes (Agent: If no, request that the patient contact the pharmacy for the refill. If patient does not wish to contact the pharmacy document the reason why and proceed with request.) (Agent: If yes, when and what did the pharmacy advise?) Pharmacy needs order to refill  This is the patient's preferred pharmacy:  [facility] [phone] - [place], [place] - [address] AT [facility] [address] [place] [address] Phone: [phone] Fax: [phone]  Is this the correct pharmacy for this prescription? Yes If no, delete pharmacy and type the correct one.   Has the prescription been filled recently? No  Is the patient out of the medication? No  Has the patient been seen for an appointment in the last year OR does the patient have an upcoming appointment? Yes  Can we respond through MyChart? No  Agent: Please be advised that Rx refills may take up to 3 business days. We ask that you follow-up with your pharmacy.

## 2023-10-31 ENCOUNTER — Telehealth: Payer: Self-pay | Admitting: Nurse Practitioner

## 2023-10-31 ENCOUNTER — Other Ambulatory Visit: Payer: Self-pay

## 2023-10-31 ENCOUNTER — Telehealth: Payer: Self-pay

## 2023-10-31 DIAGNOSIS — E1142 Type 2 diabetes mellitus with diabetic polyneuropathy: Secondary | ICD-10-CM

## 2023-10-31 MED ORDER — METFORMIN HCL 500 MG PO TABS: ORAL_TABLET | ORAL | 1 refills | Status: DC

## 2023-10-31 NOTE — Telephone Encounter (Addendum)
 Called patient, but there was no answer. A detailed voicemail was left informing the patient that their metformin  prescription has been [facility] to the [facility] located at   [address] [place], [place] [address]

## 2023-10-31 NOTE — Telephone Encounter (Signed)
 Sent today and confirmed receipt

## 2023-10-31 NOTE — Telephone Encounter (Signed)
 Call received for patient . Patient voiced pharmacy states that his metFORMIN  has not been sent for refill. Call to pharmacy spoke with [facility] pharmacist ,advised that metformin  was sent for refill on 10/11/2023. Roselynn said she has not received anything for this patient . metformin  resent today.

## 2023-10-31 NOTE — Telephone Encounter (Signed)
 Copied from CRM [id number]. Topic: Clinical - Medication Refill >> Oct 31, 2023  3:28 PM Turkey B wrote: Medication:  metFORMIN  (GLUCOPHAGE ) 500 MG tablet 3rd request, pt states med is still not at pharmacy  Has the patient contacted their pharmacy? yes (Agent: If yes, when and what did the pharmacy advise?) Contact pcp  This is the patient's preferred pharmacy:  [facility] [phone] - [place], [place] - [address] AT [facility] OF [address] [place] [address] Phone: [phone] Fax: [phone]  Is this the correct pharmacy for this prescription? yes If no, delete pharmacy and type the correct one.   Has the prescription been filled recently? no  Is the patient out of the medication? yes  Has the patient been seen for an appointment in the last year OR does the patient have an upcoming appointment? yes  Can we respond through MyChart? yes  Agent: Please be advised that Rx refills may take up to 3 business days. We ask that you follow-up with your pharmacy.

## 2023-11-03 ENCOUNTER — Other Ambulatory Visit: Payer: Self-pay | Admitting: Nurse Practitioner

## 2023-11-03 DIAGNOSIS — I5033 Acute on chronic diastolic (congestive) heart failure: Secondary | ICD-10-CM

## 2023-11-03 DIAGNOSIS — E7800 Pure hypercholesterolemia, unspecified: Secondary | ICD-10-CM

## 2023-11-03 NOTE — Telephone Encounter (Unsigned)
 Copied from CRM [id number]. Topic: Clinical - Medication Refill >> Nov 03, 2023  1:19 PM Donee H wrote: Medication: atorvastatin  (LIPITOR ) 80 MG tablet  BIDIL  20-37.5 MG tablet  Has the patient contacted their pharmacy? Yes (Agent: If no, request that the patient contact the pharmacy for the refill. If patient does not wish to contact the pharmacy document the reason why and proceed with request.) (Agent: If yes, when and what did the pharmacy advise?)  This is the patient's preferred pharmacy:  [facility] [phone] - [place], [place] - [address] AT [facility] [address] [place] [address] Phone: [phone] Fax: [phone]  Is this the correct pharmacy for this prescription? Yes If no, delete pharmacy and type the correct one.   Has the prescription been filled recently? No  Is the patient out of the medication? No but stated almost out of both medications  Has the patient been seen for an appointment in the last year OR does the patient have an upcoming appointment? Yes  Can we respond through MyChart? Yes  Agent: Please be advised that Rx refills may take up to 3 business days. We ask that you follow-up with your pharmacy.

## 2023-11-04 ENCOUNTER — Other Ambulatory Visit: Payer: Self-pay | Admitting: Physician Assistant

## 2023-11-04 DIAGNOSIS — I5033 Acute on chronic diastolic (congestive) heart failure: Secondary | ICD-10-CM

## 2023-11-06 MED ORDER — BIDIL 20-37.5 MG PO TABS: ORAL_TABLET | ORAL | 0 refills | Status: DC

## 2023-11-06 NOTE — Telephone Encounter (Signed)
 Requested Prescriptions  Pending Prescriptions Disp Refills   atorvastatin  (LIPITOR ) 80 MG tablet 90 tablet 3    Sig: TAKE 1 TABLET(80 MG) BY MOUTH DAILY     Cardiovascular:  Antilipid - Statins Failed - 11/06/2023  1:42 PM      Failed - Lipid Panel in normal range within the last 12 months    Cholesterol, Total  Date Value Ref Range Status  09/20/2021 110 100 - 199 mg/dL Final   LDL Chol Calc (NIH)  Date Value Ref Range Status  09/20/2021 61 0 - 99 mg/dL Final   HDL  Date Value Ref Range Status  09/20/2021 36 (L) >39 mg/dL Final   Triglycerides  Date Value Ref Range Status  09/20/2021 59 0 - 149 mg/dL Final         Passed - Patient is not pregnant      Passed - Valid encounter within last 12 months    Recent Outpatient Visits           2 months ago Essential hypertension   [place] [facility] [place] - A Dept Of [place]. [facility] Theotis Haze ORN, NP   5 months ago Hospital discharge follow-up   [facility] [place] - A Dept Of [place]. [facility] [place], [place] W, NP   9 months ago Type 2 diabetes mellitus with hyperglycemia, without long-term current use of insulin  [facility])   [place] [facility] [place] - A Dept Of [place]. [facility] [place], [place], [place]   1 year ago Controlled type 2 diabetes mellitus with diabetic polyneuropathy, without long-term current use of insulin  [facility])   [place] [facility] [place] - A Dept Of [place]. [facility] Theotis Haze ORN, NP   1 year ago Essential hypertension   [place] [facility] [place] - A Dept Of [place]. [facility] [place], [place] W, NP               BIDIL  20-37.5 MG tablet 120 tablet 0    Sig: TAKE 2 TABLETS BY MOUTH IN THE MORNING AND AT BEDTIME     Cardiovascular:  Vasodilators Failed - 11/06/2023  1:42 PM      Failed - ANA Screen, Ifa, Serum in normal range and within 360 days    No results found for: ANA, ANATITER, LABANTI        Passed - HCT in normal range and within 360 days    HCT  Date Value Ref Range Status  05/16/2023 40.8 39.0 - 52.0 % Final   Hematocrit  Date Value Ref Range Status  09/06/2022 40.7 37.5 - 51.0 % Final         Passed - HGB in normal range and within 360 days    Hemoglobin  Date Value Ref Range Status  05/16/2023 13.7 13.0 - 17.0 g/dL Final  95/69/7975 86.4 13.0 - 17.7 g/dL Final         Passed - RBC in normal range and within 360 days    RBC  Date Value Ref Range Status  05/16/2023 4.44 4.22 - 5.81 MIL/uL Final         Passed - WBC in normal range and within 360 days    WBC  Date Value Ref Range Status  05/16/2023 5.5 4.0 - 10.5 K/uL Final         Passed - PLT in normal range and within 360 days    Platelets  Date Value Ref Range Status  05/16/2023 295 150 - 400 K/uL Final  09/06/2022 233 150 - 450 x10E3/uL Final         Passed - Last BP in normal range    BP Readings from Last 1 Encounters:  09/14/23 106/70         Passed - Valid encounter within last 12 months    Recent Outpatient Visits           2 months ago Essential hypertension   [place] [facility] [place] - A Dept Of [place]. [facility] Theotis Haze ORN, NP   5 months ago Hospital discharge follow-up   [facility] [place] - A Dept Of [place]. [facility] [place], [place] W, NP   9 months ago Type 2 diabetes mellitus with hyperglycemia, without long-term current use of insulin  [facility])   [place] [facility] - A Dept Of [place]. [facility] [place], [place], [place]   1 year ago Controlled type 2 diabetes mellitus with diabetic polyneuropathy, without long-term current use of insulin  [facility])   [place] [facility] - A Dept Of [place]. [facility] Theotis Haze ORN, NP   1 year ago Essential hypertension   [place] [facility] [place] - A Dept Of [place]. [facility] Theotis Haze ORN, [place]

## 2023-11-06 NOTE — Telephone Encounter (Signed)
 Requested Prescriptions  Pending Prescriptions Disp Refills   atorvastatin  (LIPITOR ) 80 MG tablet 90 tablet 3    Sig: TAKE 1 TABLET(80 MG) BY MOUTH DAILY     Cardiovascular:  Antilipid - Statins Failed - 11/06/2023  1:42 PM      Failed - Lipid Panel in normal range within the last 12 months    Cholesterol, Total  Date Value Ref Range Status  09/20/2021 110 100 - 199 mg/dL Final   LDL Chol Calc (NIH)  Date Value Ref Range Status  09/20/2021 61 0 - 99 mg/dL Final   HDL  Date Value Ref Range Status  09/20/2021 36 (L) >39 mg/dL Final   Triglycerides  Date Value Ref Range Status  09/20/2021 59 0 - 149 mg/dL Final         Passed - Patient is not pregnant      Passed - Valid encounter within last 12 months    Recent Outpatient Visits           2 months ago Essential hypertension   [place] [facility] [place] - A Dept Of [place]. [facility] Theotis Haze ORN, NP   5 months ago Hospital discharge follow-up   [facility] [place] - A Dept Of [place]. [facility] [place], [place] W, NP   9 months ago Type 2 diabetes mellitus with hyperglycemia, without long-term current use of insulin  [facility])   [place] [facility] - A Dept Of [place]. [facility] [place], [place], [place]   1 year ago Controlled type 2 diabetes mellitus with diabetic polyneuropathy, without long-term current use of insulin  [facility])   [place] [facility] - A Dept Of [place]. [facility] Theotis Haze ORN, NP   1 year ago Essential hypertension   [place] [facility] [place] - A Dept Of . [facility] Theotis Haze ORN, NP              Signed Prescriptions Disp Refills   BIDIL  20-37.5 MG tablet 120 tablet 0    Sig: TAKE 2 TABLETS BY MOUTH IN THE MORNING AND AT BEDTIME     Cardiovascular:  Vasodilators Failed - 11/06/2023  1:42 PM      Failed - ANA Screen, Ifa, Serum in normal range and within 360 days    No results  found for: ANA, ANATITER, LABANTI       Passed - HCT in normal range and within 360 days    HCT  Date Value Ref Range Status  05/16/2023 40.8 39.0 - 52.0 % Final   Hematocrit  Date Value Ref Range Status  09/06/2022 40.7 37.5 - 51.0 % Final         Passed - HGB in normal range and within 360 days    Hemoglobin  Date Value Ref Range Status  05/16/2023 13.7 13.0 - 17.0 g/dL Final  95/69/7975 86.4 13.0 - 17.7 g/dL Final         Passed - RBC in normal range and within 360 days    RBC  Date Value Ref Range Status  05/16/2023 4.44 4.22 - 5.81 MIL/uL Final         Passed - WBC in normal range and within 360 days    WBC  Date Value Ref Range Status  05/16/2023 5.5 4.0 - 10.5 K/uL Final         Passed - PLT in normal range and within 360  days    Platelets  Date Value Ref Range Status  05/16/2023 295 150 - 400 K/uL Final  09/06/2022 233 150 - 450 x10E3/uL Final         Passed - Last BP in normal range    BP Readings from Last 1 Encounters:  09/14/23 106/70         Passed - Valid encounter within last 12 months    Recent Outpatient Visits           2 months ago Essential hypertension   [facility] [place] - A Dept Of [place]. [facility] Theotis Haze ORN, NP   5 months ago Hospital discharge follow-up   [facility] [place] - A Dept Of [place]. [facility] [place], [place] W, NP   9 months ago Type 2 diabetes mellitus with hyperglycemia, without long-term current use of insulin  [facility])   [place] [facility] [place] - A Dept Of [place]. [facility] [place], [place], [place]   1 year ago Controlled type 2 diabetes mellitus with diabetic polyneuropathy, without long-term current use of insulin  [facility])   [place] [facility] [place] - A Dept Of [place]. [facility] Theotis Haze ORN, NP   1 year ago Essential hypertension    [facility] [place] - A Dept Of . [facility]  Theotis Haze ORN, [place]

## 2023-11-20 ENCOUNTER — Telehealth: Payer: Self-pay | Admitting: Nurse Practitioner

## 2023-11-20 NOTE — Telephone Encounter (Signed)
 Called pt to confirm appt. Pt will be present.

## 2023-11-21 ENCOUNTER — Ambulatory Visit: Attending: Nurse Practitioner | Admitting: Nurse Practitioner

## 2023-11-21 ENCOUNTER — Encounter: Payer: Self-pay | Admitting: Nurse Practitioner

## 2023-11-21 VITALS — BP 139/76 | HR 71 | Resp 19 | Ht 74.5 in | Wt 166.0 lb

## 2023-11-21 DIAGNOSIS — I10 Essential (primary) hypertension: Secondary | ICD-10-CM | POA: Diagnosis not present

## 2023-11-21 DIAGNOSIS — R634 Abnormal weight loss: Secondary | ICD-10-CM | POA: Diagnosis not present

## 2023-11-21 NOTE — Progress Notes (Unsigned)
 Assessment & Plan:  Ronald Marsh was seen today for hypertension.  Diagnoses and all orders for this visit:  Primary hypertension Blood pressure at goal.   Advised to avoid outdoor activities during peak heat hours.  Unintentional weight loss -     HIV antibody (with reflex) -     Thyroid  Panel With TSH -     VITAMIN D  25 Hydroxy (Vit-D Deficiency, Fractures) -     CBC with Differential -     Sedimentation Rate -     C-reactive protein -     Hepatitis C antibody Weight loss of 9 pounds over 8 weeks, likely related to decreased appetite from heat exposure. Other causes such as thyroid  dysfunction, anemia, or vitamin deficiencies need exclusion. - Order thyroid  function tests. - Order iron level tests. - Order vitamin D  level tests. - Order HIV test. - Monitor weight and appetite. - Consider gastroenterologist referral if weight loss persists.   General Health Maintenance Declined Hepatitis B vaccination.   Patient has been counseled on age-appropriate routine health concerns for screening and prevention. These are reviewed and up-to-date. Referrals have been placed accordingly. Immunizations are up-to-date or declined.    Subjective:   Chief Complaint  Patient presents with   Hypertension    Ronald Marsh 63 y.o. male presents to office today for follow up to HTN and with concerns of unintentional weight loss and decreased appetite.  He has a past medical history of DM2, Heart failure, Hepatitis C, History of CVA (02/2010), History of depression, Hyperlipidemia, Hypertension, Internal carotid artery stenosis, Stroke (2012), and Tobacco abuse.    He has experienced a weight loss of approximately 9 pounds over the past eight weeks, decreasing from 175 pounds in May to 166 pounds currently. This weight loss coincides with a decrease in appetite, as he is eating only once a day.  He reports spending a lot of time outside in the heat. This change in eating habits began  approximately two months ago, around May. No cough or fever. They have not had a recent HIV test but have been tested in the past.  He is not a current smoker and is up-to-date on his colonoscopy.  HTN Blood pressure is well-controlled on amlodipine , BiDil , carvedilol  and spironolactone .  He is also on Farxiga . BP Readings from Last 3 Encounters:  11/21/23 139/76  09/14/23 106/70  08/22/23 (!) 159/84     Review of Systems  Constitutional:  Positive for weight loss. Negative for fever and malaise/fatigue.  HENT: Negative.  Negative for nosebleeds.   Eyes: Negative.  Negative for blurred vision, double vision and photophobia.  Respiratory: Negative.  Negative for cough and shortness of breath.   Cardiovascular: Negative.  Negative for chest pain, palpitations and leg swelling.  Gastrointestinal: Negative.  Negative for heartburn, nausea and vomiting.  Musculoskeletal: Negative.  Negative for myalgias.  Neurological: Negative.  Negative for dizziness, focal weakness, seizures and headaches.  Psychiatric/Behavioral: Negative.  Negative for suicidal ideas.     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   History of depression    surrounding original stroke - has since resolved (04/2012)   Hyperlipidemia    Hypertension    Internal carotid artery stenosis    BL and mild per CT angiogram (05/2010)  Stroke (HCC) 2012   Tobacco abuse     Past Surgical History:  Procedure Laterality Date   NO PAST SURGERIES      Family History  Problem Relation Age of Onset   Diabetes Mother        on dialysis   Heart failure Mother    Hypertension Mother    Hyperlipidemia Mother    Glaucoma Mother    Lung cancer Father    Diabetes Sister    Colon polyps Sister     Arthritis Sister    Hypertension Brother    Diabetes Brother    Pancreatic cancer Brother    Colon cancer Neg Hx    Esophageal cancer Neg Hx     Social History Reviewed with no changes to be made today.   Outpatient Medications Prior to Visit  Medication Sig Dispense Refill   amLODipine  (NORVASC ) 10 MG tablet Take 1 tablet (10 mg total) by mouth daily. 90 tablet 1   atorvastatin  (LIPITOR ) 80 MG tablet TAKE 1 TABLET(80 MG) BY MOUTH DAILY 90 tablet 3   BIDIL  20-37.5 MG tablet TAKE 2 TABLETS BY MOUTH IN THE MORNING AND AT BEDTIME 120 tablet 0   carvedilol  (COREG ) 12.5 MG tablet Take 1 tablet (12.5 mg total) by mouth 2 (two) times daily with a meal. 180 tablet 1   clopidogrel  (PLAVIX ) 75 MG tablet Take 1 tablet (75 mg total) by mouth daily. 90 tablet 3   dapagliflozin  propanediol (FARXIGA ) 10 MG TABS tablet Take 1 tablet (10 mg total) by mouth daily. 90 tablet 1   metFORMIN  (GLUCOPHAGE ) 500 MG tablet TAKE 2 TABLETS BY MOUTH EVERY MORNING WITH BREAKFAST THEN TAKE 1 TABLET BY MOUTH WITH DINNER 270 tablet 1   potassium chloride  SA (KLOR-CON  M) 20 MEQ tablet Take 1 tablet (20 mEq total) by mouth daily. 90 tablet 3   spironolactone  (ALDACTONE ) 25 MG tablet TAKE 1 TABLET(25 MG) BY MOUTH DAILY 90 tablet 1   tamsulosin  (FLOMAX ) 0.4 MG CAPS capsule Take 1 capsule (0.4 mg total) by mouth daily. For urine stream 30 capsule 3   No facility-administered medications prior to visit.    Allergies  Allergen Reactions   Benicar [Olmesartan] Anaphylaxis and Swelling    Patient cannot name the site of swelling (??)   Ace Inhibitors Swelling    Patient cannot name the site of swelling (??)   Angiotensin Receptor Blockers Swelling    Patient cannot name the site of swelling (??)       Objective:    BP 139/76 (BP Location: Right Arm, Patient Position: Sitting, Cuff Size: Normal)   Pulse 71   Resp 19   Ht 6' 2.5 (1.892 m)   Wt 166 lb (75.3 kg)   SpO2 100%   BMI 21.03 kg/m  Wt Readings from Last 3  Encounters:  11/21/23 166 lb (75.3 kg)  09/14/23 175 lb 3.2 oz (79.5 kg)  08/22/23 181 lb (82.1 kg)    Physical Exam Vitals and nursing note reviewed.  Constitutional:      Appearance: He is well-developed.  HENT:     Head: Normocephalic and atraumatic.  Cardiovascular:     Rate and Rhythm: Normal rate and regular rhythm.     Heart sounds: Normal heart sounds. No murmur heard.    No friction rub. No gallop.  Pulmonary:     Effort: Pulmonary effort is normal. No tachypnea or respiratory distress.     Breath sounds: Normal breath sounds. No decreased breath sounds, wheezing,  rhonchi or rales.  Chest:     Chest wall: No tenderness.  Abdominal:     General: Bowel sounds are normal.     Palpations: Abdomen is soft.  Musculoskeletal:        General: Normal range of motion.     Cervical back: Normal range of motion.  Skin:    General: Skin is warm and dry.  Neurological:     Mental Status: He is alert and oriented to person, place, and time.     Coordination: Coordination normal.  Psychiatric:        Behavior: Behavior normal. Behavior is cooperative.        Thought Content: Thought content normal.        Judgment: Judgment normal.          Patient has been counseled extensively about nutrition and exercise as well as the importance of adherence with medications and regular follow-up. The patient was given clear instructions to go to ER or return to medical center if symptoms don't improve, worsen or new problems develop. The patient verbalized understanding.   Follow-up: Return in about 3 months (around 02/21/2024).   Haze LELON Servant, FNP-BC [facility] and [facility] [place], [place] [phone]   11/22/2023, 9:01 AM

## 2023-11-22 ENCOUNTER — Encounter: Payer: Self-pay | Admitting: Nurse Practitioner

## 2023-11-22 LAB — CBC WITH DIFFERENTIAL/PLATELET
Basophils Absolute: 0 x10E3/uL (ref 0.0–0.2)
Basos: 0 %
EOS (ABSOLUTE): 0.1 x10E3/uL (ref 0.0–0.4)
Eos: 2 %
Hematocrit: 43.9 % (ref 37.5–51.0)
Hemoglobin: 14.3 g/dL (ref 13.0–17.7)
Immature Grans (Abs): 0 x10E3/uL (ref 0.0–0.1)
Immature Granulocytes: 0 %
Lymphocytes Absolute: 1.9 x10E3/uL (ref 0.7–3.1)
Lymphs: 31 %
MCH: 31.2 pg (ref 26.6–33.0)
MCHC: 32.6 g/dL (ref 31.5–35.7)
MCV: 96 fL (ref 79–97)
Monocytes Absolute: 0.6 x10E3/uL (ref 0.1–0.9)
Monocytes: 9 %
Neutrophils Absolute: 3.4 x10E3/uL (ref 1.4–7.0)
Neutrophils: 58 %
Platelets: 258 x10E3/uL (ref 150–450)
RBC: 4.59 x10E6/uL (ref 4.14–5.80)
RDW: 13.3 % (ref 11.6–15.4)
WBC: 6 x10E3/uL (ref 3.4–10.8)

## 2023-11-22 LAB — VITAMIN D 25 HYDROXY (VIT D DEFICIENCY, FRACTURES): Vit D, 25-Hydroxy: 45.4 ng/mL (ref 30.0–100.0)

## 2023-11-22 LAB — THYROID PANEL WITH TSH
Free Thyroxine Index: 1.7 (ref 1.2–4.9)
T3 Uptake Ratio: 27 % (ref 24–39)
T4, Total: 6.4 ug/dL (ref 4.5–12.0)
TSH: 1.78 u[IU]/mL (ref 0.450–4.500)

## 2023-11-22 LAB — SEDIMENTATION RATE: Sed Rate: 26 mm/h (ref 0–30)

## 2023-11-22 LAB — HIV ANTIBODY (ROUTINE TESTING W REFLEX): HIV Screen 4th Generation wRfx: NONREACTIVE

## 2023-11-22 LAB — HEPATITIS C ANTIBODY: Hep C Virus Ab: REACTIVE — AB

## 2023-11-22 LAB — C-REACTIVE PROTEIN: CRP: <1 mg/L (ref 0–10)

## 2023-12-03 ENCOUNTER — Other Ambulatory Visit: Payer: Self-pay | Admitting: Nurse Practitioner

## 2023-12-03 DIAGNOSIS — I5033 Acute on chronic diastolic (congestive) heart failure: Secondary | ICD-10-CM

## 2023-12-03 DIAGNOSIS — E1165 Type 2 diabetes mellitus with hyperglycemia: Secondary | ICD-10-CM

## 2023-12-09 ENCOUNTER — Other Ambulatory Visit: Payer: Self-pay | Admitting: Physician Assistant

## 2023-12-09 DIAGNOSIS — I5033 Acute on chronic diastolic (congestive) heart failure: Secondary | ICD-10-CM

## 2023-12-11 NOTE — Telephone Encounter (Signed)
 Requested Prescriptions  Pending Prescriptions Disp Refills   BIDIL  20-37.5 MG tablet [Pharmacy Med Name: BIDIL  TABLETS] 360 tablet 1    Sig: TAKE 2 TABLETS BY MOUTH IN THE MORNING AND AT BEDTIME     Cardiovascular:  Vasodilators Failed - 12/11/2023  4:03 PM      Failed - ANA Screen, Ifa, Serum in normal range and within 360 days    No results found for: ANA, ANATITER, LABANTI       Passed - HCT in normal range and within 360 days    Hematocrit  Date Value Ref Range Status  11/21/2023 43.9 37.5 - 51.0 % Final         Passed - HGB in normal range and within 360 days    Hemoglobin  Date Value Ref Range Status  11/21/2023 14.3 13.0 - 17.7 g/dL Final         Passed - RBC in normal range and within 360 days    RBC  Date Value Ref Range Status  11/21/2023 4.59 4.14 - 5.80 x10E6/uL Final  05/16/2023 4.44 4.22 - 5.81 MIL/uL Final         Passed - WBC in normal range and within 360 days    WBC  Date Value Ref Range Status  11/21/2023 6.0 3.4 - 10.8 x10E3/uL Final  05/16/2023 5.5 4.0 - 10.5 K/uL Final         Passed - PLT in normal range and within 360 days    Platelets  Date Value Ref Range Status  11/21/2023 258 150 - 450 x10E3/uL Final         Passed - Last BP in normal range    BP Readings from Last 1 Encounters:  11/21/23 139/76         Passed - Valid encounter within last 12 months    Recent Outpatient Visits           2 weeks ago Primary hypertension   [facility] [place] [facility] [place]. [facility] Theotis Haze ORN, NP   3 months ago Essential hypertension   [facility] [place] [facility] Of [place]. [facility] Theotis Haze ORN, NP   6 months ago Hospital discharge follow-up   [facility] - A Dept Of [place]. [place], [place] W, NP   10 months ago Type 2 diabetes mellitus with hyperglycemia, without long-term current use of insulin  [facility])   [facility]  [place] [facility] Of [place]. [facility] [place], [place], [place]   1 year ago Controlled type 2 diabetes mellitus with diabetic polyneuropathy, without long-term current use of insulin  [facility])   [facility] [place] - [facility] Of [place]. [facility] Theotis Haze ORN, [place]

## 2023-12-16 ENCOUNTER — Ambulatory Visit: Payer: Self-pay | Admitting: Nurse Practitioner

## 2023-12-16 DIAGNOSIS — R768 Other specified abnormal immunological findings in serum: Secondary | ICD-10-CM

## 2023-12-22 ENCOUNTER — Ambulatory Visit: Attending: Nurse Practitioner

## 2023-12-22 DIAGNOSIS — R768 Other specified abnormal immunological findings in serum: Secondary | ICD-10-CM

## 2023-12-24 ENCOUNTER — Ambulatory Visit: Payer: Self-pay | Admitting: Nurse Practitioner

## 2023-12-24 LAB — HCV AB W REFLEX TO QUANT PCR: HCV Ab: REACTIVE — AB

## 2023-12-24 LAB — HCV RT-PCR, QUANT (NON-GRAPH): Hepatitis C Quantitation: NOT DETECTED [IU]/mL

## 2024-02-12 ENCOUNTER — Other Ambulatory Visit: Payer: Self-pay | Admitting: Nurse Practitioner

## 2024-02-12 DIAGNOSIS — I10 Essential (primary) hypertension: Secondary | ICD-10-CM

## 2024-02-21 ENCOUNTER — Ambulatory Visit: Attending: Nurse Practitioner | Admitting: Nurse Practitioner

## 2024-02-21 ENCOUNTER — Encounter: Payer: Self-pay | Admitting: Nurse Practitioner

## 2024-02-21 VITALS — BP 110/60 | HR 68 | Resp 20 | Ht 77.2 in | Wt 175.0 lb

## 2024-02-21 DIAGNOSIS — Z7984 Long term (current) use of oral hypoglycemic drugs: Secondary | ICD-10-CM | POA: Diagnosis not present

## 2024-02-21 DIAGNOSIS — Z23 Encounter for immunization: Secondary | ICD-10-CM | POA: Diagnosis not present

## 2024-02-21 DIAGNOSIS — I10 Essential (primary) hypertension: Secondary | ICD-10-CM | POA: Diagnosis not present

## 2024-02-21 DIAGNOSIS — Z79899 Other long term (current) drug therapy: Secondary | ICD-10-CM | POA: Diagnosis not present

## 2024-02-21 DIAGNOSIS — E1165 Type 2 diabetes mellitus with hyperglycemia: Secondary | ICD-10-CM

## 2024-02-21 MED ORDER — AMLODIPINE BESYLATE 10 MG PO TABS: 10.0000 mg | ORAL_TABLET | Freq: Every day | ORAL | 1 refills | Status: DC

## 2024-02-21 NOTE — Progress Notes (Signed)
 Assessment & Plan:  Skip was seen today for hypertension.  Diagnoses and all orders for this visit:  Primary hypertension -     CMP14+EGFR -     amLODipine  (NORVASC ) 10 MG tablet; Take 1 tablet (10 mg total) by mouth daily. Continue all antihypertensives as prescribed.  Reminded to bring in blood pressure log for follow  up appointment.  RECOMMENDATIONS: DASH/Mediterranean Diets are healthier choices for HTN.    Type 2 diabetes mellitus with hyperglycemia, without long-term current use of insulin  (HCC) -     Hemoglobin A1c -     CMP14+EGFR Current control adequate. - Order A1c test with upcoming blood work. Lab Results  Component Value Date   HGBA1C 6.0 08/22/2023    Need for influenza vaccination -     Flu vaccine trivalent PF, 6mos and older(Flulaval,Afluria,Fluarix,Fluzone)    Patient has been counseled on age-appropriate routine health concerns for screening and prevention. These are reviewed and up-to-date. Referrals have been placed accordingly. Immunizations are up-to-date or declined.    Subjective:   Chief Complaint  Patient presents with   Hypertension   History of Present Illness Ronald Marsh is a 63 year old male who presents for follow-up of hypertension with concerns about weight gain  He has a past medical history of DM2, Heart failure, Hepatitis C, History of CVA (02/2010), History of depression, Hyperlipidemia, Hypertension, Internal carotid artery stenosis, Stroke (2012), and Tobacco abuse.    Blood pressure is well-controlled with amlodipine  10 mg daily carvedilol  12.5 mg twice daily and spironolactone  25 mg daily BP Readings from Last 3 Encounters:  02/21/24 110/60  11/21/23 139/76  09/14/23 106/70    He is concerned about his current weight and not being able to gain enough weight to his satisfaction.  He does walk a lot instead of driving and I have instructed him that walking is likely the cause of his inability to gain weight due to  excessive cardio. BMI currently 22.5 Wt Readings from Last 3 Encounters:  02/21/24 175 lb (79.4 kg)  11/21/23 166 lb (75.3 kg)  09/14/23 175 lb 3.2 oz (79.5 kg)      He mentions a recent dental issue where a tooth fell out, which was already loose. He has a dentist appointment scheduled for the following day to assess the need for a partial plate.     Review of Systems  Constitutional:  Negative for fever, malaise/fatigue and weight loss.  HENT: Negative.  Negative for nosebleeds.   Eyes: Negative.  Negative for blurred vision, double vision and photophobia.  Respiratory: Negative.  Negative for cough and shortness of breath.   Cardiovascular: Negative.  Negative for chest pain, palpitations and leg swelling.  Gastrointestinal: Negative.  Negative for heartburn, nausea and vomiting.  Musculoskeletal: Negative.  Negative for myalgias.  Neurological: Negative.  Negative for dizziness, focal weakness, seizures and headaches.  Psychiatric/Behavioral: Negative.  Negative for suicidal ideas.     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   History of depression    surrounding original stroke - has since resolved (04/2012)   Hyperlipidemia    Hypertension    Internal carotid artery stenosis    BL and mild per CT angiogram (05/2010)   Stroke (HCC) 2012   Tobacco abuse  Past Surgical History:  Procedure Laterality Date   NO PAST SURGERIES      Family History  Problem Relation Age of Onset   Diabetes Mother        on dialysis   Heart failure Mother    Hypertension Mother    Hyperlipidemia Mother    Glaucoma Mother    Lung cancer Father    Diabetes Sister    Colon polyps Sister    Arthritis Sister    Hypertension Brother    Diabetes Brother     Pancreatic cancer Brother    Colon cancer Neg Hx    Esophageal cancer Neg Hx     Social History Reviewed with no changes to be made today.   Outpatient Medications Prior to Visit  Medication Sig Dispense Refill   atorvastatin  (LIPITOR ) 80 MG tablet TAKE 1 TABLET(80 MG) BY MOUTH DAILY 90 tablet 3   BIDIL  20-37.5 MG tablet TAKE 2 TABLETS BY MOUTH IN THE MORNING AND AT BEDTIME 360 tablet 1   carvedilol  (COREG ) 12.5 MG tablet Take 1 tablet (12.5 mg total) by mouth 2 (two) times daily with a meal. 180 tablet 1   clopidogrel  (PLAVIX ) 75 MG tablet Take 1 tablet (75 mg total) by mouth daily. 90 tablet 3   FARXIGA  10 MG TABS tablet TAKE 1 TABLET(10 MG) BY MOUTH DAILY 90 tablet 1   metFORMIN  (GLUCOPHAGE ) 500 MG tablet TAKE 2 TABLETS BY MOUTH EVERY MORNING WITH BREAKFAST THEN TAKE 1 TABLET BY MOUTH WITH DINNER 270 tablet 1   potassium chloride  SA (KLOR-CON  M) 20 MEQ tablet Take 1 tablet (20 mEq total) by mouth daily. 90 tablet 3   spironolactone  (ALDACTONE ) 25 MG tablet TAKE 1 TABLET(25 MG) BY MOUTH DAILY 90 tablet 1   tamsulosin  (FLOMAX ) 0.4 MG CAPS capsule Take 1 capsule (0.4 mg total) by mouth daily. For urine stream 30 capsule 3   amLODipine  (NORVASC ) 10 MG tablet TAKE 1 TABLET(10 MG) BY MOUTH DAILY 90 tablet 0   No facility-administered medications prior to visit.    Allergies  Allergen Reactions   Benicar [Olmesartan] Anaphylaxis and Swelling    Patient cannot name the site of swelling (??)   Ace Inhibitors Swelling    Patient cannot name the site of swelling (??)   Angiotensin Receptor Blockers Swelling    Patient cannot name the site of swelling (??)       Objective:    BP 110/60 (BP Location: Left Arm, Patient Position: Sitting, Cuff Size: Normal)   Pulse 68   Resp 20   Ht 6' 5.2 (1.961 m)   Wt 175 lb (79.4 kg)   SpO2 100%   BMI 20.64 kg/m  Wt Readings from Last 3 Encounters:  02/21/24 175 lb (79.4 kg)  11/21/23 166 lb (75.3 kg)  09/14/23 175 lb 3.2 oz (79.5 kg)     Physical Exam Vitals and nursing note reviewed.  Constitutional:      Appearance: He is well-developed.  HENT:     Head: Normocephalic and atraumatic.  Cardiovascular:     Rate and Rhythm: Normal rate and regular rhythm.     Heart sounds: Normal heart sounds. No murmur heard.    No friction rub. No gallop.  Pulmonary:     Effort: Pulmonary effort is normal. No tachypnea or respiratory distress.     Breath sounds: Normal breath sounds. No decreased breath sounds, wheezing, rhonchi or rales.  Chest:     Chest wall: No tenderness.  Musculoskeletal:  General: Normal range of motion.     Cervical back: Normal range of motion.  Skin:    General: Skin is warm and dry.  Neurological:     Mental Status: He is alert and oriented to person, place, and time.     Coordination: Coordination normal.  Psychiatric:        Behavior: Behavior normal. Behavior is cooperative.        Thought Content: Thought content normal.        Judgment: Judgment normal.          Patient has been counseled extensively about nutrition and exercise as well as the importance of adherence with medications and regular follow-up. The patient was given clear instructions to go to ER or return to medical center if symptoms don't improve, worsen or new problems develop. The patient verbalized understanding.   Follow-up: Return in about 3 months (around 06/06/2024).   Haze LELON Servant, FNP-BC [facility] [place], [place] [phone]   02/21/2024, 11:53 AM

## 2024-02-22 LAB — CMP14+EGFR
ALT: 8 IU/L (ref 0–44)
AST: 15 IU/L (ref 0–40)
Albumin: 4.5 g/dL (ref 3.8–4.9)
Alkaline Phosphatase: 62 IU/L (ref 47–123)
BUN/Creatinine Ratio: 15 (ref 10–24)
BUN: 13 mg/dL (ref 8–27)
Bilirubin Total: 0.3 mg/dL (ref 0.0–1.2)
CO2: 25 mmol/L (ref 20–29)
Calcium: 9.7 mg/dL (ref 8.6–10.2)
Chloride: 103 mmol/L (ref 96–106)
Creatinine, Ser: 0.87 mg/dL (ref 0.76–1.27)
Globulin, Total: 2.6 g/dL (ref 1.5–4.5)
Glucose: 84 mg/dL (ref 70–99)
Potassium: 4.7 mmol/L (ref 3.5–5.2)
Sodium: 142 mmol/L (ref 134–144)
Total Protein: 7.1 g/dL (ref 6.0–8.5)
eGFR: 99 mL/min/1.73 (ref >59)

## 2024-02-22 LAB — HEMOGLOBIN A1C
Est. average glucose Bld gHb Est-mCnc: 134 mg/dL
Hgb A1c MFr Bld: 6.3 % — ABNORMAL HIGH (ref 4.8–5.6)

## 2024-02-25 ENCOUNTER — Ambulatory Visit: Payer: Self-pay | Admitting: Nurse Practitioner

## 2024-03-08 ENCOUNTER — Other Ambulatory Visit: Payer: Self-pay | Admitting: Nurse Practitioner

## 2024-03-08 DIAGNOSIS — I10 Essential (primary) hypertension: Secondary | ICD-10-CM

## 2024-03-08 DIAGNOSIS — I5033 Acute on chronic diastolic (congestive) heart failure: Secondary | ICD-10-CM

## 2024-03-18 ENCOUNTER — Telehealth (HOSPITAL_COMMUNITY): Payer: Self-pay

## 2024-03-18 NOTE — Telephone Encounter (Signed)
 Medical clearance form faxed via Epic

## 2024-03-18 NOTE — Telephone Encounter (Signed)
  ADVANCED [facility] FAILURE [facility]   Pre-operative Risk Assessment   HEARTCARE STAFF-IMPORTANT INSTRUCTIONS 1 Red and Blue Text will auto delete once note is signed or closed. 2 Press F2 to navigate through template.   3 On drop down lists, L click to select >> R click to activate next field 4 Reason for Visit format is IMPORTANT!!  See Directions on No. 2 below. 5 Please review chart to determine if there is already a clearance note open for this procedure!!  DO NOT duplicate if a note already exists!!    :1}     Request for Surgical Clearance    Procedure:  Dental Extraction - Amount of Teeth to be Pulled:  5,4,84,83,81,69  Date of Surgery:  Clearance TBD                                 Surgeon:  Dr. Norleen Messier Surgeon's Group or Practice Name:  [facility]  Phone number:  [phone] Fax number:  [phone]   Type of Clearance Requested:   - Medical    Type of Anesthesia:  IV sedation    Additional requests/questions:  Please fax a copy of clearance to the surgeon's office.  Signed, Lisa CHRISTELLA Sergeant   03/18/2024, 9:01 AM   [facility] Harlene Gainer, FNP [facility] [address] [facility] [place] [address] [phone] (office) [phone] (fax)

## 2024-04-02 ENCOUNTER — Ambulatory Visit (HOSPITAL_BASED_OUTPATIENT_CLINIC_OR_DEPARTMENT_OTHER)
Admission: RE | Admit: 2024-04-02 | Discharge: 2024-04-02 | Disposition: A | Discharge status: Home / Self Care | Source: Ambulatory Visit | Attending: Family Medicine | Admitting: Family Medicine

## 2024-04-02 ENCOUNTER — Encounter (HOSPITAL_COMMUNITY): Payer: Self-pay

## 2024-04-02 ENCOUNTER — Ambulatory Visit (HOSPITAL_COMMUNITY)
Admission: RE | Admit: 2024-04-02 | Discharge: 2024-04-02 | Disposition: A | Discharge status: Home / Self Care | Source: Ambulatory Visit | Attending: Family Medicine | Admitting: Family Medicine

## 2024-04-02 VITALS — BP 135/92 | HR 68 | Ht 77.2 in | Wt 178.4 lb

## 2024-04-02 DIAGNOSIS — F1911 Other psychoactive substance abuse, in remission: Secondary | ICD-10-CM | POA: Insufficient documentation

## 2024-04-02 DIAGNOSIS — Z8619 Personal history of other infectious and parasitic diseases: Secondary | ICD-10-CM | POA: Diagnosis not present

## 2024-04-02 DIAGNOSIS — F1910 Other psychoactive substance abuse, uncomplicated: Secondary | ICD-10-CM | POA: Diagnosis not present

## 2024-04-02 DIAGNOSIS — Z7984 Long term (current) use of oral hypoglycemic drugs: Secondary | ICD-10-CM | POA: Diagnosis not present

## 2024-04-02 DIAGNOSIS — I639 Cerebral infarction, unspecified: Secondary | ICD-10-CM | POA: Diagnosis not present

## 2024-04-02 DIAGNOSIS — E119 Type 2 diabetes mellitus without complications: Secondary | ICD-10-CM | POA: Diagnosis not present

## 2024-04-02 DIAGNOSIS — Z79899 Other long term (current) drug therapy: Secondary | ICD-10-CM | POA: Insufficient documentation

## 2024-04-02 DIAGNOSIS — I358 Other nonrheumatic aortic valve disorders: Secondary | ICD-10-CM | POA: Diagnosis not present

## 2024-04-02 DIAGNOSIS — Z8673 Personal history of transient ischemic attack (TIA), and cerebral infarction without residual deficits: Secondary | ICD-10-CM | POA: Diagnosis not present

## 2024-04-02 DIAGNOSIS — I110 Hypertensive heart disease with heart failure: Secondary | ICD-10-CM | POA: Diagnosis not present

## 2024-04-02 DIAGNOSIS — I5022 Chronic systolic (congestive) heart failure: Secondary | ICD-10-CM | POA: Diagnosis not present

## 2024-04-02 DIAGNOSIS — I10 Essential (primary) hypertension: Secondary | ICD-10-CM

## 2024-04-02 DIAGNOSIS — B1920 Unspecified viral hepatitis C without hepatic coma: Secondary | ICD-10-CM

## 2024-04-02 MED ORDER — BIDIL 20-37.5 MG PO TABS: 2.0000 | ORAL_TABLET | Freq: Three times a day (TID) | ORAL | 11 refills | Status: DC

## 2024-04-02 MED ORDER — BIDIL 20-37.5 MG PO TABS
2.0000 | ORAL_TABLET | Freq: Three times a day (TID) | ORAL | 11 refills | Status: AC
Start: 2024-04-02 — End: ?

## 2024-04-02 NOTE — Progress Notes (Signed)
 ADVANCED HF CLINIC NOTE  PCP: Theotis Haze ORN, NP HF Cardiologist: Dr. Cherrie  HPI: Ronald Marsh is a 63 y.o. male with multiple medical problems including substance abuse (tobacco, ETOH cocaine), HCV (s/p treatment), HTN, HL, DM2, hx CVA in 2011 and h/o systolic HF dating back to 2011. Echo 02/2010 with EF 50-55%.  Admitted to [facility] 9/19 with acute HF. EF 25-30%. Had cath. No CAD.   Admitted to [facility] 12/19 with recurrent HF and HTN. Echo 07/03/18 EF 25-30% Had angioedema with Benicar so not on ACE/ARNI/ARB  Echo 12/20 EF 40-45% RV ok    Echo 01/20/2020 EF 45-50% RV ok  Echo 01/07/22: EF 50-55%, LV cavity trabeculated with inferior basal/apical hypokinesis, RV okay  cMRI 6/24 EF 53% RV ok. No LGE. Mild trabeculaltions (ratio 1.7 - criteria for LVNC >= 2.3)   Today he returns for HF follow up. Overall feeling fine. He has rare dizziness, no undue dyspnea. Denies palpitations, abnormal bleeding, CP, edema, or PND/Orthopnea. Appetite ok. Does not weigh at home. Taking all medications, takes BiDil  bid. Denies any recent ETOH, tobacco or cocaine use. UDS 4/24 negative cocaine.  Echo today 04/02/24, images reviewed with Dr. Zenaida, EF 55%  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   History of depression    surrounding original stroke - has since resolved (04/2012)   Hyperlipidemia    Hypertension    Internal carotid artery stenosis    BL and mild per CT angiogram (05/2010)   Stroke (HCC) 2012   Tobacco abuse    Current Outpatient Medications  Medication Sig Dispense Refill   amLODipine  (NORVASC ) 10 MG tablet Take 1 tablet (10 mg total) by mouth daily. 90 tablet 1   atorvastatin  (LIPITOR ) 80 MG tablet TAKE 1 TABLET(80 MG) BY MOUTH DAILY 90 tablet 3   BIDIL   20-37.5 MG tablet TAKE 2 TABLETS BY MOUTH IN THE MORNING AND AT BEDTIME 360 tablet 1   carvedilol  (COREG ) 12.5 MG tablet TAKE 1 TABLET(12.5 MG) BY MOUTH TWICE DAILY WITH A MEAL 180 tablet 1   clopidogrel  (PLAVIX ) 75 MG tablet Take 1 tablet (75 mg total) by mouth daily. 90 tablet 3   FARXIGA  10 MG TABS tablet TAKE 1 TABLET(10 MG) BY MOUTH DAILY 90 tablet 1   metFORMIN  (GLUCOPHAGE ) 500 MG tablet TAKE 2 TABLETS BY MOUTH EVERY MORNING WITH BREAKFAST THEN TAKE 1 TABLET BY MOUTH WITH DINNER 270 tablet 1   potassium chloride  SA (KLOR-CON  M) 20 MEQ tablet Take 1 tablet (20 mEq total) by mouth daily. 90 tablet 3   spironolactone  (ALDACTONE ) 25 MG tablet TAKE 1 TABLET(25 MG) BY MOUTH DAILY 90 tablet 1   tamsulosin  (FLOMAX ) 0.4 MG CAPS capsule Take 1 capsule (0.4 mg total) by mouth daily. For urine stream 30 capsule 3   No current facility-administered medications for this encounter.   Allergies  Allergen Reactions   Benicar [Olmesartan] Anaphylaxis and Swelling    Patient cannot name the site of swelling (??)   Ace Inhibitors Swelling    Patient cannot name the site of swelling (??)   Angiotensin Receptor Blockers Swelling    Patient cannot name the site of swelling (??)   Social History   Socioeconomic History   Marital status: Single    Spouse name:  Not on file   Number of children: 1   Years of education: 11th grade   Highest education level: Not on file  Occupational History   Occupation: On disability    Comment: previously worked for [employer] until his stroke in 2011.   Tobacco Use   Smoking status: Former    Current packs/day: 0.00    Average packs/day: 0.3 packs/day for 40.1 years (12.0 ttl pk-yrs)    Types: Cigarettes    Start date: 04/25/1981    Quit date: 05/27/2021    Years since quitting: 2.8   Smokeless tobacco: Never  Vaping Use   Vaping status: Never Used  Substance and Sexual Activity   Alcohol use: Not Currently   Drug use: Yes    Types: Marijuana     Comment: Former smoke crack    Sexual activity: Yes  Other Topics Concern   Not on file  Social History Narrative   Lives in [place] with wife.   Has been incarcerated multiple times, last time was in 2012 for 8 months.   Social Drivers of Corporate Investment Banker Strain: Not on file  Food Insecurity: No Food Insecurity (01/22/2021)   Hunger Vital Sign    Worried About Running Out of Food in the Last Year: Never true    Ran Out of Food in the Last Year: Never true  Transportation Needs: Not on file  Physical Activity: Not on file  Stress: Not on file  Social Connections: Not on file  Intimate Partner Violence: Not on file   Family History  Problem Relation Age of Onset   Diabetes Mother        on dialysis   Heart failure Mother    Hypertension Mother    Hyperlipidemia Mother    Glaucoma Mother    Lung cancer Father    Diabetes Sister    Colon polyps Sister    Arthritis Sister    Hypertension Brother    Diabetes Brother    Pancreatic cancer Brother    Colon cancer Neg Hx    Esophageal cancer Neg Hx    Wt Readings from Last 3 Encounters:  04/02/24 80.9 kg (178 lb 6.4 oz)  02/21/24 79.4 kg (175 lb)  11/21/23 75.3 kg (166 lb)   BP (!) 135/92   Pulse 68   Ht 6' 5.2 (1.961 m)   Wt 80.9 kg (178 lb 6.4 oz)   SpO2 98%   BMI 21.05 kg/m   PHYSICAL EXAM: General:  NAD. No resp difficulty, walked into clinic HEENT: Normal Neck: Supple. No JVD. Cor: Regular rate & rhythm. No rubs, gallops or murmurs. Lungs: Clear Abdomen: Soft, nontender, nondistended.  Extremities: No cyanosis, clubbing, rash, edema Neuro: Alert & oriented x 3, moves all 4 extremities w/o difficulty. Affect pleasant.  ECG (personally reviewed):  NSR rBBB 63 bpm  ASSESSMENT & PLAN:  1. Chronic systolic HF due to NICM - Echo 9/19: EF 25-30% - Echo 2/20: EF 25-30 - Cath 9/19 [facility]. No CAD - Echo 12/20: EF improved to 40-45% - Echo 9/21: EF 45-50%.  RV okay - Echo 9/23: EF 50-55%, LV  cavity trabeculated, RV okay - cMRI 6/24 EF 53% RV ok. No LGE. Mild trabeculations (ratio 1.7 - criteria for LVNC >= 2.3)  - Previously suspected due to HTN and substance use - Echo today 04/02/24, EF 55%, reviewed with Dr. Zenaida and patient today. - Doing well NYHA I- early II. Volume ok today. - Increase BiDil  to 2  tabs tid - Continue carvedilol  12.5 mg bid - Continue spiro 25 mg daily. - Continue Farxiga  10 mg daily - No ACE/ARB/ARNI with angioedema to Benicar in past - Denies further use of ETOH or cocaine - cMRI shows LV recovery and does not meet diagnostic criteria for LVNC. Continue to follow  - Encouraged him to be more active - Labs from PCP visit 02/21/24 reviewed and are stable, K 4.7, SCr 0.87  2. HTN - BP mildly elevated - Change BiDil  to 2 tab tid - I asked him to check BP daily, notify clinic if sBP consistently > 140  3. Substance abuse - Denies ETOH/cocaine. Occasional THC use.  - Congratulated him on remaining quit  4. CVA - On DAPT + statin.  - Suspect cardio-embolic in setting of previous low EF. cMRI does not meet criteria for LVNC and EF has recovered, so will not switch to DOAC  - Minimal residual deficit  - No change  5. HCV - Treated. last VL undetectable   Doing well! Follow up in 6 months with Dr. Cherrie  Ronald CHRISTELLA Gainer, FNP  2:04 PM

## 2024-04-02 NOTE — Patient Instructions (Addendum)
 Thank you for coming in today  If you had labs drawn today, any labs that are abnormal the clinic will call you No news is good news  Check Blood pressure and log notify clinic if Systolic(top) number is consistently over 140.  Medications: Change Bidil  to 2 tablets 3 times daily   Follow up appointments:  Your physician recommends that you schedule a follow-up appointment in:  6 months With Dr. Cherrie Please call our office to schedule the follow-up appointment in March 2026 for May 2026.    Do the following things EVERYDAY: Weigh yourself in the morning before breakfast. Write it down and keep it in a log. Take your medicines as prescribed Eat low salt foods--Limit salt (sodium) to 2000 mg per day.  Stay as active as you can everyday Limit all fluids for the day to less than 2 liters   At the [facility], you and your health needs are our priority. As part of our continuing mission to provide you with exceptional heart care, we have created designated Provider Care Teams. These Care Teams include your primary Cardiologist (physician) and Advanced Practice Providers (APPs- Physician Assistants and Nurse Practitioners) who all work together to provide you with the care you need, when you need it.   You may see any of the following providers on your designated Care Team at your next follow up: Dr Toribio Cherrie Dr Ezra Shuck Dr. Ria Gardenia Greig Lenetta, NP Caffie Shed, [place] [facility] [place], [place] Beckey Coe, NP Tinnie Redman, PharmD   Please be sure to bring in all your medications bottles to every appointment.    Thank you for choosing [facility]  If you have any questions or concerns before your next appointment please send us  a message through [place] or call our office at [phone].    TO LEAVE A MESSAGE FOR THE NURSE SELECT OPTION 2, PLEASE LEAVE A MESSAGE INCLUDING: YOUR  NAME DATE OF BIRTH CALL BACK NUMBER REASON FOR CALL**this is important as we prioritize the call backs  YOU WILL RECEIVE A CALL BACK THE SAME DAY AS LONG AS YOU CALL BEFORE 4:00 PM

## 2024-04-02 NOTE — Addendum Note (Signed)
 Encounter addended by: Jerona Dalton HERO, CMA on: 04/02/2024 4:47 PM  Actions taken: Order list changed, Diagnosis association updated

## 2024-04-03 LAB — ECHOCARDIOGRAM COMPLETE
AR max vel: 3.58 cm2
AV Area VTI: 3.34 cm2
AV Area mean vel: 3.28 cm2
AV Mean grad: 4 mmHg
AV Peak grad: 7.8 mmHg
Ao pk vel: 1.4 m/s
Area-P 1/2: 4.68 cm2
Calc EF: 55.6 %
S' Lateral: 3.4 cm
Single Plane A2C EF: 56.7 %
Single Plane A4C EF: 57.9 %

## 2024-04-05 ENCOUNTER — Ambulatory Visit (HOSPITAL_COMMUNITY): Payer: Self-pay | Admitting: Family Medicine

## 2024-05-13 HISTORY — PX: MOUTH SURGERY: SHX715

## 2024-05-22 ENCOUNTER — Telehealth: Payer: Self-pay | Admitting: Nurse Practitioner

## 2024-05-22 NOTE — Telephone Encounter (Signed)
 Contacted pt left vm to confirmed appt (per vr)

## 2024-05-24 ENCOUNTER — Encounter: Payer: Self-pay | Admitting: Nurse Practitioner

## 2024-05-24 ENCOUNTER — Other Ambulatory Visit: Payer: Self-pay | Admitting: Nurse Practitioner

## 2024-05-24 ENCOUNTER — Ambulatory Visit: Attending: Nurse Practitioner | Admitting: Nurse Practitioner

## 2024-05-24 VITALS — BP 95/59 | HR 76 | Ht 77.2 in | Wt 185.6 lb

## 2024-05-24 DIAGNOSIS — E1165 Type 2 diabetes mellitus with hyperglycemia: Secondary | ICD-10-CM

## 2024-05-24 DIAGNOSIS — E1142 Type 2 diabetes mellitus with diabetic polyneuropathy: Secondary | ICD-10-CM

## 2024-05-24 DIAGNOSIS — I10 Essential (primary) hypertension: Secondary | ICD-10-CM

## 2024-05-24 DIAGNOSIS — E7800 Pure hypercholesterolemia, unspecified: Secondary | ICD-10-CM

## 2024-05-24 DIAGNOSIS — R350 Frequency of micturition: Secondary | ICD-10-CM

## 2024-05-24 NOTE — Progress Notes (Unsigned)
 "  Assessment & Plan:  Aryan was seen today for hypertension.  Diagnoses and all orders for this visit:  Type 2 diabetes mellitus with hyperglycemia, without long-term current use of insulin  [facility])    Patient has been counseled on age-appropriate routine health concerns for screening and prevention. These are reviewed and up-to-date. Referrals have been placed accordingly. Immunizations are up-to-date or declined.    Subjective:   Chief Complaint  Patient presents with   Hypertension    Ronald Marsh 64 y.o. male presents to office today for follow up  BP Readings from Last 3 Encounters:  05/24/24 (!) 95/59  04/02/24 (!) 135/92  02/21/24 110/60      Lab Results  Component Value Date   HGBA1C 6.3 (H) 02/21/2024      ROS  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   History of depression    surrounding original stroke - has since resolved (04/2012)   Hyperlipidemia    Hypertension    Internal carotid artery stenosis    BL and mild per CT angiogram (05/2010)   Stroke (HCC) 2012   Tobacco abuse     Past Surgical History:  Procedure Laterality Date   MOUTH SURGERY  05/13/2024   NO PAST SURGERIES      Family History  Problem Relation Age of Onset   Diabetes Mother        on dialysis   Heart failure Mother    Hypertension Mother    Hyperlipidemia Mother    Glaucoma Mother    Lung cancer Father    Diabetes Sister    Colon polyps Sister    Arthritis Sister    Hypertension Brother    Diabetes Brother    Pancreatic cancer Brother    Colon cancer Neg Hx    Esophageal cancer Neg Hx     Social History Reviewed with no changes to be made today.   Outpatient Medications Prior to Visit  Medication Sig Dispense Refill   amLODipine   (NORVASC ) 10 MG tablet Take 1 tablet (10 mg total) by mouth daily. 90 tablet 1   atorvastatin  (LIPITOR ) 80 MG tablet TAKE 1 TABLET(80 MG) BY MOUTH DAILY 90 tablet 3   BIDIL  20-37.5 MG tablet Take 2 tablets by mouth 3 (three) times daily. 180 tablet 11   carvedilol  (COREG ) 12.5 MG tablet TAKE 1 TABLET(12.5 MG) BY MOUTH TWICE DAILY WITH A MEAL 180 tablet 1   clopidogrel  (PLAVIX ) 75 MG tablet Take 1 tablet (75 mg total) by mouth daily. 90 tablet 3   FARXIGA  10 MG TABS tablet TAKE 1 TABLET(10 MG) BY MOUTH DAILY 90 tablet 1   metFORMIN  (GLUCOPHAGE ) 500 MG tablet TAKE 2 TABLETS BY MOUTH EVERY MORNING WITH BREAKFAST THEN TAKE 1 TABLET BY MOUTH WITH DINNER 270 tablet 1   potassium chloride  SA (KLOR-CON  M) 20 MEQ tablet Take 1 tablet (20 mEq total) by mouth daily. 90 tablet 3   spironolactone  (ALDACTONE ) 25 MG tablet TAKE 1 TABLET(25 MG) BY MOUTH DAILY 90 tablet 1   tamsulosin  (FLOMAX ) 0.4 MG CAPS capsule Take 1 capsule (0.4 mg total) by mouth daily. For urine stream 30 capsule 3   No facility-administered medications prior to visit.    Allergies[1]     Objective:    BP ROLLEN)  95/59 (BP Location: Left Arm, Patient Position: Sitting, Cuff Size: Normal)   Pulse 76   Ht 6' 5.2 (1.961 m)   Wt 185 lb 9.6 oz (84.2 kg)   SpO2 98%   BMI 21.90 kg/m  Wt Readings from Last 3 Encounters:  05/24/24 185 lb 9.6 oz (84.2 kg)  04/02/24 178 lb 6.4 oz (80.9 kg)  02/21/24 175 lb (79.4 kg)    Physical Exam       Patient has been counseled extensively about nutrition and exercise as well as the importance of adherence with medications and regular follow-up. The patient was given clear instructions to go to ER or return to medical center if symptoms don't improve, worsen or new problems develop. The patient verbalized understanding.   Follow-up: No follow-ups on file.   Haze LELON Servant, FNP-BC [facility] [place], [place] [phone]   05/24/2024, 10:16 AM    [1]   Allergies Allergen Reactions   Benicar [Olmesartan] Anaphylaxis and Swelling    Patient cannot name the site of swelling (??)   Ace Inhibitors Swelling    Patient cannot name the site of swelling (??)   Angiotensin Receptor Blockers Swelling    Patient cannot name the site of swelling (??)   "

## 2024-05-25 ENCOUNTER — Ambulatory Visit: Payer: Self-pay | Admitting: Nurse Practitioner

## 2024-05-25 ENCOUNTER — Other Ambulatory Visit (HOSPITAL_COMMUNITY)
Admission: RE | Admit: 2024-05-25 | Discharge: 2024-05-25 | Disposition: A | Source: Ambulatory Visit | Attending: Nurse Practitioner | Admitting: Nurse Practitioner

## 2024-05-25 DIAGNOSIS — R972 Elevated prostate specific antigen [PSA]: Secondary | ICD-10-CM | POA: Insufficient documentation

## 2024-05-25 LAB — LIPID PANEL
Chol/HDL Ratio: 4.5 ratio (ref 0.0–5.0)
Cholesterol, Total: 205 mg/dL — ABNORMAL HIGH (ref 100–199)
HDL: 46 mg/dL
LDL Chol Calc (NIH): 125 mg/dL — ABNORMAL HIGH (ref 0–99)
Triglycerides: 192 mg/dL — ABNORMAL HIGH (ref 0–149)
VLDL Cholesterol Cal: 34 mg/dL (ref 5–40)

## 2024-05-25 LAB — CMP14+EGFR
ALT: 12 IU/L (ref 0–44)
AST: 16 IU/L (ref 0–40)
Albumin: 4.2 g/dL (ref 3.9–4.9)
Alkaline Phosphatase: 70 IU/L (ref 47–123)
BUN/Creatinine Ratio: 14 (ref 10–24)
BUN: 11 mg/dL (ref 8–27)
Bilirubin Total: <0.2 mg/dL (ref 0.0–1.2)
CO2: 22 mmol/L (ref 20–29)
Calcium: 9.6 mg/dL (ref 8.6–10.2)
Chloride: 104 mmol/L (ref 96–106)
Creatinine, Ser: 0.79 mg/dL (ref 0.76–1.27)
Globulin, Total: 2.7 g/dL (ref 1.5–4.5)
Glucose: 89 mg/dL (ref 70–99)
Potassium: 4.8 mmol/L (ref 3.5–5.2)
Sodium: 141 mmol/L (ref 134–144)
Total Protein: 6.9 g/dL (ref 6.0–8.5)
eGFR: 101 mL/min/1.73

## 2024-05-25 LAB — MICROALBUMIN / CREATININE URINE RATIO
Creatinine, Urine: 86.1 mg/dL
Microalb/Creat Ratio: <3 mg/g{creat} (ref 0–29)
Microalbumin, Urine: <3 ug/mL

## 2024-05-25 LAB — PSA: Prostate Specific Ag, Serum: 7.6 ng/mL — ABNORMAL HIGH (ref 0.0–4.0)

## 2024-05-27 ENCOUNTER — Ambulatory Visit: Payer: Self-pay

## 2024-05-30 ENCOUNTER — Other Ambulatory Visit: Payer: Self-pay | Admitting: Family Medicine

## 2024-05-30 DIAGNOSIS — I10 Essential (primary) hypertension: Secondary | ICD-10-CM

## 2024-06-05 ENCOUNTER — Telehealth: Payer: Self-pay

## 2024-06-05 ENCOUNTER — Other Ambulatory Visit: Payer: Self-pay | Admitting: Nurse Practitioner

## 2024-06-05 ENCOUNTER — Ambulatory Visit: Attending: Nurse Practitioner

## 2024-06-05 DIAGNOSIS — I10 Essential (primary) hypertension: Secondary | ICD-10-CM

## 2024-06-05 DIAGNOSIS — R972 Elevated prostate specific antigen [PSA]: Secondary | ICD-10-CM

## 2024-06-05 NOTE — Progress Notes (Signed)
 Complex Care Management Note  Care Guide Note 06/05/2024 Name: Ronald Marsh [name]: [id number] DOB: 08-Nov-1960  Ronald Marsh is a 64 y.o. year old male who sees Theotis Haze ORN, NP for primary care. I reached out to Donnice Lesches by phone today to offer complex care management services.  Mr. Feeny was given information about Complex Care Management services today including:   The Complex Care Management services include support from the care team which includes your Nurse Care Manager, Clinical Social Worker, or Pharmacist.  The Complex Care Management team is here to help remove barriers to the health concerns and goals most important to you. Complex Care Management services are voluntary, and the patient may decline or stop services at any time by request to their care team member.   Complex Care Management Consent Status: Patient agreed to services and verbal consent obtained.   Follow up plan:  Telephone appointment with complex care management team member scheduled for:  07/01/2024  Encounter Outcome:  Patient Scheduled   Jon Colt [facility]  [facility] Guide, Phone: [phone] Fax: [phone] Website: [email or web address]

## 2024-06-06 LAB — URINE CYTOLOGY ANCILLARY ONLY
Chlamydia: NEGATIVE
Comment: NEGATIVE
Comment: NEGATIVE
Comment: NORMAL
Neisseria Gonorrhea: NEGATIVE
Trichomonas: NEGATIVE

## 2024-06-06 LAB — PSA: Prostate Specific Ag, Serum: 9.1 ng/mL — ABNORMAL HIGH (ref 0.0–4.0)

## 2024-06-09 ENCOUNTER — Ambulatory Visit: Payer: Self-pay | Admitting: Nurse Practitioner

## 2024-06-09 DIAGNOSIS — R972 Elevated prostate specific antigen [PSA]: Secondary | ICD-10-CM

## 2024-07-01 ENCOUNTER — Telehealth: Admitting: Licensed Clinical Social Worker

## 2024-09-06 ENCOUNTER — Ambulatory Visit: Payer: Self-pay | Admitting: Nurse Practitioner
